# Patient Record
Sex: Male | Born: 1945 | Race: White | Hispanic: No | Marital: Married | Smoking: Former smoker
Health system: Southern US, Community
[De-identification: ages and names within clinical notes are randomized; demographics above are authoritative.]

## PROBLEM LIST (undated history)

## (undated) DIAGNOSIS — F32A Depression, unspecified: Secondary | ICD-10-CM

## (undated) DIAGNOSIS — C61 Malignant neoplasm of prostate: Secondary | ICD-10-CM

## (undated) DIAGNOSIS — I10 Essential (primary) hypertension: Secondary | ICD-10-CM

## (undated) DIAGNOSIS — M109 Gout, unspecified: Secondary | ICD-10-CM

## (undated) DIAGNOSIS — N442 Benign cyst of testis: Secondary | ICD-10-CM

## (undated) DIAGNOSIS — F329 Major depressive disorder, single episode, unspecified: Secondary | ICD-10-CM

## (undated) DIAGNOSIS — M1990 Unspecified osteoarthritis, unspecified site: Secondary | ICD-10-CM

## (undated) DIAGNOSIS — F0390 Unspecified dementia without behavioral disturbance: Secondary | ICD-10-CM

## (undated) DIAGNOSIS — F419 Anxiety disorder, unspecified: Secondary | ICD-10-CM

## (undated) DIAGNOSIS — B019 Varicella without complication: Secondary | ICD-10-CM

## (undated) DIAGNOSIS — Z87891 Personal history of nicotine dependence: Principal | ICD-10-CM

## (undated) HISTORY — DX: Personal history of nicotine dependence: Z87.891

## (undated) HISTORY — DX: Gout, unspecified: M10.9

## (undated) HISTORY — DX: Major depressive disorder, single episode, unspecified: F32.9

## (undated) HISTORY — DX: Unspecified dementia, unspecified severity, without behavioral disturbance, psychotic disturbance, mood disturbance, and anxiety: F03.90

## (undated) HISTORY — DX: Depression, unspecified: F32.A

## (undated) HISTORY — DX: Varicella without complication: B01.9

## (undated) HISTORY — PX: KNEE ARTHROSCOPY: SUR90

## (undated) HISTORY — DX: Unspecified osteoarthritis, unspecified site: M19.90

## (undated) HISTORY — DX: Benign cyst of testis: N44.2

## (undated) HISTORY — PX: NASAL SINUS SURGERY: SHX719

---

## 2004-04-06 ENCOUNTER — Inpatient Hospital Stay: Payer: Self-pay | Admitting: Unknown Physician Specialty

## 2006-07-13 ENCOUNTER — Ambulatory Visit: Payer: Self-pay | Admitting: Internal Medicine

## 2006-08-02 ENCOUNTER — Ambulatory Visit: Payer: Self-pay | Admitting: Internal Medicine

## 2006-09-02 ENCOUNTER — Ambulatory Visit: Payer: Self-pay | Admitting: Internal Medicine

## 2006-10-02 ENCOUNTER — Ambulatory Visit: Payer: Self-pay | Admitting: Internal Medicine

## 2006-10-12 ENCOUNTER — Ambulatory Visit: Payer: Self-pay | Admitting: Internal Medicine

## 2006-11-02 ENCOUNTER — Ambulatory Visit: Payer: Self-pay | Admitting: Internal Medicine

## 2006-12-03 ENCOUNTER — Ambulatory Visit: Payer: Self-pay | Admitting: Internal Medicine

## 2007-01-02 ENCOUNTER — Ambulatory Visit: Payer: Self-pay | Admitting: Internal Medicine

## 2007-01-04 ENCOUNTER — Ambulatory Visit: Payer: Self-pay | Admitting: Internal Medicine

## 2007-02-02 ENCOUNTER — Ambulatory Visit: Payer: Self-pay | Admitting: Internal Medicine

## 2007-03-04 ENCOUNTER — Ambulatory Visit: Payer: Self-pay | Admitting: Oncology

## 2007-03-29 ENCOUNTER — Ambulatory Visit: Payer: Self-pay | Admitting: Oncology

## 2007-04-04 ENCOUNTER — Ambulatory Visit: Payer: Self-pay | Admitting: Oncology

## 2007-05-05 ENCOUNTER — Ambulatory Visit: Payer: Self-pay | Admitting: Oncology

## 2007-05-31 ENCOUNTER — Ambulatory Visit: Payer: Self-pay | Admitting: Gastroenterology

## 2007-06-02 ENCOUNTER — Ambulatory Visit: Payer: Self-pay | Admitting: Oncology

## 2007-06-02 ENCOUNTER — Ambulatory Visit: Payer: Self-pay | Admitting: Internal Medicine

## 2007-07-03 ENCOUNTER — Ambulatory Visit: Payer: Self-pay | Admitting: Oncology

## 2007-07-03 ENCOUNTER — Ambulatory Visit: Payer: Self-pay | Admitting: Internal Medicine

## 2007-08-02 ENCOUNTER — Ambulatory Visit: Payer: Self-pay | Admitting: Oncology

## 2007-08-02 ENCOUNTER — Ambulatory Visit: Payer: Self-pay | Admitting: Internal Medicine

## 2007-09-02 ENCOUNTER — Ambulatory Visit: Payer: Self-pay | Admitting: Internal Medicine

## 2007-09-02 ENCOUNTER — Ambulatory Visit: Payer: Self-pay | Admitting: Oncology

## 2007-10-02 ENCOUNTER — Ambulatory Visit: Payer: Self-pay | Admitting: Oncology

## 2007-10-02 ENCOUNTER — Ambulatory Visit: Payer: Self-pay | Admitting: Internal Medicine

## 2007-11-02 ENCOUNTER — Ambulatory Visit: Payer: Self-pay | Admitting: Internal Medicine

## 2007-11-08 ENCOUNTER — Ambulatory Visit: Payer: Self-pay | Admitting: Internal Medicine

## 2007-12-03 ENCOUNTER — Ambulatory Visit: Payer: Self-pay | Admitting: Internal Medicine

## 2008-01-02 ENCOUNTER — Ambulatory Visit: Payer: Self-pay | Admitting: Internal Medicine

## 2008-02-02 ENCOUNTER — Ambulatory Visit: Payer: Self-pay | Admitting: Internal Medicine

## 2008-02-14 ENCOUNTER — Ambulatory Visit: Payer: Self-pay | Admitting: Internal Medicine

## 2008-03-03 ENCOUNTER — Ambulatory Visit: Payer: Self-pay | Admitting: Internal Medicine

## 2008-04-03 ENCOUNTER — Ambulatory Visit: Payer: Self-pay | Admitting: Internal Medicine

## 2008-05-04 ENCOUNTER — Ambulatory Visit: Payer: Self-pay | Admitting: Internal Medicine

## 2008-05-15 ENCOUNTER — Ambulatory Visit: Payer: Self-pay | Admitting: Internal Medicine

## 2008-06-01 ENCOUNTER — Ambulatory Visit: Payer: Self-pay | Admitting: Internal Medicine

## 2008-07-02 ENCOUNTER — Ambulatory Visit: Payer: Self-pay | Admitting: Internal Medicine

## 2008-08-01 ENCOUNTER — Ambulatory Visit: Payer: Self-pay | Admitting: Internal Medicine

## 2008-08-12 ENCOUNTER — Ambulatory Visit: Payer: Self-pay | Admitting: Internal Medicine

## 2008-09-01 ENCOUNTER — Ambulatory Visit: Payer: Self-pay | Admitting: Internal Medicine

## 2008-10-01 ENCOUNTER — Ambulatory Visit: Payer: Self-pay | Admitting: Internal Medicine

## 2008-10-13 ENCOUNTER — Ambulatory Visit: Payer: Self-pay | Admitting: Internal Medicine

## 2008-11-01 ENCOUNTER — Ambulatory Visit: Payer: Self-pay | Admitting: Internal Medicine

## 2008-12-02 ENCOUNTER — Ambulatory Visit: Payer: Self-pay | Admitting: Internal Medicine

## 2008-12-08 ENCOUNTER — Ambulatory Visit: Payer: Self-pay | Admitting: Internal Medicine

## 2009-01-01 ENCOUNTER — Ambulatory Visit: Payer: Self-pay | Admitting: Internal Medicine

## 2009-02-01 ENCOUNTER — Ambulatory Visit: Payer: Self-pay | Admitting: Internal Medicine

## 2009-02-02 ENCOUNTER — Ambulatory Visit: Payer: Self-pay | Admitting: Internal Medicine

## 2009-03-03 ENCOUNTER — Ambulatory Visit: Payer: Self-pay | Admitting: Internal Medicine

## 2009-04-03 LAB — HM COLONOSCOPY

## 2009-05-04 ENCOUNTER — Ambulatory Visit: Payer: Self-pay | Admitting: Internal Medicine

## 2009-05-05 ENCOUNTER — Ambulatory Visit: Payer: Self-pay | Admitting: Internal Medicine

## 2009-06-01 ENCOUNTER — Ambulatory Visit: Payer: Self-pay | Admitting: Internal Medicine

## 2010-01-13 ENCOUNTER — Ambulatory Visit: Payer: Self-pay | Admitting: Family Medicine

## 2010-05-03 NOTE — Assessment & Plan Note (Signed)
Summary: FLU SHOT/JBB  Nurse Visit   Immunizations Administered:  Influenza Vaccine:    Vaccine Type: FLUZONE    Site: right deltoid    Mfr: Sanofi Pasteur    Dose: 0.5 ml    Route: IM    Given by: Levonne Spiller EMT-P    Exp. Date: 09/13/2010    Lot #: [id number]    VIS given: 10/26/09 version given January 13, 2010.   Immunizations Administered:  Influenza Vaccine:    Vaccine Type: FLUZONE    Site: right deltoid    Mfr: Sanofi Pasteur    Dose: 0.5 ml    Route: IM    Given by: Levonne Spiller EMT-P    Exp. Date: 09/13/2010    Lot #: [id number]    VIS given: 10/26/09 version given January 13, 2010.  Flu Vaccine Consent Questions:    Do you have a history of severe allergic reactions to this vaccine? no    Any prior history of allergic reactions to egg and/or gelatin? no    Do you have a sensitivity to the preservative Thimersol? no    Do you have a past history of Guillan-Barre Syndrome? no    Do you currently have an acute febrile illness? no    Have you ever had a severe reaction to latex? no    Vaccine information given and explained to patient? yes

## 2011-01-14 ENCOUNTER — Ambulatory Visit: Payer: Self-pay | Admitting: Unknown Physician Specialty

## 2011-03-25 ENCOUNTER — Inpatient Hospital Stay: Payer: Self-pay | Admitting: Internal Medicine

## 2011-07-26 ENCOUNTER — Ambulatory Visit: Payer: Self-pay | Admitting: Urology

## 2011-12-06 ENCOUNTER — Ambulatory Visit (INDEPENDENT_AMBULATORY_CARE_PROVIDER_SITE_OTHER): Payer: PRIVATE HEALTH INSURANCE | Admitting: Internal Medicine

## 2011-12-06 ENCOUNTER — Encounter: Payer: Self-pay | Admitting: Internal Medicine

## 2011-12-06 VITALS — BP 120/70 | HR 62 | Temp 98.7°F | Ht 74.0 in | Wt 219.0 lb

## 2011-12-06 DIAGNOSIS — F329 Major depressive disorder, single episode, unspecified: Secondary | ICD-10-CM | POA: Insufficient documentation

## 2011-12-06 DIAGNOSIS — G589 Mononeuropathy, unspecified: Secondary | ICD-10-CM

## 2011-12-06 DIAGNOSIS — G629 Polyneuropathy, unspecified: Secondary | ICD-10-CM | POA: Insufficient documentation

## 2011-12-06 DIAGNOSIS — E785 Hyperlipidemia, unspecified: Secondary | ICD-10-CM

## 2011-12-06 DIAGNOSIS — F3289 Other specified depressive episodes: Secondary | ICD-10-CM

## 2011-12-06 DIAGNOSIS — F0390 Unspecified dementia without behavioral disturbance: Secondary | ICD-10-CM | POA: Insufficient documentation

## 2011-12-06 DIAGNOSIS — F32A Depression, unspecified: Secondary | ICD-10-CM | POA: Insufficient documentation

## 2011-12-06 DIAGNOSIS — Z23 Encounter for immunization: Secondary | ICD-10-CM

## 2011-12-06 LAB — COMPREHENSIVE METABOLIC PANEL
ALT: 34 U/L (ref 0–53)
AST: 32 U/L (ref 0–37)
Alkaline Phosphatase: 73 U/L (ref 39–117)
Chloride: 107 mEq/L (ref 96–112)
Creatinine, Ser: 1 mg/dL (ref 0.4–1.5)
Total Bilirubin: 0.7 mg/dL (ref 0.3–1.2)

## 2011-12-06 LAB — CBC WITH DIFFERENTIAL/PLATELET
Basophils Relative: 0.6 % (ref 0.0–3.0)
Eosinophils Relative: 6.3 % — ABNORMAL HIGH (ref 0.0–5.0)
Lymphocytes Relative: 30.7 % (ref 12.0–46.0)
Neutrophils Relative %: 56 % (ref 43.0–77.0)
Platelets: 169 10*3/uL (ref 150.0–400.0)
RBC: 4.73 Mil/uL (ref 4.22–5.81)
WBC: 6.8 10*3/uL (ref 4.5–10.5)

## 2011-12-06 LAB — LIPID PANEL
HDL: 52.8 mg/dL (ref >39.00)
Total CHOL/HDL Ratio: 3
Triglycerides: 322 mg/dL — ABNORMAL HIGH (ref 0.0–149.0)
VLDL: 64.4 mg/dL — ABNORMAL HIGH (ref 0.0–40.0)

## 2011-12-06 LAB — LDL CHOLESTEROL, DIRECT: Direct LDL: 76.3 mg/dL

## 2011-12-06 LAB — TSH: TSH: 1.3 u[IU]/mL (ref 0.35–5.50)

## 2011-12-06 MED ORDER — SERTRALINE HCL 100 MG PO TABS: 100.0000 mg | ORAL_TABLET | Freq: Every day | ORAL | Status: DC

## 2011-12-06 MED ORDER — DONEPEZIL HCL 5 MG PO TABS: 5.0000 mg | ORAL_TABLET | Freq: Every day | ORAL | Status: DC

## 2011-12-06 MED ORDER — MEMANTINE HCL 5 MG PO TABS: 5.0000 mg | ORAL_TABLET | Freq: Every day | ORAL | Status: DC

## 2011-12-06 NOTE — Assessment & Plan Note (Signed)
Burning pain bilateral feet L>R over last several months. Exam remarkable for diminished sensation to temperature distal foot.  Will check CBC, TSH, CMP, B12. Discussed heavy metal screen if initial labs normal. Also discussed potential referral to vascular for arterial doppler if labs unrevealing. Follow up 1 month.

## 2011-12-06 NOTE — Assessment & Plan Note (Signed)
Doing well on Namenda and Aricept. Will request records on previous evaluation. Follow up 1 month.

## 2011-12-06 NOTE — Progress Notes (Signed)
Subjective:    Patient ID: Ian Ross, male    DOB: 09/18/1945, 66 y.o.   MRN: [id number]  HPI 66 year old male with history of depression, dementia presents to establish care. He reports he is generally doing well. His primary concern today is several month history of burning pain in his distal feet and toes. He reports this is worse in his left leg compared to right. He does not have a history of diabetes. He has never been diagnosed with neuropathy. He does note that he uses a well for his water at home. This well has been evaluated for high iron levels. However, he tries to use bottled water when possible. He denies any discoloration of his legs, cramping in his legs, calf pain. He has not taken any medication for this.  In regards to depression and dementia, he reports symptoms are well controlled with medication. He notes that he was taking Abilify in the past but stopped this medication because of cost. He previously followed with a psychiatrist but has only been seen once a year for medication refills.  Outpatient Encounter Prescriptions as of 12/06/2011  Medication Sig Dispense Refill  . aspirin 81 MG tablet Take 81 mg by mouth daily.      Marland Kitchen donepezil (ARICEPT) 5 MG tablet Take 1 tablet (5 mg total) by mouth daily.  30 tablet  6  . memantine (NAMENDA) 5 MG tablet Take 1 tablet (5 mg total) by mouth daily.  30 tablet  6  . sertraline (ZOLOFT) 100 MG tablet Take 1 tablet (100 mg total) by mouth daily.  30 tablet  6   BP 120/70  Pulse 62  Temp 98.7 F (37.1 C) (Oral)  Ht 6\' 2"  (1.88 m)  Wt 219 lb (99.338 kg)  BMI 28.12 kg/m2  SpO2 98%  Review of Systems  Constitutional: Negative for fever, chills, activity change, appetite change, fatigue and unexpected weight change.  Eyes: Negative for visual disturbance.  Respiratory: Negative for cough and shortness of breath.   Cardiovascular: Negative for chest pain, palpitations and leg swelling.  Gastrointestinal: Negative for  abdominal pain and abdominal distention.  Genitourinary: Negative for dysuria, urgency and difficulty urinating.  Musculoskeletal: Positive for myalgias. Negative for arthralgias and gait problem.  Skin: Negative for color change and rash.  Neurological: Negative for seizures, weakness, numbness and headaches.  Hematological: Negative for adenopathy.  Psychiatric/Behavioral: Negative for disturbed wake/sleep cycle and dysphoric mood. The patient is not nervous/anxious.        Objective:   Physical Exam  Constitutional: He is oriented to person, place, and time. He appears well-developed and well-nourished. No distress.  HENT:  Head: Normocephalic and atraumatic.  Right Ear: External ear normal.  Left Ear: External ear normal.  Nose: Nose normal.  Mouth/Throat: Oropharynx is clear and moist. No oropharyngeal exudate.  Eyes: Conjunctivae and EOM are normal. Pupils are equal, round, and reactive to light. Right eye exhibits no discharge. Left eye exhibits no discharge. No scleral icterus.  Neck: Normal range of motion. Neck supple. No tracheal deviation present. No thyromegaly present.  Cardiovascular: Normal rate, regular rhythm and normal heart sounds.  Exam reveals no gallop and no friction rub.   No murmur heard. Pulmonary/Chest: Effort normal and breath sounds normal. No respiratory distress. He has no wheezes. He has no rales. He exhibits no tenderness.  Abdominal: Soft. Bowel sounds are normal. He exhibits no distension and no mass. There is no tenderness. There is no guarding.  Musculoskeletal: Normal range of  motion. He exhibits no edema.  Lymphadenopathy:    He has no cervical adenopathy.  Neurological: He is alert and oriented to person, place, and time. He has normal strength. A sensory deficit (decreased sensation to cold temp distal left foot) is present. No cranial nerve deficit. Coordination and gait normal.  Skin: Skin is warm and dry. No rash noted. He is not diaphoretic.  No erythema. No pallor.  Psychiatric: He has a normal mood and affect. His behavior is normal. Judgment and thought content normal.          Assessment & Plan:

## 2011-12-06 NOTE — Assessment & Plan Note (Signed)
Symptoms well controlled with Sertraline. Will continue. Follow up 1 month.

## 2011-12-07 ENCOUNTER — Telehealth: Payer: Self-pay | Admitting: Internal Medicine

## 2011-12-07 NOTE — Telephone Encounter (Signed)
Since we don't have vitamin b12 in can patient take oral vitamin b12?  If so how much?  Please advise.

## 2011-12-07 NOTE — Telephone Encounter (Signed)
Left message on machine at home advising patient as instructed. 

## 2011-12-07 NOTE — Telephone Encounter (Signed)
Yes, he can take B12 sublingual dissolvable tablets daily until we have injection available.

## 2011-12-07 NOTE — Telephone Encounter (Signed)
Patient is needing a prescription for his B-12. Wants to know how much to administer. Or if he could take the B-12 in a pill.

## 2011-12-11 LAB — LEAD, BLOOD: Lead-Whole Blood: 6.3 ug/dL (ref <25.0)

## 2012-01-03 ENCOUNTER — Ambulatory Visit (INDEPENDENT_AMBULATORY_CARE_PROVIDER_SITE_OTHER): Payer: Medicare Other | Admitting: Internal Medicine

## 2012-01-03 ENCOUNTER — Encounter: Payer: Self-pay | Admitting: Internal Medicine

## 2012-01-03 VITALS — BP 110/70 | HR 82 | Temp 97.8°F | Ht 74.0 in | Wt 216.2 lb

## 2012-01-03 DIAGNOSIS — E538 Deficiency of other specified B group vitamins: Secondary | ICD-10-CM

## 2012-01-03 DIAGNOSIS — G589 Mononeuropathy, unspecified: Secondary | ICD-10-CM

## 2012-01-03 DIAGNOSIS — G629 Polyneuropathy, unspecified: Secondary | ICD-10-CM

## 2012-01-03 MED ORDER — CYANOCOBALAMIN 1000 MCG/ML IJ SOLN: 1000.0000 ug | INTRAMUSCULAR | Status: DC

## 2012-01-03 NOTE — Assessment & Plan Note (Signed)
B12 deficiency noted on labs. Will start B12 replacement with 1000 mcg monthly IM.

## 2012-01-03 NOTE — Assessment & Plan Note (Signed)
Symptoms most likely secondary to B12 deficiency. Will start B12 supplementation. Patient will call if symptoms are not improving.

## 2012-01-03 NOTE — Progress Notes (Signed)
  Subjective:    Patient ID: STACIE KNUTZEN, male    DOB: 1945/05/21, 66 y.o.   MRN: [id number]  HPI 66 year old male with past of memory loss, depression presents for followup. At his last visit, he was concerned about burning pain in both of his lower extremities. Evaluation including lab work showed a low B12 level. He started oral supplement of 2000 mcg B12 daily. He has not started injectable B12 because there was a shortage of this medication. He reports that symptoms are unchanged.  Outpatient Encounter Prescriptions as of 01/03/2012  Medication Sig Dispense Refill  . aspirin 81 MG tablet Take 81 mg by mouth daily.      Marland Kitchen donepezil (ARICEPT) 5 MG tablet Take 1 tablet (5 mg total) by mouth daily.  30 tablet  6  . memantine (NAMENDA) 5 MG tablet Take 1 tablet (5 mg total) by mouth daily.  30 tablet  6  . sertraline (ZOLOFT) 100 MG tablet Take 1 tablet (100 mg total) by mouth daily.  30 tablet  6  . cyanocobalamin (,VITAMIN B-12,) 1000 MCG/ML injection Inject 1 mL (1,000 mcg total) into the muscle every 30 (thirty) days.  10 mL  1   BP 110/70  Pulse 82  Temp 97.8 F (36.6 C) (Oral)  Ht 6\' 2"  (1.88 m)  Wt 216 lb 4 oz (98.09 kg)  BMI 27.76 kg/m2  SpO2 97%  Review of Systems  Constitutional: Negative for fever, chills, activity change, appetite change, fatigue and unexpected weight change.  Eyes: Negative for visual disturbance.  Respiratory: Negative for cough and shortness of breath.   Cardiovascular: Negative for chest pain, palpitations and leg swelling.  Gastrointestinal: Negative for abdominal pain and abdominal distention.  Genitourinary: Negative for dysuria, urgency and difficulty urinating.  Musculoskeletal: Positive for myalgias. Negative for arthralgias and gait problem.  Skin: Negative for color change and rash.  Hematological: Negative for adenopathy.  Psychiatric/Behavioral: Negative for disturbed wake/sleep cycle and dysphoric mood. The patient is not  nervous/anxious.        Objective:   Physical Exam  Constitutional: He is oriented to person, place, and time. He appears well-developed and well-nourished. No distress.  HENT:  Head: Normocephalic and atraumatic.  Right Ear: External ear normal.  Left Ear: External ear normal.  Nose: Nose normal.  Mouth/Throat: Oropharynx is clear and moist. No oropharyngeal exudate.  Eyes: Conjunctivae normal and EOM are normal. Pupils are equal, round, and reactive to light. Right eye exhibits no discharge. Left eye exhibits no discharge. No scleral icterus.  Neck: Normal range of motion. Neck supple. No tracheal deviation present. No thyromegaly present.  Cardiovascular: Normal rate, regular rhythm and normal heart sounds.  Exam reveals no gallop and no friction rub.   No murmur heard. Pulmonary/Chest: Effort normal and breath sounds normal. No respiratory distress. He has no wheezes. He has no rales. He exhibits no tenderness.  Musculoskeletal: Normal range of motion. He exhibits no edema.  Lymphadenopathy:    He has no cervical adenopathy.  Neurological: He is alert and oriented to person, place, and time. No cranial nerve deficit. Coordination normal.  Skin: Skin is warm and dry. No rash noted. He is not diaphoretic. No erythema. No pallor.  Psychiatric: He has a normal mood and affect. His behavior is normal. Judgment and thought content normal.          Assessment & Plan:

## 2012-01-11 ENCOUNTER — Other Ambulatory Visit: Payer: Self-pay | Admitting: Internal Medicine

## 2012-01-16 ENCOUNTER — Ambulatory Visit (INDEPENDENT_AMBULATORY_CARE_PROVIDER_SITE_OTHER): Payer: Medicare Other | Admitting: Internal Medicine

## 2012-01-16 ENCOUNTER — Encounter: Payer: Self-pay | Admitting: Internal Medicine

## 2012-01-16 VITALS — BP 128/84 | HR 62 | Temp 97.9°F | Ht 74.0 in | Wt 216.8 lb

## 2012-01-16 DIAGNOSIS — H60399 Other infective otitis externa, unspecified ear: Secondary | ICD-10-CM

## 2012-01-16 DIAGNOSIS — H6691 Otitis media, unspecified, right ear: Secondary | ICD-10-CM | POA: Insufficient documentation

## 2012-01-16 DIAGNOSIS — H6690 Otitis media, unspecified, unspecified ear: Secondary | ICD-10-CM

## 2012-01-16 DIAGNOSIS — H6093 Unspecified otitis externa, bilateral: Secondary | ICD-10-CM | POA: Insufficient documentation

## 2012-01-16 DIAGNOSIS — Z23 Encounter for immunization: Secondary | ICD-10-CM

## 2012-01-16 MED ORDER — AMOXICILLIN-POT CLAVULANATE 875-125 MG PO TABS: 1.0000 | ORAL_TABLET | Freq: Two times a day (BID) | ORAL | Status: DC

## 2012-01-16 MED ORDER — CIPROFLOXACIN-DEXAMETHASONE 0.3-0.1 % OT SUSP: 4.0000 [drp] | Freq: Two times a day (BID) | OTIC | Status: DC | PRN

## 2012-01-16 NOTE — Progress Notes (Signed)
Subjective:    Patient ID: Ian Ross, male    DOB: 1945/08/05, 66 y.o.   MRN: [id number]  HPI 66 year old male presents for acute visit complaining of right ear pain and pressure. He denies any fever, chills. He reports some nasal congestion which is chronic for him. He denies any cough, sore throat, change in hearing. He has not taken any medication for this.  Outpatient Encounter Prescriptions as of 01/16/2012  Medication Sig Dispense Refill  . aspirin 81 MG tablet Take 81 mg by mouth daily.      . cyanocobalamin (,VITAMIN B-12,) 1000 MCG/ML injection Inject 1 mL (1,000 mcg total) into the muscle every 30 (thirty) days.  10 mL  1  . donepezil (ARICEPT) 5 MG tablet Take 1 tablet (5 mg total) by mouth daily.  30 tablet  6  . memantine (NAMENDA) 5 MG tablet Take 1 tablet (5 mg total) by mouth daily.  30 tablet  6  . sertraline (ZOLOFT) 100 MG tablet TAKE 1 TABLET BY MOUTH ONCE A DAY  90 tablet  2  . amoxicillin-clavulanate (AUGMENTIN) 875-125 MG per tablet Take 1 tablet by mouth 2 (two) times daily.  20 tablet  0  . ciprofloxacin-dexamethasone (CIPRODEX) otic suspension Place 4 drops into both ears 2 (two) times daily as needed.  7.5 mL  0  BP 128/84  Pulse 62  Temp 97.9 F (36.6 C)  Ht 6\' 2"  (1.88 m)  Wt 216 lb 12 oz (98.317 kg)  BMI 27.83 kg/m2  SpO2 96%   Review of Systems  Constitutional: Negative for fever, chills, activity change and fatigue.  HENT: Positive for ear pain and congestion. Negative for hearing loss, nosebleeds, sore throat, rhinorrhea, sneezing, trouble swallowing, neck pain, neck stiffness, voice change, postnasal drip, sinus pressure, tinnitus and ear discharge.   Eyes: Negative for discharge, redness, itching and visual disturbance.  Respiratory: Negative for cough, chest tightness, shortness of breath, wheezing and stridor.   Cardiovascular: Negative for chest pain and leg swelling.  Musculoskeletal: Negative for myalgias and arthralgias.  Skin: Negative  for color change and rash.  Neurological: Negative for dizziness, facial asymmetry and headaches.  Psychiatric/Behavioral: Negative for disturbed wake/sleep cycle.       Objective:   Physical Exam  Constitutional: He is oriented to person, place, and time. He appears well-developed and well-nourished. No distress.  HENT:  Head: Normocephalic and atraumatic.  Right Ear: External ear normal. Tympanic membrane is erythematous and bulging. A middle ear effusion is present.  Left Ear: External ear normal. Tympanic membrane is not erythematous and not bulging.  No middle ear effusion.  Nose: Nose normal.  Mouth/Throat: Oropharynx is clear and moist. No oropharyngeal exudate.       Bilateral ear canals with erythema and purulent exudate  Eyes: Conjunctivae normal and EOM are normal. Pupils are equal, round, and reactive to light. Right eye exhibits no discharge. Left eye exhibits no discharge. No scleral icterus.  Neck: Normal range of motion. Neck supple. No tracheal deviation present. No thyromegaly present.  Pulmonary/Chest: Effort normal.  Musculoskeletal: Normal range of motion. He exhibits no edema.  Lymphadenopathy:    He has no cervical adenopathy.  Neurological: He is alert and oriented to person, place, and time. No cranial nerve deficit. Coordination normal.  Skin: Skin is warm and dry. No rash noted. He is not diaphoretic. No erythema. No pallor.  Psychiatric: He has a normal mood and affect. His behavior is normal. Judgment and thought content normal.  Assessment & Plan:

## 2012-01-16 NOTE — Assessment & Plan Note (Signed)
Symptoms and exam most consistent with right OM. Will treat with augmentin. Pt will call if symptoms not improving over next 72hr.

## 2012-01-16 NOTE — Assessment & Plan Note (Signed)
Exam consistent with bilateral otitis externa. Will treat with topical ciprodex. Follow up prn.

## 2012-01-24 ENCOUNTER — Telehealth: Payer: Self-pay | Admitting: Internal Medicine

## 2012-01-24 DIAGNOSIS — H6690 Otitis media, unspecified, unspecified ear: Secondary | ICD-10-CM

## 2012-01-24 MED ORDER — PREDNISONE (PAK) 10 MG PO TABS: ORAL_TABLET | ORAL | Status: DC

## 2012-01-24 NOTE — Telephone Encounter (Signed)
She treated him with an oral antibiotic for the inner ear problem at topical antibiotic for the outer ear problem. The antibiotics that Dr. walker prescribed were very strong there is no stronger antibiotic for the ears. If he is not taking Sudafed PE or Sudafed along with the antibiotic to decongest his ears,  may still be having ear pain because of that. The only thing I can call him at this point would be prednisone six-day taper but I still want him to do a Sudafed or Sudafed PE as directed on the box

## 2012-01-24 NOTE — Telephone Encounter (Signed)
Pt was seen last week for ear ache and was given some antibiotics but pt say it hasn't worked he is still having ear pain. He was wondering if something else could be called into [facility] in [place].

## 2012-01-30 NOTE — Telephone Encounter (Signed)
Left message on machine for patient to return call if symptoms haven't improved or worsened.

## 2012-04-18 ENCOUNTER — Encounter: Payer: Self-pay | Admitting: Internal Medicine

## 2012-04-18 ENCOUNTER — Ambulatory Visit (INDEPENDENT_AMBULATORY_CARE_PROVIDER_SITE_OTHER): Payer: Medicare Other | Admitting: Internal Medicine

## 2012-04-18 VITALS — BP 140/80 | HR 71 | Temp 97.5°F | Ht 74.0 in | Wt 223.8 lb

## 2012-04-18 DIAGNOSIS — Z125 Encounter for screening for malignant neoplasm of prostate: Secondary | ICD-10-CM

## 2012-04-18 DIAGNOSIS — Z0000 Encounter for general adult medical examination without abnormal findings: Secondary | ICD-10-CM | POA: Insufficient documentation

## 2012-04-18 DIAGNOSIS — R238 Other skin changes: Secondary | ICD-10-CM

## 2012-04-18 DIAGNOSIS — F0390 Unspecified dementia without behavioral disturbance: Secondary | ICD-10-CM

## 2012-04-18 DIAGNOSIS — E785 Hyperlipidemia, unspecified: Secondary | ICD-10-CM

## 2012-04-18 DIAGNOSIS — Z1331 Encounter for screening for depression: Secondary | ICD-10-CM

## 2012-04-18 DIAGNOSIS — R209 Unspecified disturbances of skin sensation: Secondary | ICD-10-CM | POA: Insufficient documentation

## 2012-04-18 NOTE — Assessment & Plan Note (Signed)
General medical exam normal today except as noted. Discussed potential risk and benefits of screening for prostate cancer. Patient would like to screening with PSA testing. Will check lipids, CMP, and PSA today. Health maintenance is up to date. Appropriate screening performed. Recommended that patient set up health care power of attorney with his wife. Followup 3 months and prn.

## 2012-04-18 NOTE — Assessment & Plan Note (Signed)
Patient with exertional calf cramping and cold feet with faint DP pulses on exam. Question PVD. Will set up vascular evaluation with ABIs and arterial doppler.

## 2012-04-18 NOTE — Assessment & Plan Note (Signed)
Symptoms stable on current medications. Discussed potentially changing to long acting Namenda. Gave samples of starter pack for Namenda. Pt will think about this and decide if he would like to change.

## 2012-04-18 NOTE — Assessment & Plan Note (Signed)
Discussed benefits and risk of PSA screening. Pt would like to proceed with PSA testing. PSA sent today.

## 2012-04-18 NOTE — Progress Notes (Signed)
Subjective:    Patient ID: Ian Ross, male    DOB: 1945-08-23, 67 y.o.   MRN: [id number]  HPI The patient is here for annual Medicare wellness examination and management of other chronic and acute problems.   The risk factors are reflected in the social history.  The roster of all physicians providing medical care to patient - is listed in the Snapshot section of the chart.  Activities of daily living:  The patient is 100% independent in all ADLs: dressing, toileting, feeding as well as independent mobility  Home safety : The patient has smoke detectors in the home. They wear seatbelts.  There are no firearms at home. There is no violence in the home.   There is no risks for hepatitis, STDs or HIV. There is no history of blood transfusion. They have no travel history to infectious disease endemic areas of the world.  The patient has seen their dentist in the last six month. (Dr. Gerilyn Pilgrim) They have seen their eye doctor in the last year. [facility])  They have deferred audiologic testing in the last year. No issues with hearing.  They do not  have excessive sun exposure. Discussed the need for sun protection: hats, long sleeves and use of sunscreen if there is significant sun exposure. (Derm - Dr. Orson Aloe)  Diet: the importance of a healthy diet is discussed. They do have a healthy diet.  The benefits of regular aerobic exercise were discussed. He does not exercise.  Depression screen: there are no signs or vegative symptoms of depression- irritability, change in appetite, anhedonia, sadness/tearfullness.  Cognitive assessment: the patient manages all their financial and personal affairs and is actively engaged. They could relate day,date,year and events. Mild dementia noted on previous exam, and symptoms stable with Aricept and Namenda.  The following portions of the patient's history were reviewed and updated as appropriate: allergies, current medications, past family  history, past medical history,  past surgical history, past social history  and problem list.  No HCPOA in place.  Visual acuity was not assessed per patient preference since he has regular follow up with her ophthalmologist. Hearing and body mass index were assessed and reviewed.   During the course of the visit the patient was educated and counseled about appropriate screening and preventive services including : fall prevention , diabetes screening, nutrition counseling, colorectal cancer screening, and recommended immunizations.    Pt is concerned today about several month h/o bilateral cold feet and cramping in his calves on exertion.  He reports he has been wrapping his feet in several pairs of socks to help with symptoms. Denies wounds on his feet. Denies color change.   Outpatient Encounter Prescriptions as of 04/18/2012  Medication Sig Dispense Refill  . aspirin 81 MG tablet Take 81 mg by mouth daily.      . cyanocobalamin (,VITAMIN B-12,) 1000 MCG/ML injection Inject 1 mL (1,000 mcg total) into the muscle every 30 (thirty) days.  10 mL  1  . donepezil (ARICEPT) 5 MG tablet Take 1 tablet (5 mg total) by mouth daily.  30 tablet  6  . memantine (NAMENDA) 5 MG tablet Take 1 tablet (5 mg total) by mouth daily.  30 tablet  6  . sertraline (ZOLOFT) 100 MG tablet TAKE 1 TABLET BY MOUTH ONCE A DAY  90 tablet  2   BP 140/80  Pulse 71  Temp 97.5 F (36.4 C) (Oral)  Ht 6\' 2"  (1.88 m)  Wt 223 lb 12 oz (  101.492 kg)  BMI 28.73 kg/m2  SpO2 97%  Review of Systems  Constitutional: Negative for fever, chills, activity change, appetite change, fatigue and unexpected weight change.  Eyes: Negative for visual disturbance.  Respiratory: Negative for cough and shortness of breath.   Cardiovascular: Negative for chest pain, palpitations and leg swelling.  Gastrointestinal: Negative for abdominal pain and abdominal distention.  Genitourinary: Negative for dysuria, urgency and difficulty urinating.    Musculoskeletal: Positive for myalgias. Negative for arthralgias and gait problem.  Skin: Negative for color change and rash.  Hematological: Negative for adenopathy.  Psychiatric/Behavioral: Negative for sleep disturbance and dysphoric mood. The patient is not nervous/anxious.        Objective:   Physical Exam  Constitutional: He is oriented to person, place, and time. He appears well-developed and well-nourished. No distress.  HENT:  Head: Normocephalic and atraumatic.  Right Ear: External ear normal.  Left Ear: External ear normal.  Nose: Nose normal.  Mouth/Throat: Oropharynx is clear and moist. No oropharyngeal exudate.  Eyes: Conjunctivae normal and EOM are normal. Pupils are equal, round, and reactive to light. Right eye exhibits no discharge. Left eye exhibits no discharge. No scleral icterus.  Neck: Normal range of motion. Neck supple. No tracheal deviation present. No thyromegaly present.  Cardiovascular: Normal rate, regular rhythm and normal heart sounds.  Exam reveals no gallop and no friction rub.   No murmur heard. Pulses:      Dorsalis pedis pulses are 1+ on the right side, and 1+ on the left side.  Pulmonary/Chest: Effort normal and breath sounds normal. No respiratory distress. He has no wheezes. He has no rales. He exhibits no tenderness.  Abdominal: Soft. Bowel sounds are normal. He exhibits no distension. There is no tenderness. There is no rebound and no guarding.  Musculoskeletal: Normal range of motion. He exhibits no edema.  Lymphadenopathy:    He has no cervical adenopathy.  Neurological: He is alert and oriented to person, place, and time. No cranial nerve deficit. Coordination normal.  Skin: Skin is warm and dry. No rash noted. He is not diaphoretic. There is erythema (bilateral feet with delayed cap refill). No pallor.  Psychiatric: He has a normal mood and affect. His behavior is normal. Judgment and thought content normal.          Assessment &  Plan:

## 2012-04-19 ENCOUNTER — Encounter: Payer: Self-pay | Admitting: *Deleted

## 2012-04-19 LAB — PSA, MEDICARE: PSA: 3.89 ng/ml (ref 0.10–4.00)

## 2012-04-19 LAB — COMPREHENSIVE METABOLIC PANEL
Albumin: 4 g/dL (ref 3.5–5.2)
BUN: 10 mg/dL (ref 6–23)
CO2: 26 mEq/L (ref 19–32)
Calcium: 9.6 mg/dL (ref 8.4–10.5)
GFR: 74.44 mL/min (ref >60.00)
Glucose, Bld: 101 mg/dL — ABNORMAL HIGH (ref 70–99)
Potassium: 4.2 mEq/L (ref 3.5–5.1)
Sodium: 139 mEq/L (ref 135–145)
Total Protein: 7.4 g/dL (ref 6.0–8.3)

## 2012-07-22 ENCOUNTER — Ambulatory Visit: Payer: Medicare Other | Admitting: Internal Medicine

## 2012-09-28 ENCOUNTER — Other Ambulatory Visit: Payer: Self-pay | Admitting: Internal Medicine

## 2012-10-01 ENCOUNTER — Other Ambulatory Visit: Payer: Self-pay | Admitting: Internal Medicine

## 2012-10-01 ENCOUNTER — Telehealth: Payer: Self-pay | Admitting: Internal Medicine

## 2012-10-01 NOTE — Telephone Encounter (Signed)
donepezil (ARICEPT) 5 MG tablet  #90

## 2012-10-02 ENCOUNTER — Other Ambulatory Visit: Payer: Self-pay | Admitting: Internal Medicine

## 2012-10-02 NOTE — Telephone Encounter (Signed)
Already done

## 2012-11-04 ENCOUNTER — Other Ambulatory Visit: Payer: Self-pay | Admitting: Internal Medicine

## 2012-11-11 ENCOUNTER — Encounter: Payer: Self-pay | Admitting: Internal Medicine

## 2012-11-11 ENCOUNTER — Ambulatory Visit (INDEPENDENT_AMBULATORY_CARE_PROVIDER_SITE_OTHER): Payer: Medicare Other | Admitting: Internal Medicine

## 2012-11-11 VITALS — BP 130/70 | HR 78 | Temp 98.3°F | Wt 215.0 lb

## 2012-11-11 DIAGNOSIS — F329 Major depressive disorder, single episode, unspecified: Secondary | ICD-10-CM

## 2012-11-11 DIAGNOSIS — F32A Depression, unspecified: Secondary | ICD-10-CM

## 2012-11-11 DIAGNOSIS — F0390 Unspecified dementia without behavioral disturbance: Secondary | ICD-10-CM

## 2012-11-11 DIAGNOSIS — F3289 Other specified depressive episodes: Secondary | ICD-10-CM

## 2012-11-11 MED ORDER — DONEPEZIL HCL 5 MG PO TABS: ORAL_TABLET | ORAL | Status: DC

## 2012-11-11 MED ORDER — SERTRALINE HCL 100 MG PO TABS: ORAL_TABLET | ORAL | Status: DC

## 2012-11-11 NOTE — Assessment & Plan Note (Signed)
Symptoms well controlled on Sertraline alone. Pt was previously on Abilify but stopped because of cost of the medication. Will continue to monitor.

## 2012-11-11 NOTE — Assessment & Plan Note (Signed)
Symptoms stable with use of Aricept. Has not been taking Namenda because of cost, so will stop this medication. Will continue to monitor.

## 2012-11-11 NOTE — Addendum Note (Signed)
Addended by: Ronna Polio A on: 11/11/2012 07:07 PM   Modules accepted: Level of Service

## 2012-11-11 NOTE — Progress Notes (Signed)
  Subjective:    Patient ID: Ian Ross, male    DOB: 1945-04-09, 67 y.o.   MRN: [id number]  HPI  67 year old male with history of depression, memory loss presents for followup. He reports that symptoms have been well-controlled with current medications including sertraline and Aricept. He stopped taking Abilify and Namenda because of cost. He denies any side effects with this change. He has no new concerns today.  Outpatient Encounter Prescriptions as of 11/11/2012  Medication Sig Dispense Refill  . aspirin 81 MG tablet Take 81 mg by mouth daily.      . cyanocobalamin (,VITAMIN B-12,) 1000 MCG/ML injection Inject 1 mL (1,000 mcg total) into the muscle every 30 (thirty) days.  10 mL  1  . donepezil (ARICEPT) 5 MG tablet TAKE 1 TABLET BY MOUTH ONCE A DAY  90 tablet  4  . sertraline (ZOLOFT) 100 MG tablet TAKE 1 TABLET BY MOUTH EVERY DAY  90 tablet  4   No facility-administered encounter medications on file as of 11/11/2012.   BP 130/70  Pulse 78  Temp(Src) 98.3 F (36.8 C) (Oral)  Wt 215 lb (97.523 kg)  BMI 27.59 kg/m2  SpO2 97%  Review of Systems  Constitutional: Negative for fever, chills, activity change, appetite change, fatigue and unexpected weight change.  Eyes: Negative for visual disturbance.  Respiratory: Negative for cough and shortness of breath.   Cardiovascular: Negative for chest pain, palpitations and leg swelling.  Gastrointestinal: Negative for abdominal pain and abdominal distention.  Genitourinary: Negative for dysuria, urgency and difficulty urinating.  Musculoskeletal: Negative for arthralgias and gait problem.  Skin: Negative for color change and rash.  Hematological: Negative for adenopathy.  Psychiatric/Behavioral: Negative for sleep disturbance and dysphoric mood. The patient is not nervous/anxious.        Objective:   Physical Exam  Constitutional: He is oriented to person, place, and time. He appears well-developed and well-nourished. No distress.   HENT:  Head: Normocephalic and atraumatic.  Right Ear: External ear normal.  Left Ear: External ear normal.  Nose: Nose normal.  Mouth/Throat: Oropharynx is clear and moist. No oropharyngeal exudate.  Eyes: Conjunctivae and EOM are normal. Pupils are equal, round, and reactive to light. Right eye exhibits no discharge. Left eye exhibits no discharge. No scleral icterus.  Neck: Normal range of motion. Neck supple. No tracheal deviation present. No thyromegaly present.  Cardiovascular: Normal rate, regular rhythm and normal heart sounds.  Exam reveals no gallop and no friction rub.   No murmur heard. Pulmonary/Chest: Effort normal and breath sounds normal. No respiratory distress. He has no wheezes. He has no rales. He exhibits no tenderness.  Musculoskeletal: Normal range of motion. He exhibits no edema.  Lymphadenopathy:    He has no cervical adenopathy.  Neurological: He is alert and oriented to person, place, and time. No cranial nerve deficit. Coordination normal.  Skin: Skin is warm and dry. No rash noted. He is not diaphoretic. No erythema. No pallor.  Psychiatric: He has a normal mood and affect. His behavior is normal. Judgment and thought content normal.          Assessment & Plan:

## 2012-11-12 LAB — COMPREHENSIVE METABOLIC PANEL
BUN: 9 mg/dL (ref 6–23)
CO2: 26 mEq/L (ref 19–32)
Calcium: 9.5 mg/dL (ref 8.4–10.5)
Chloride: 106 mEq/L (ref 96–112)
Creatinine, Ser: 1 mg/dL (ref 0.4–1.5)
GFR: 80.48 mL/min (ref >60.00)
Glucose, Bld: 93 mg/dL (ref 70–99)
Total Bilirubin: 0.7 mg/dL (ref 0.3–1.2)

## 2012-11-14 ENCOUNTER — Other Ambulatory Visit: Payer: Self-pay | Admitting: Internal Medicine

## 2012-11-14 DIAGNOSIS — R7989 Other specified abnormal findings of blood chemistry: Secondary | ICD-10-CM

## 2012-12-12 ENCOUNTER — Other Ambulatory Visit (INDEPENDENT_AMBULATORY_CARE_PROVIDER_SITE_OTHER): Payer: Medicare Other

## 2012-12-12 ENCOUNTER — Other Ambulatory Visit: Payer: Self-pay | Admitting: *Deleted

## 2012-12-12 DIAGNOSIS — E538 Deficiency of other specified B group vitamins: Secondary | ICD-10-CM

## 2012-12-12 DIAGNOSIS — R7989 Other specified abnormal findings of blood chemistry: Secondary | ICD-10-CM

## 2012-12-12 LAB — HEPATIC FUNCTION PANEL
AST: 42 U/L — ABNORMAL HIGH (ref 0–37)
Alkaline Phosphatase: 63 U/L (ref 39–117)
Total Bilirubin: 1 mg/dL (ref 0.3–1.2)

## 2012-12-12 MED ORDER — CYANOCOBALAMIN 1000 MCG/ML IJ SOLN: 1000.0000 ug | INTRAMUSCULAR | Status: DC

## 2012-12-12 NOTE — Telephone Encounter (Signed)
Eprescribed.

## 2012-12-16 ENCOUNTER — Other Ambulatory Visit: Payer: Self-pay | Admitting: Internal Medicine

## 2012-12-16 DIAGNOSIS — R7989 Other specified abnormal findings of blood chemistry: Secondary | ICD-10-CM

## 2012-12-17 ENCOUNTER — Ambulatory Visit: Payer: Self-pay | Admitting: Internal Medicine

## 2012-12-20 ENCOUNTER — Telehealth: Payer: Self-pay | Admitting: Internal Medicine

## 2012-12-20 NOTE — Telephone Encounter (Signed)
The patient wanting his Ultra sound results .

## 2012-12-23 NOTE — Telephone Encounter (Signed)
I have not seen any results. We may have to request from the hospital.

## 2012-12-23 NOTE — Telephone Encounter (Signed)
Have you received his results?

## 2012-12-25 ENCOUNTER — Telehealth: Payer: Self-pay | Admitting: Internal Medicine

## 2012-12-25 NOTE — Telephone Encounter (Signed)
Pt calling again regarding [place] results.  States [facility] told him they were sent the next day.  Please contact pt

## 2012-12-25 NOTE — Telephone Encounter (Signed)
Pt calling back about hearing about his results ?? I let him know that Dr. Dan Humphreys still has not received the results on the 22nd but he insists that someone calls him.

## 2012-12-26 ENCOUNTER — Telehealth: Payer: Self-pay | Admitting: Internal Medicine

## 2012-12-26 NOTE — Telephone Encounter (Signed)
Received results today, gave to Dr. Dan Humphreys

## 2012-12-26 NOTE — Telephone Encounter (Signed)
Spoke with patient wife, she would like to know if this is something we are going to set up for him or does he need to back in to see you? Recommend follow up for liver in 3 months.  

## 2012-12-26 NOTE — Telephone Encounter (Addendum)
Recent right upper quadrant [place] was normal. We should just make sure he has follow up in 3 months or so.

## 2012-12-26 NOTE — Telephone Encounter (Signed)
I sent a message regarding this earlier today. [place] was normal. I would recommend that we repeat liver function tests in 3 months.

## 2012-12-26 NOTE — Telephone Encounter (Signed)
Informed patient wife of results and she agreed to give him information to her husband.

## 2012-12-26 NOTE — Telephone Encounter (Signed)
This encounter closed, please refer to next encounter for further information. Multiple encounters created for same issue.

## 2012-12-26 NOTE — Telephone Encounter (Signed)
Spoke with patient wife, she would like to know if this is something we are going to set up for him or does he need to back in to see you? Recommend follow up for liver in 3 months.

## 2012-12-26 NOTE — Telephone Encounter (Signed)
We just need to repeat CMP with labs in 3 months and set up a follow up visit after labs.

## 2012-12-27 ENCOUNTER — Encounter: Payer: Self-pay | Admitting: Internal Medicine

## 2012-12-27 NOTE — Telephone Encounter (Signed)
Informed patient and she verbally agreed understanding. Appt for labs scheduled.

## 2013-03-21 ENCOUNTER — Telehealth: Payer: Self-pay | Admitting: *Deleted

## 2013-03-21 NOTE — Telephone Encounter (Signed)
Pt is coming in for labs 12.22.2014, what labs and dx?

## 2013-03-21 NOTE — Telephone Encounter (Signed)
cmp 790.4

## 2013-03-24 ENCOUNTER — Other Ambulatory Visit (INDEPENDENT_AMBULATORY_CARE_PROVIDER_SITE_OTHER): Payer: Medicare Other

## 2013-03-24 ENCOUNTER — Telehealth: Payer: Self-pay | Admitting: *Deleted

## 2013-03-24 ENCOUNTER — Encounter: Payer: Self-pay | Admitting: *Deleted

## 2013-03-24 DIAGNOSIS — R7401 Elevation of levels of liver transaminase levels: Secondary | ICD-10-CM

## 2013-03-24 LAB — COMPREHENSIVE METABOLIC PANEL
ALT: 37 U/L (ref 0–53)
CO2: 24 mEq/L (ref 19–32)
Calcium: 9.4 mg/dL (ref 8.4–10.5)
Chloride: 104 mEq/L (ref 96–112)
Creatinine, Ser: 1 mg/dL (ref 0.4–1.5)
GFR: 81.35 mL/min (ref >60.00)
Glucose, Bld: 120 mg/dL — ABNORMAL HIGH (ref 70–99)
Total Bilirubin: 0.9 mg/dL (ref 0.3–1.2)
Total Protein: 7.3 g/dL (ref 6.0–8.3)

## 2013-03-24 NOTE — Telephone Encounter (Signed)
What labs and dx?  

## 2013-03-24 NOTE — Telephone Encounter (Signed)
Repeat LFTS 790.4

## 2013-05-15 ENCOUNTER — Encounter: Payer: Self-pay | Admitting: Internal Medicine

## 2013-05-15 ENCOUNTER — Encounter (INDEPENDENT_AMBULATORY_CARE_PROVIDER_SITE_OTHER): Payer: Self-pay

## 2013-05-15 ENCOUNTER — Ambulatory Visit (INDEPENDENT_AMBULATORY_CARE_PROVIDER_SITE_OTHER): Payer: Medicare HMO | Admitting: Internal Medicine

## 2013-05-15 VITALS — BP 132/70 | HR 86 | Temp 97.9°F | Ht 73.0 in | Wt 221.0 lb

## 2013-05-15 DIAGNOSIS — Z125 Encounter for screening for malignant neoplasm of prostate: Secondary | ICD-10-CM

## 2013-05-15 DIAGNOSIS — Z23 Encounter for immunization: Secondary | ICD-10-CM

## 2013-05-15 DIAGNOSIS — F0390 Unspecified dementia without behavioral disturbance: Secondary | ICD-10-CM

## 2013-05-15 DIAGNOSIS — Z0000 Encounter for general adult medical examination without abnormal findings: Secondary | ICD-10-CM

## 2013-05-15 LAB — COMPREHENSIVE METABOLIC PANEL
ALK PHOS: 83 U/L (ref 39–117)
ALT: 32 U/L (ref 0–53)
AST: 30 U/L (ref 0–37)
Albumin: 3.7 g/dL (ref 3.5–5.2)
BUN: 12 mg/dL (ref 6–23)
CO2: 22 mEq/L (ref 19–32)
Calcium: 9.3 mg/dL (ref 8.4–10.5)
Chloride: 109 mEq/L (ref 96–112)
Creatinine, Ser: 1 mg/dL (ref 0.4–1.5)
GFR: 77.61 mL/min (ref >60.00)
Glucose, Bld: 78 mg/dL (ref 70–99)
POTASSIUM: 4.3 meq/L (ref 3.5–5.1)
SODIUM: 140 meq/L (ref 135–145)
TOTAL PROTEIN: 6.9 g/dL (ref 6.0–8.3)
Total Bilirubin: 0.7 mg/dL (ref 0.3–1.2)

## 2013-05-15 LAB — CBC WITH DIFFERENTIAL/PLATELET
BASOS PCT: 0.9 % (ref 0.0–3.0)
Basophils Absolute: 0.1 10*3/uL (ref 0.0–0.1)
Eosinophils Absolute: 0.3 10*3/uL (ref 0.0–0.7)
Eosinophils Relative: 3.2 % (ref 0.0–5.0)
HCT: 46.3 % (ref 39.0–52.0)
Hemoglobin: 15.4 g/dL (ref 13.0–17.0)
Lymphocytes Relative: 22.4 % (ref 12.0–46.0)
Lymphs Abs: 1.9 10*3/uL (ref 0.7–4.0)
MCHC: 33.3 g/dL (ref 30.0–36.0)
MCV: 99.9 fl (ref 78.0–100.0)
Monocytes Absolute: 0.4 10*3/uL (ref 0.1–1.0)
Monocytes Relative: 4.9 % (ref 3.0–12.0)
Neutro Abs: 5.9 10*3/uL (ref 1.4–7.7)
Neutrophils Relative %: 68.6 % (ref 43.0–77.0)
Platelets: 185 10*3/uL (ref 150.0–400.0)
RBC: 4.64 Mil/uL (ref 4.22–5.81)
RDW: 13.4 % (ref 11.5–14.6)
WBC: 8.6 10*3/uL (ref 4.5–10.5)

## 2013-05-15 LAB — LIPID PANEL
CHOL/HDL RATIO: 3
Cholesterol: 165 mg/dL (ref 0–200)
HDL: 54.4 mg/dL (ref >39.00)
LDL CALC: 78 mg/dL (ref 0–99)
Triglycerides: 164 mg/dL — ABNORMAL HIGH (ref 0.0–149.0)
VLDL: 32.8 mg/dL (ref 0.0–40.0)

## 2013-05-15 LAB — PSA, MEDICARE: PSA: 4.11 ng/ml — ABNORMAL HIGH (ref 0.10–4.00)

## 2013-05-15 NOTE — Progress Notes (Signed)
Pre-visit discussion using our clinic review tool. No additional management support is needed unless otherwise documented below in the visit note.  

## 2013-05-15 NOTE — Assessment & Plan Note (Signed)
General medical exam normal today except as noted. Discussed potential risk and benefits of screening for prostate cancer. Patient would like to screening with PSA testing. Will check lipids, CMP, and PSA today. Health maintenance is up to date except for Prevnar which was given today. Appropriate screening performed. Recommended that patient set up health care power of attorney with his wife. Followup 6 months and prn.

## 2013-05-15 NOTE — Progress Notes (Signed)
Subjective:    Patient ID: Ian Ross, male    DOB: [birth year], 68 y.o.   MRN: [id number]  HPI The patient is here for annual Medicare wellness examination and management of other chronic and acute problems.   The risk factors are reflected in the social history.  The roster of all physicians providing medical care to patient - is listed in the Snapshot section of the chart.  Activities of daily living:  The patient is 100% independent in all ADLs: dressing, toileting, feeding as well as independent mobility. Lives with wife and dog and two cats.  Home safety : The patient has smoke detectors in the home. They wear seatbelts.  There are no firearms at home. There is no violence in the home.   There is no risks for hepatitis, STDs or HIV. There is no history of blood transfusion. They have no travel history to infectious disease endemic areas of the world.  The patient has seen their dentist in the last six month. [facility] They have seen their eye doctor in the last year. [facility])  They have deferred audiologic testing in the last year. No issues with hearing.  They do not  have excessive sun exposure. Discussed the need for sun protection: hats, long sleeves and use of sunscreen if there is significant sun exposure. (Derm - Dr. Koleen Nimrod)  Diet: the importance of a healthy diet is discussed. They do have a healthy diet.  The benefits of regular aerobic exercise were discussed. He does not exercise, but very active walking and using heavy equipment outside.  Depression screen: there are no signs or vegative symptoms of depression- irritability, change in appetite, anhedonia, sadness/tearfullness.  Cognitive assessment: the patient manages all their financial and personal affairs and is actively engaged. They could relate day,date,year and events. Mild dementia noted on previous exam, and symptoms stable with Aricept and Namenda.  The following portions of the  patient's history were reviewed and updated as appropriate: allergies, current medications, past family history, past medical history,  past surgical history, past social history  and problem list.  No HCPOA in place.  Visual acuity was not assessed per patient preference since he has regular follow up with her ophthalmologist. Hearing and body mass index were assessed and reviewed.   During the course of the visit the patient was educated and counseled about appropriate screening and preventive services including : fall prevention , diabetes screening, nutrition counseling, colorectal cancer screening, and recommended immunizations.     Outpatient Encounter Prescriptions as of 05/15/2013  Medication Sig  . aspirin 81 MG tablet Take 81 mg by mouth daily.  . cyanocobalamin (,VITAMIN B-12,) 1000 MCG/ML injection Inject 1 mL (1,000 mcg total) into the muscle every 30 (thirty) days.  Marland Kitchen donepezil (ARICEPT) 5 MG tablet TAKE 1 TABLET BY MOUTH ONCE A DAY  . sertraline (ZOLOFT) 100 MG tablet TAKE 1 TABLET BY MOUTH EVERY DAY   BP 132/70  Pulse 86  Temp(Src) 97.9 F (36.6 C) (Oral)  Ht 6\' 1"  (1.854 m)  Wt 221 lb (100.245 kg)  BMI 29.16 kg/m2  SpO2 95%   Review of Systems  Constitutional: Negative for fever, chills, activity change, appetite change, fatigue and unexpected weight change.  HENT: Negative for congestion, postnasal drip, rhinorrhea, trouble swallowing and voice change.   Eyes: Negative for visual disturbance.  Respiratory: Negative for cough and shortness of breath.   Cardiovascular: Negative for chest pain, palpitations and leg swelling.  Gastrointestinal:  Negative for nausea, vomiting, abdominal pain, diarrhea, constipation, abdominal distention and rectal pain.  Genitourinary: Negative for dysuria, urgency, decreased urine volume and difficulty urinating.  Musculoskeletal: Negative for arthralgias and gait problem.  Skin: Negative for color change and rash.  Neurological:  Negative for weakness, light-headedness and headaches.  Hematological: Negative for adenopathy.  Psychiatric/Behavioral: Negative for sleep disturbance and dysphoric mood. The patient is not nervous/anxious.        Objective:   Physical Exam  Constitutional: He is oriented to person, place, and time. He appears well-developed and well-nourished. No distress.  HENT:  Head: Normocephalic and atraumatic.  Right Ear: External ear normal.  Left Ear: External ear normal.  Nose: Nose normal.  Mouth/Throat: Oropharynx is clear and moist. No oropharyngeal exudate.  Eyes: Conjunctivae and EOM are normal. Pupils are equal, round, and reactive to light. Right eye exhibits no discharge. Left eye exhibits no discharge. No scleral icterus.  Neck: Normal range of motion. Neck supple. No tracheal deviation present. No thyromegaly present.  Cardiovascular: Normal rate, regular rhythm and normal heart sounds.  Exam reveals no gallop and no friction rub.   No murmur heard. Pulmonary/Chest: Effort normal and breath sounds normal. No accessory muscle usage. Not tachypneic. No respiratory distress. He has no decreased breath sounds. He has no wheezes. He has no rhonchi. He has no rales. He exhibits no tenderness.  Abdominal: Soft. Bowel sounds are normal. He exhibits no distension and no mass. There is no tenderness. There is no rebound and no guarding. A hernia is present. Hernia confirmed positive in the ventral area.  Musculoskeletal: Normal range of motion. He exhibits no edema.  Lymphadenopathy:    He has no cervical adenopathy.  Neurological: He is alert and oriented to person, place, and time. No cranial nerve deficit. Coordination normal.  Skin: Skin is warm and dry. No rash noted. He is not diaphoretic. No erythema. No pallor.  Psychiatric: He has a normal mood and affect. His behavior is normal. Judgment and thought content normal.          Assessment & Plan:

## 2013-05-15 NOTE — Assessment & Plan Note (Signed)
Symptoms stable on Aricept. Will continue.

## 2013-05-15 NOTE — Patient Instructions (Signed)
You are doing well.  Continue your efforts at healthy diet and regular exercise.  We will check labs today.  Follow up in 6 months or sooner as needed.

## 2013-05-16 ENCOUNTER — Telehealth: Payer: Self-pay | Admitting: Internal Medicine

## 2013-05-16 NOTE — Telephone Encounter (Signed)
Relevant patient education mailed to patient.  

## 2013-05-26 ENCOUNTER — Encounter: Payer: Self-pay | Admitting: *Deleted

## 2013-08-25 ENCOUNTER — Emergency Department: Payer: Self-pay | Admitting: Emergency Medicine

## 2013-11-14 ENCOUNTER — Encounter: Payer: Self-pay | Admitting: Internal Medicine

## 2013-11-14 ENCOUNTER — Ambulatory Visit (INDEPENDENT_AMBULATORY_CARE_PROVIDER_SITE_OTHER): Payer: Medicare HMO | Admitting: Internal Medicine

## 2013-11-14 VITALS — BP 120/68 | HR 70 | Temp 98.1°F | Ht 73.0 in | Wt 217.0 lb

## 2013-11-14 DIAGNOSIS — F0390 Unspecified dementia without behavioral disturbance: Secondary | ICD-10-CM

## 2013-11-14 DIAGNOSIS — F32A Depression, unspecified: Secondary | ICD-10-CM

## 2013-11-14 DIAGNOSIS — M25519 Pain in unspecified shoulder: Secondary | ICD-10-CM

## 2013-11-14 DIAGNOSIS — F3289 Other specified depressive episodes: Secondary | ICD-10-CM

## 2013-11-14 DIAGNOSIS — M25511 Pain in right shoulder: Secondary | ICD-10-CM

## 2013-11-14 DIAGNOSIS — R972 Elevated prostate specific antigen [PSA]: Secondary | ICD-10-CM | POA: Insufficient documentation

## 2013-11-14 DIAGNOSIS — F329 Major depressive disorder, single episode, unspecified: Secondary | ICD-10-CM

## 2013-11-14 MED ORDER — SERTRALINE HCL 100 MG PO TABS: ORAL_TABLET | ORAL | Status: DC

## 2013-11-14 MED ORDER — DONEPEZIL HCL 5 MG PO TABS: ORAL_TABLET | ORAL | Status: DC

## 2013-11-14 NOTE — Patient Instructions (Signed)
Labs today.  Wellness visit in 6 months.

## 2013-11-14 NOTE — Assessment & Plan Note (Signed)
No recent symptoms of depression. Will continue Sertraline.

## 2013-11-14 NOTE — Assessment & Plan Note (Signed)
Symptoms stable on Aricept. Will continue.

## 2013-11-14 NOTE — Progress Notes (Signed)
Subjective:    Patient ID: Ian Ross, male    DOB: 1945-11-26, 68 y.o.   MRN: [id number]  HPI 68YO male presents for follow up.  Right should pain - Recent increased stiffness in right shoulder with decreased ROM. Not taking anything for pain.  No recent symptoms of depression.  Review of Systems  Constitutional: Negative for fever, chills, activity change, appetite change, fatigue and unexpected weight change.  Eyes: Negative for visual disturbance.  Respiratory: Negative for cough and shortness of breath.   Cardiovascular: Negative for chest pain, palpitations and leg swelling.  Gastrointestinal: Negative for abdominal pain and abdominal distention.  Genitourinary: Negative for dysuria, urgency and difficulty urinating.  Musculoskeletal: Positive for arthralgias. Negative for gait problem.  Skin: Negative for color change and rash.  Hematological: Negative for adenopathy.  Psychiatric/Behavioral: Negative for sleep disturbance and dysphoric mood. The patient is not nervous/anxious.        Objective:    BP 120/68  Pulse 70  Temp(Src) 98.1 F (36.7 C) (Oral)  Ht 6\' 1"  (1.854 m)  Wt 217 lb (98.431 kg)  BMI 28.64 kg/m2  SpO2 97% Physical Exam  Constitutional: He is oriented to person, place, and time. He appears well-developed and well-nourished. No distress.  HENT:  Head: Normocephalic and atraumatic.  Right Ear: External ear normal.  Left Ear: External ear normal.  Nose: Nose normal.  Mouth/Throat: Oropharynx is clear and moist. No oropharyngeal exudate.  Eyes: Conjunctivae and EOM are normal. Pupils are equal, round, and reactive to light. Right eye exhibits no discharge. Left eye exhibits no discharge. No scleral icterus.  Neck: Normal range of motion. Neck supple. No tracheal deviation present. No thyromegaly present.  Cardiovascular: Normal rate, regular rhythm and normal heart sounds.  Exam reveals no gallop and no friction rub.   No murmur  heard. Pulmonary/Chest: Effort normal and breath sounds normal. No accessory muscle usage. Not tachypneic. No respiratory distress. He has no decreased breath sounds. He has no wheezes. He has no rhonchi. He has no rales. He exhibits no tenderness.  Musculoskeletal: He exhibits no edema.       Right shoulder: He exhibits decreased range of motion, pain and decreased strength (abduction).  Lymphadenopathy:    He has no cervical adenopathy.  Neurological: He is alert and oriented to person, place, and time. No cranial nerve deficit. Coordination normal.  Skin: Skin is warm and dry. No rash noted. He is not diaphoretic. No erythema. No pallor.  Psychiatric: He has a normal mood and affect. His behavior is normal. Judgment and thought content normal.          Assessment & Plan:   Problem List Items Addressed This Visit     Unprioritized   Dementia     Symptoms stable on Aricept. Will continue.    Relevant Medications      sertraline (ZOLOFT) tablet      donepezil (ARICEPT) tablet   Depression - Primary     No recent symptoms of depression. Will continue Sertraline.    Relevant Medications      sertraline (ZOLOFT) tablet   Elevated PSA      Lab Results  Component Value Date   PSA 4.11* 05/15/2013   PSA 3.89 04/18/2012   Mild elevation of PSA noted last check. No symptoms of change in urinary stream, urinary frequency or hesitancy. Will recheck PSA today.    Relevant Orders      PSA      Comprehensive metabolic panel  Right shoulder pain     Right shoulder decreased range of motion after injury many years ago. We discussed possible referral to sports medicine, but pt he declines.        Return in about 6 months (around 05/17/2014) for Wellness Visit.

## 2013-11-14 NOTE — Assessment & Plan Note (Signed)
Lab Results  Component Value Date   PSA 4.11* 05/15/2013   PSA 3.89 04/18/2012   Mild elevation of PSA noted last check. No symptoms of change in urinary stream, urinary frequency or hesitancy. Will recheck PSA today.

## 2013-11-14 NOTE — Assessment & Plan Note (Signed)
Right shoulder decreased range of motion after injury many years ago. We discussed possible referral to sports medicine, but pt he declines.

## 2013-11-14 NOTE — Progress Notes (Signed)
Pre visit review using our clinic review tool, if applicable. No additional management support is needed unless otherwise documented below in the visit note. 

## 2014-03-31 ENCOUNTER — Emergency Department: Payer: Self-pay | Admitting: Internal Medicine

## 2014-04-09 ENCOUNTER — Ambulatory Visit: Payer: Medicare HMO

## 2014-04-09 ENCOUNTER — Ambulatory Visit (INDEPENDENT_AMBULATORY_CARE_PROVIDER_SITE_OTHER): Payer: Medicare HMO

## 2014-04-09 ENCOUNTER — Telehealth: Payer: Self-pay | Admitting: Internal Medicine

## 2014-04-09 DIAGNOSIS — Z23 Encounter for immunization: Secondary | ICD-10-CM

## 2014-04-09 NOTE — Telephone Encounter (Signed)
Pt needs referral to be put in for Dr. Sharlet Salina. Pt has an appt on Monday 11th at 11am. Please advise pt/msn

## 2014-04-09 NOTE — Telephone Encounter (Signed)
I will ck his insurance  /no new referral needed

## 2014-04-09 NOTE — Telephone Encounter (Signed)
Do I need to place a new referral?

## 2014-04-20 ENCOUNTER — Ambulatory Visit: Payer: Self-pay | Admitting: Physical Medicine and Rehabilitation

## 2014-05-18 ENCOUNTER — Ambulatory Visit: Payer: Medicare HMO | Admitting: Internal Medicine

## 2014-05-20 ENCOUNTER — Encounter: Payer: Self-pay | Admitting: Internal Medicine

## 2014-05-20 ENCOUNTER — Ambulatory Visit (INDEPENDENT_AMBULATORY_CARE_PROVIDER_SITE_OTHER): Payer: Commercial Managed Care - HMO | Admitting: Internal Medicine

## 2014-05-20 VITALS — BP 143/76 | HR 67 | Temp 97.5°F | Ht 74.0 in | Wt 221.4 lb

## 2014-05-20 DIAGNOSIS — Z720 Tobacco use: Secondary | ICD-10-CM

## 2014-05-20 DIAGNOSIS — E538 Deficiency of other specified B group vitamins: Secondary | ICD-10-CM | POA: Diagnosis not present

## 2014-05-20 DIAGNOSIS — Z0000 Encounter for general adult medical examination without abnormal findings: Secondary | ICD-10-CM | POA: Diagnosis not present

## 2014-05-20 LAB — MICROALBUMIN / CREATININE URINE RATIO
CREATININE, U: 185.7 mg/dL
Microalb Creat Ratio: 0.6 mg/g (ref 0.0–30.0)
Microalb, Ur: 1.2 mg/dL (ref 0.0–1.9)

## 2014-05-20 LAB — CBC WITH DIFFERENTIAL/PLATELET
Basophils Absolute: 0.1 10*3/uL (ref 0.0–0.1)
Basophils Relative: 0.6 % (ref 0.0–3.0)
EOS PCT: 4.9 % (ref 0.0–5.0)
Eosinophils Absolute: 0.4 10*3/uL (ref 0.0–0.7)
HCT: 47.3 % (ref 39.0–52.0)
HEMOGLOBIN: 16.3 g/dL (ref 13.0–17.0)
Lymphocytes Relative: 28 % (ref 12.0–46.0)
Lymphs Abs: 2.5 10*3/uL (ref 0.7–4.0)
MCHC: 34.5 g/dL (ref 30.0–36.0)
MCV: 95.6 fl (ref 78.0–100.0)
MONOS PCT: 5.6 % (ref 3.0–12.0)
Monocytes Absolute: 0.5 10*3/uL (ref 0.1–1.0)
Neutro Abs: 5.4 10*3/uL (ref 1.4–7.7)
Neutrophils Relative %: 60.9 % (ref 43.0–77.0)
Platelets: 204 10*3/uL (ref 150.0–400.0)
RBC: 4.95 Mil/uL (ref 4.22–5.81)
RDW: 13 % (ref 11.5–15.5)
WBC: 8.9 10*3/uL (ref 4.0–10.5)

## 2014-05-20 LAB — COMPREHENSIVE METABOLIC PANEL
ALT: 49 U/L (ref 0–53)
AST: 29 U/L (ref 0–37)
Albumin: 4 g/dL (ref 3.5–5.2)
Alkaline Phosphatase: 85 U/L (ref 39–117)
BILIRUBIN TOTAL: 0.5 mg/dL (ref 0.2–1.2)
BUN: 16 mg/dL (ref 6–23)
CO2: 26 mEq/L (ref 19–32)
CREATININE: 1.19 mg/dL (ref 0.40–1.50)
Calcium: 9.5 mg/dL (ref 8.4–10.5)
Chloride: 105 mEq/L (ref 96–112)
GFR: 64.04 mL/min (ref >60.00)
GLUCOSE: 78 mg/dL (ref 70–99)
POTASSIUM: 4.4 meq/L (ref 3.5–5.1)
SODIUM: 140 meq/L (ref 135–145)
TOTAL PROTEIN: 7.1 g/dL (ref 6.0–8.3)

## 2014-05-20 LAB — VITAMIN D 25 HYDROXY (VIT D DEFICIENCY, FRACTURES): VITD: 27.25 ng/mL — ABNORMAL LOW (ref 30.00–100.00)

## 2014-05-20 LAB — LIPID PANEL
CHOL/HDL RATIO: 4
Cholesterol: 161 mg/dL (ref 0–200)
HDL: 44.5 mg/dL (ref >39.00)
NONHDL: 116.5
Triglycerides: 202 mg/dL — ABNORMAL HIGH (ref 0.0–149.0)
VLDL: 40.4 mg/dL — AB (ref 0.0–40.0)

## 2014-05-20 LAB — PSA, MEDICARE: PSA: 5.17 ng/ml — ABNORMAL HIGH (ref 0.10–4.00)

## 2014-05-20 LAB — LDL CHOLESTEROL, DIRECT: Direct LDL: 93 mg/dL

## 2014-05-20 MED ORDER — CYANOCOBALAMIN 1000 MCG/ML IJ SOLN: 1000.0000 ug | INTRAMUSCULAR | Status: DC

## 2014-05-20 NOTE — Assessment & Plan Note (Signed)
General medical exam normal today except as noted. Discussed potential risk and benefits of screening for prostate cancer. Patient would like to screening with PSA testing. Last PSA mildly elevated at 4.11, however pt did not return to scheduled follow up testing. Will check lipids, CMP, and PSA today. Health maintenance is up to date except CT chest screening for lung cancer. Will schedule. Appropriate screening performed. Recommended that patient set up health care power of attorney with his wife. Followup 6 months and prn.

## 2014-05-20 NOTE — Progress Notes (Signed)
Pre visit review using our clinic review tool, if applicable. No additional management support is needed unless otherwise documented below in the visit note. 

## 2014-05-20 NOTE — Patient Instructions (Signed)

## 2014-05-20 NOTE — Assessment & Plan Note (Signed)
Encouraged smoking cessation. Not ready to quit. Will set up screening CT chest for lung cancer.

## 2014-05-20 NOTE — Progress Notes (Signed)
Subjective:    Patient ID: Ian Ross, male    DOB: 1945/10/10, 69 y.o.   MRN: [id number]  HPI  The patient is here for annual Medicare wellness examination and management of other chronic and acute problems.   The risk factors are reflected in the social history.  The roster of all physicians providing medical care to patient - is listed in the Snapshot section of the chart.  Activities of daily living:  The patient is 100% independent in all ADLs: dressing, toileting, feeding as well as independent mobility. Lives with wife and dog and two cats.  Home safety : The patient has smoke detectors in the home. They wear seatbelts.  There are no firearms at home. There is no violence in the home.   There is no risks for hepatitis, STDs or HIV. There is no history of blood transfusion. They have no travel history to infectious disease endemic areas of the world.  The patient has seen their dentist in the last six month. [facility] They have seen their eye doctor in the last year. [facility])  They have deferred audiologic testing in the last year. No issues with hearing.  They do not  have excessive sun exposure. Discussed the need for sun protection: hats, long sleeves and use of sunscreen if there is significant sun exposure. (Derm - Dr. Koleen Nimrod, who is retiring)  Diet: the importance of a healthy diet is discussed. They do have a healthy diet.  The benefits of regular aerobic exercise were discussed. He does not exercise, but very active walking and using heavy equipment outside.  Depression screen: there are no signs or vegative symptoms of depression- irritability, change in appetite, anhedonia, sadness/tearfullness.  Cognitive assessment: the patient manages all their financial and personal affairs and is actively engaged. They could relate day,date,year and events. Mild dementia noted on previous exam, and symptoms stable with Aricept and Namenda.  The  following portions of the patient's history were reviewed and updated as appropriate: allergies, current medications, past family history, past medical history,  past surgical history, past social history  and problem list.  No HCPOA in place.  Visual acuity was not assessed per patient preference since he has regular follow up with her ophthalmologist. Hearing and body mass index were assessed and reviewed.   During the course of the visit the patient was educated and counseled about appropriate screening and preventive services including : fall prevention , diabetes screening, nutrition counseling, colorectal cancer screening, and recommended immunizations.      Past medical, surgical, family and social history per today's encounter.  Review of Systems  Constitutional: Negative for fever, chills, activity change, appetite change, fatigue and unexpected weight change.  HENT: Positive for congestion, postnasal drip and rhinorrhea. Negative for sore throat and trouble swallowing.   Eyes: Negative for visual disturbance.  Respiratory: Positive for cough. Negative for shortness of breath.   Cardiovascular: Negative for chest pain, palpitations and leg swelling.  Gastrointestinal: Negative for nausea, vomiting, abdominal pain, diarrhea, constipation and abdominal distention.  Genitourinary: Negative for dysuria, urgency and difficulty urinating.  Musculoskeletal: Negative for myalgias, arthralgias and gait problem.  Skin: Negative for color change and rash.  Hematological: Negative for adenopathy.  Psychiatric/Behavioral: Negative for sleep disturbance and dysphoric mood. The patient is not nervous/anxious.        Objective:    BP 143/76 mmHg  Pulse 67  Temp(Src) 97.5 F (36.4 C) (Oral)  Ht 6\' 2"  (1.88 m)  Wt 221 lb 6 oz (100.415 kg)  BMI 28.41 kg/m2  SpO2 97% Physical Exam  Constitutional: He is oriented to person, place, and time. He appears well-developed and well-nourished. No  distress.  HENT:  Head: Normocephalic and atraumatic.  Right Ear: External ear normal.  Left Ear: External ear normal.  Nose: Nose normal.  Mouth/Throat: Oropharynx is clear and moist. No oropharyngeal exudate.  Eyes: Conjunctivae and EOM are normal. Pupils are equal, round, and reactive to light. Right eye exhibits no discharge. Left eye exhibits no discharge. No scleral icterus.  Neck: Normal range of motion. Neck supple. No tracheal deviation present. No thyromegaly present.  Cardiovascular: Normal rate, regular rhythm and normal heart sounds.  Exam reveals no gallop and no friction rub.   No murmur heard. Pulmonary/Chest: Effort normal and breath sounds normal. No respiratory distress. He has no wheezes. He has no rales. He exhibits no tenderness.  Abdominal: Soft. Bowel sounds are normal. He exhibits no distension and no mass. There is no tenderness. There is no rebound and no guarding.  Musculoskeletal: Normal range of motion. He exhibits no edema.  Lymphadenopathy:    He has no cervical adenopathy.  Neurological: He is alert and oriented to person, place, and time. No cranial nerve deficit. Coordination normal.  Skin: Skin is warm and dry. No rash noted. He is not diaphoretic. No erythema. No pallor.  Psychiatric: He has a normal mood and affect. His behavior is normal. Judgment and thought content normal.          Assessment & Plan:   Problem List Items Addressed This Visit      Unprioritized   B12 deficiency   Relevant Medications   cyanocobalamin (,VITAMIN B-12,) 1000 MCG/ML injection   Medicare annual wellness visit, subsequent - Primary    General medical exam normal today except as noted. Discussed potential risk and benefits of screening for prostate cancer. Patient would like to screening with PSA testing. Last PSA mildly elevated at 4.11, however pt did not return to scheduled follow up testing. Will check lipids, CMP, and PSA today. Health maintenance is up to date  except CT chest screening for lung cancer. Will schedule. Appropriate screening performed. Recommended that patient set up health care power of attorney with his wife. Followup 6 months and prn.          Relevant Orders   CBC with Differential/Platelet   Comprehensive metabolic panel   Lipid panel   Microalbumin / creatinine urine ratio   Vit D  25 hydroxy (rtn osteoporosis monitoring)   PSA, Medicare   Tobacco abuse    Encouraged smoking cessation. Not ready to quit. Will set up screening CT chest for lung cancer.      Relevant Orders   CT CHEST LUNG CA SCREEN LOW DOSE W/O CM       Return in about 6 months (around 11/18/2014) for Recheck.

## 2014-05-21 ENCOUNTER — Telehealth: Payer: Self-pay | Admitting: Internal Medicine

## 2014-05-21 DIAGNOSIS — R972 Elevated prostate specific antigen [PSA]: Secondary | ICD-10-CM

## 2014-05-21 NOTE — Telephone Encounter (Signed)
-----   Message from Cecelia Byars, RN sent at 05/21/2014  9:50 AM EST ----- Called and advised patient of results,  verbalized understanding. Agreeable to referral, no preference. Advised [facility] would call once an appointment has been scheduled.

## 2014-05-27 ENCOUNTER — Telehealth: Payer: Self-pay | Admitting: *Deleted

## 2014-05-27 NOTE — Telephone Encounter (Signed)
Wife wanted to check on the status of Urology referral. Please contact patient once appt has been scheduled. I informed her it could take a few more days.

## 2014-06-05 ENCOUNTER — Ambulatory Visit
Admit: 2014-06-05 | Disposition: A | Discharge status: Home / Self Care | Payer: Self-pay | Attending: Family Medicine | Admitting: Family Medicine

## 2014-06-05 ENCOUNTER — Ambulatory Visit: Payer: Self-pay | Admitting: Family Medicine

## 2014-07-03 ENCOUNTER — Ambulatory Visit
Admit: 2014-07-03 | Disposition: A | Discharge status: Home / Self Care | Payer: Self-pay | Attending: Family Medicine | Admitting: Family Medicine

## 2014-07-26 NOTE — Consult Note (Signed)
PATIENT NAME:  Ian Ross, Ian Ross MR#:  [id number] DATE OF BIRTH:  04-21-1945  DATE OF CONSULTATION:  03/26/2011  REFERRING PHYSICIAN:   CONSULTING PHYSICIAN:  Lupita Dawn. Jaceyon Strole, MD  REASON FOR REFERRAL: Abdominal pain and diarrhea.   HISTORY OF PRESENT ILLNESS: The patient is a 69 year old white male who came to the emergency room with a few days of nausea and low abdominal and right-sided abdominal pain with some urgency and some diarrhea, which was nonbloody. He denied having fevers or chills. He tired taking some pain medication at home with some relief, but not complete. There is no vomiting. There are no fevers or chills. There is no recent travel or eating anything unusual.   In the emergency room, the patient had labs drawn which were negative and CT was negative, but because of the persistence of symptoms despite some of the medication the patient was admitted basically for observation.   Today the pain is gone and the diarrhea has resolved. He is tolerating a liquid diet okay without any problems.   PAST MEDICAL HISTORY:  1. Secondary polycythemia.  2. Mild dementia. 3. Chronic obstructive pulmonary disease. 4. Hyperlipidemia. 5. Depression.  6. Colonoscopy in 2009 showed either a leiomyoma or fibroma in the distal colon.  PAST SURGICAL HISTORY:  1. Knee surgery. 2. Sinus surgery.   ALLERGIES: Codeine.   HOME MEDICATIONS: Advair inhaler, Zoloft, NSAIDs to include diclofenac and etodolac, Abilify, memantine, and Aricept.   FAMILY HISTORY: There is no diabetes, stroke or heart attack in the family.   SOCIAL HISTORY: He drinks alcohol rarely. He quit smoking about three years ago.   REVIEW OF SYSTEMS: No fevers or chills. There was some nausea but no vomiting. There is no visual or hearing change. There is no chest pain or palpitations. There is no coughing or shortness of breath. GI symptoms have been mentioned already. He denies any gross hematochezia or melena. There is no  hematemesis. He has chronic back pain.   PHYSICAL EXAMINATION:   GENERAL: The patient is in no acute distress right now.   VITALS: Temperature 97.4, pulse 57, respirations 18, blood pressure 132/77, and pulse oximetry 98% on room air.   HEENT: Normocephalic, atraumatic head. Pupils are equally reactive. Throat was clear.   NECK: Supple.   HEART: Regular rhythm and rate without any murmurs.   LUNGS: Clear bilaterally.   ABDOMEN: Soft with normoactive bowel sounds. He points to the right side where he had some tenderness. He does not feel tender right now. There is no hepatomegaly.   EXTREMITIES: No clubbing, cyanosis, or edema.   LABS/STUDIES: Today his sodium is 144, potassium 3.6, chloride 109, BUN 5, creatinine 1.09, and glucose is a little elevated at 101. Liver enzymes were normal yesterday, except for AST of 42. White count again is normal at 5.7, hemoglobin 14.6, and platelet count is 138.   Stool tests were all negative.   Urinalysis is negative.   CT of the abdomen and pelvis was negative.  ASSESSMENT AND PLAN: This is a patient who came in with few day history of nausea, abdominal pain, and diarrhea with an essentially negative work-up. He had a colonoscopy a few years ago so he does not need to have one again unless symptoms worsen. He is clinically better. He is okay. His diet will be advanced. If he tolerates it he will be discharged to home. Perhaps he has some sort of gastroenteritis that is resolving. Thank you for the referral.  ____________________________ Eddie Dibbles  Johnell Comings, MD pyo:slb D: 03/26/2011 11:46:03 ET T: 03/26/2011 13:43:50 ET JOB#: [id number]  cc: Lupita Dawn. Candace Cruise, MD, <Dictator> Lupita Dawn Larwence Tu MD ELECTRONICALLY SIGNED 04/05/2011 8:54

## 2014-07-26 NOTE — Discharge Summary (Signed)
PATIENT NAME:  Ian Ross, Ian Ross MR#:  [id number] DATE OF BIRTH:  Nov 21, 1945  DATE OF ADMISSION:  03/25/2011 DATE OF DISCHARGE:  03/26/2011  DIAGNOSES AT TIME OF DISCHARGE:  1. Nausea, diarrhea and abdominal pain, possible colitis.  2. Chronic obstructive pulmonary disease.  3. History of hypertension.  4. History of depression, anxiety and mild dementia.   CHIEF COMPLAINT: Abdominal pain, nausea.   HISTORY OF PRESENT ILLNESS: Ian Ross is a 69 year old with history of chronic back pain, chronic obstructive pulmonary disease, hyperlipidemia, depression, mild dementia who presented to the ER with nausea and some abdominal pain and also said that he had been having some diarrhea associated with this.   PAST MEDICAL HISTORY: 1. 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 2. Back pain.  3. Chronic obstructive pulmonary disease. 4. Hyperlipidemia. 5. Urinary outflow obstruction. 6. Depression, anxiety.  7. Previous colonoscopy in 2009, which showed fibroma versus leiomyoma in the distal transverse colon. 8. Previous sinus surgery.  9. Left knee surgery.   ALLERGIES: Codeine.    PHYSICAL EXAMINATION: VITAL SIGNS: Patient denies any fever or chills. Afebrile, pulse 72, respirations 18, blood pressure 180/90, oxygen saturation 100% on room air. Repeat blood pressure was 136/72. GENERAL: He was obese in mild distress. HEENT: Normocephalic, atraumatic. HEART: S1, S2. LUNGS: Clear to auscultation. ABDOMEN: Soft. Tenderness noted mainly in the lower part of the abdomen in right and left lower quadrants. EXTREMITIES: No edema. NEUROLOGIC: Nonfocal.   LABORATORY, DIAGNOSTIC AND RADIOLOGICAL DATA: Abdominal x-ray three way did not show any evidence of obstruction. CT scan of the abdomen and pelvis showed no acute findings. There was calcification of the arteries compared with change of atherosclerotic vascular disease. WBC count 6.1, hemoglobin 16.4, hematocrit 47.9, platelets 153,000, lipase 126, glucose  114, BUN 8, creatinine 1.16, sodium 142, potassium 3.6, ALT 61, ALT 42, total protein 7.7, albumin 3.8. EKG showed sinus rhythm and no acute ST-T changes.   HOSPITAL COURSE: Patient was admitted to [facility], was started on IV Cipro and Flagyl. He was also seen in consultation by Dr. Candace Cruise, gastroenterologist, by which time his symptoms had significantly abated and his diet was advanced and patient felt well and was keen to go home. He was discharged home on the following medications.   DISCHARGE MEDICATIONS:  1. Cipro 500 mg p.o. b.i.d. for one week.  2. Flagyl 500 mg p.o. t.i.d. for one week.   HOME MEDICATIONS: Advised to continue his home medications including:  1. Abilify 5 mg a day.  2. Aspirin 81 mg a day.  3. Aricept 5 mg a day. 4. Memantine 5 mg a day.   FOLLOW UP: Patient has been advised to keep his follow-up appointment with Dr. Sherald Barge.   ____________________________ Tracie Harrier, MD vh:cms D: 03/27/2011 11:43:54 ET T: 03/29/2011 10:54:28 ET JOB#: [id number]  cc: Tracie Harrier, MD, <Dictator>  Tracie Harrier MD ELECTRONICALLY SIGNED 04/11/2011 17:37

## 2014-07-28 ENCOUNTER — Other Ambulatory Visit: Payer: Self-pay | Admitting: Urology

## 2014-07-28 DIAGNOSIS — C61 Malignant neoplasm of prostate: Secondary | ICD-10-CM

## 2014-08-01 LAB — PSA: PSA: 5.17

## 2014-08-13 ENCOUNTER — Encounter: Payer: Self-pay | Admitting: Radiation Oncology

## 2014-08-13 ENCOUNTER — Ambulatory Visit
Admission: RE | Admit: 2014-08-13 | Discharge: 2014-08-13 | Disposition: A | Discharge status: Home / Self Care | Payer: Commercial Managed Care - HMO | Source: Ambulatory Visit | Attending: Radiation Oncology | Admitting: Radiation Oncology

## 2014-08-13 VITALS — BP 121/62 | HR 69 | Temp 96.6°F | Resp 20 | Ht 74.0 in | Wt 211.6 lb

## 2014-08-13 DIAGNOSIS — C61 Malignant neoplasm of prostate: Secondary | ICD-10-CM | POA: Insufficient documentation

## 2014-08-13 DIAGNOSIS — Z510 Encounter for antineoplastic radiation therapy: Secondary | ICD-10-CM | POA: Insufficient documentation

## 2014-08-13 HISTORY — DX: Malignant neoplasm of prostate: C61

## 2014-08-13 NOTE — Progress Notes (Signed)
Met with Ian Ross and his wife along with Dr Baruch Gouty in exam room. Introduced Art therapist as a resource to make his care less complicated. Provided him with my contact information and encouraged him to call with any questions or concerns.

## 2014-08-13 NOTE — Consult Note (Signed)
Radiation Oncology NEW PATIENT EVALUATION  Name: Ian Ross  MRN: [id number]  Date:   08/13/2014     DOB: 06-19-1945   This 69 y.o. 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  REFERRING PHYSICIAN: Jackolyn Confer, MD  CHIEF COMPLAINT:  Chief Complaint  Patient presents with  . Prostate Cancer    Initial consultation of prostate cancer    DIAGNOSIS: The encounter diagnosis was Prostate cancer.   PREVIOUS INVESTIGATIONS:  No bone scan ordered as appropriate for PSA level Pathology report reviewed Clinical notes reviewed  HPI: Patient is a 69 year old male with slight dementia presented to his PMD with a PSA of 5 prompting referral to urology. Patient underwent a 12 core biopsy of his prostate showing mostly left-sided confined disease with 7 of 12 cores positive biopsy-positive for adenocarcinoma highest score being Gleason 7 (4+3). Patient is very little lower urinary tract symptoms. Specifically denies any diarrhea dysuria or prior abdominal surgery. Treatment options have been discussed with the patient by urology. According to the [facility] nomogram he has 70% chance of extracapsular extension 12% chance of lymph node involvement and a 13% chance of seminal vesicle invasion. Patient has reviewed his options is leaning towards external beam treatment.  PLANNED TREATMENT REGIMEN: Image guided I MRT radiation therapy  PAST MEDICAL HISTORY:  has a past medical history of Chicken pox; Depression; Dementia; Testicular cyst; Arthritis; Gout; and Prostate cancer.    PAST SURGICAL HISTORY:  Past Surgical History  Procedure Laterality Date  . Knee arthroscopy      Dr. Tamala Julian    FAMILY HISTORY: family history includes Cancer in his father and mother; Heart disease in his father and mother.  SOCIAL HISTORY:  reports that he has been smoking.  He uses  smokeless tobacco. He reports that he does not drink alcohol or use illicit drugs.  ALLERGIES: Codeine  MEDICATIONS:  Current Outpatient Prescriptions  Medication Sig Dispense Refill  . aspirin 81 MG tablet Take 81 mg by mouth daily.    . colchicine 0.6 MG tablet Take 1 tablet by mouth daily as needed.     . cyanocobalamin (,VITAMIN B-12,) 1000 MCG/ML injection Inject 1 mL (1,000 mcg total) into the muscle every 30 (thirty) days. 10 mL 1  . donepezil (ARICEPT) 5 MG tablet TAKE 1 TABLET BY MOUTH ONCE A DAY 90 tablet 4  . sertraline (ZOLOFT) 100 MG tablet TAKE 1 TABLET BY MOUTH EVERY DAY 90 tablet 4  . oxyCODONE-acetaminophen (PERCOCET) 10-325 MG per tablet   0  . predniSONE (DELTASONE) 20 MG tablet See admin instructions.  0   No current facility-administered medications for this encounter.    ECOG PERFORMANCE STATUS:  0 - Asymptomatic  REVIEW OF SYSTEMS:  Patient denies any weight loss, fatigue, weakness, fever, chills or night sweats. Patient denies any loss of vision, blurred vision. Patient denies any ringing  of the ears or hearing loss. No irregular heartbeat. Patient denies heart murmur or history of fainting. Patient denies any chest pain or pain radiating to her upper extremities. Patient denies any shortness of breath, difficulty breathing at night, cough or hemoptysis. Patient denies any swelling in the lower legs. Patient denies any nausea vomiting, vomiting of blood, or coffee ground material in the vomitus. Patient denies any stomach pain. Patient states has had normal bowel movements no significant constipation or diarrhea. Patient denies any dysuria,  hematuria or significant nocturia. Patient denies any problems walking, swelling in the joints or loss of balance. Patient denies any skin changes, loss of hair or loss of weight. Patient denies any excessive worrying or anxiety or significant depression. Patient denies any problems with insomnia. Patient denies excessive thirst,  polyuria, polydipsia. Patient denies any swollen glands, patient denies easy bruising or easy bleeding. Patient denies any recent infections, allergies or URI. Patient "s visual fields have not changed significantly in recent time.    PHYSICAL EXAM: BP 121/62 mmHg  Pulse 69  Temp(Src) 96.6 F (35.9 C)  Resp 20  Ht '6\' 2"'$  (1.88 m)  Wt 211 lb 10.3 oz (96 kg)  BMI 27.16 kg/m2 Well-developed male in NAD. Lungs are clear to A&P cardiac examination shows regular rate and rhythm. On rectal exam rectal sphincter tone is good. Prostate is smooth without evidence of nodularity or mass sulcus is preserved bilaterally. No other rectal abnormalities identified. Well-developed well-nourished patient in NAD. HEENT reveals PERLA, EOMI, discs not visualized.  Oral cavity is clear. No oral mucosal lesions are identified. Neck is clear without evidence of cervical or supraclavicular adenopathy. Lungs are clear to A&P. Cardiac examination is essentially unremarkable with regular rate and rhythm without murmur rub or thrill. Abdomen is benign with no organomegaly or masses noted. Motor sensory and DTR levels are equal and symmetric in the upper and lower extremities. Cranial nerves II through XII are grossly intact. Proprioception is intact. No peripheral adenopathy or edema is identified. No motor or sensory levels are noted. Crude visual fields are within normal range.   LABORATORY DATA:  No results found for this or any previous visit (from the past 72 hour(s)).   RADIOLOGY RESULTS: No results found.  IMPRESSION: Stage IIa adenocarcinoma the prostate Gleason score of 7 (71+38) in 69 year old male for image guided IM RT radiation therapy  PLAN: At this time I once again treatment recommendations with the patient including surgery and radiation therapy. I agree is a high chance of extracapsular extension close to 70% and in the risks and benefits of surgical resection were made click quite clear to the patient. He  has requested radiation therapy external beam treatment. I would plan on delivering 8000 cGy to his prostate volume using image guided technique. I've asked Dr. Erlene Quan to place gold fiduciary markers in the prostate for daily image guided treatment. Risks and benefits of treatment including increased lower urinary tract symptoms including urgency frequency and nocturia, possible diarrhea, possible alteration of blood counts, fatigue, and skin changes all were explained in detail to the patient and his wife. Both seem to comprehend my treatment plan well. I have set up and ordered CT simulation shortly after gold markers are placed.  I would like to take this opportunity for allowing me to participate in the care of your patient.Armstead Peaks., MD

## 2014-08-18 ENCOUNTER — Other Ambulatory Visit: Payer: Self-pay | Admitting: Urology

## 2014-08-18 DIAGNOSIS — C61 Malignant neoplasm of prostate: Secondary | ICD-10-CM

## 2014-08-19 ENCOUNTER — Ambulatory Visit
Admission: RE | Admit: 2014-08-19 | Discharge: 2014-08-19 | Disposition: A | Discharge status: Home / Self Care | Payer: Commercial Managed Care - HMO | Source: Ambulatory Visit | Attending: Urology | Admitting: Urology

## 2014-08-19 DIAGNOSIS — C61 Malignant neoplasm of prostate: Secondary | ICD-10-CM

## 2014-08-19 MED ORDER — IOHEXOL 350 MG/ML SOLN
100.0000 mL | Freq: Once | INTRAVENOUS | Status: AC | PRN
  Administered 2014-08-19: 100 mL via INTRAVENOUS

## 2014-09-07 ENCOUNTER — Ambulatory Visit
Admission: RE | Admit: 2014-09-07 | Discharge: 2014-09-07 | Disposition: A | Discharge status: Home / Self Care | Payer: Commercial Managed Care - HMO | Source: Ambulatory Visit | Attending: Radiation Oncology | Admitting: Radiation Oncology

## 2014-09-07 DIAGNOSIS — Z510 Encounter for antineoplastic radiation therapy: Secondary | ICD-10-CM | POA: Diagnosis not present

## 2014-09-07 DIAGNOSIS — C61 Malignant neoplasm of prostate: Secondary | ICD-10-CM | POA: Diagnosis not present

## 2014-09-08 DIAGNOSIS — Z510 Encounter for antineoplastic radiation therapy: Secondary | ICD-10-CM | POA: Diagnosis not present

## 2014-09-10 DIAGNOSIS — Z510 Encounter for antineoplastic radiation therapy: Secondary | ICD-10-CM | POA: Diagnosis not present

## 2014-09-11 ENCOUNTER — Other Ambulatory Visit: Payer: Self-pay | Admitting: *Deleted

## 2014-09-11 DIAGNOSIS — C61 Malignant neoplasm of prostate: Secondary | ICD-10-CM

## 2014-09-16 ENCOUNTER — Ambulatory Visit
Admission: RE | Admit: 2014-09-16 | Discharge: 2014-09-16 | Disposition: A | Discharge status: Home / Self Care | Payer: Commercial Managed Care - HMO | Source: Ambulatory Visit | Attending: Radiation Oncology | Admitting: Radiation Oncology

## 2014-09-16 DIAGNOSIS — Z510 Encounter for antineoplastic radiation therapy: Secondary | ICD-10-CM | POA: Diagnosis not present

## 2014-09-17 ENCOUNTER — Ambulatory Visit
Admission: RE | Admit: 2014-09-17 | Discharge: 2014-09-17 | Disposition: A | Discharge status: Home / Self Care | Payer: Commercial Managed Care - HMO | Source: Ambulatory Visit | Attending: Radiation Oncology | Admitting: Radiation Oncology

## 2014-09-17 DIAGNOSIS — Z510 Encounter for antineoplastic radiation therapy: Secondary | ICD-10-CM | POA: Diagnosis not present

## 2014-09-18 ENCOUNTER — Ambulatory Visit
Admission: RE | Admit: 2014-09-18 | Discharge: 2014-09-18 | Disposition: A | Discharge status: Home / Self Care | Payer: Commercial Managed Care - HMO | Source: Ambulatory Visit | Attending: Radiation Oncology | Admitting: Radiation Oncology

## 2014-09-18 DIAGNOSIS — Z510 Encounter for antineoplastic radiation therapy: Secondary | ICD-10-CM | POA: Diagnosis not present

## 2014-09-21 ENCOUNTER — Ambulatory Visit: Admission: RE | Admit: 2014-09-21 | Payer: Commercial Managed Care - HMO | Source: Ambulatory Visit

## 2014-09-22 ENCOUNTER — Ambulatory Visit
Admission: RE | Admit: 2014-09-22 | Discharge: 2014-09-22 | Disposition: A | Discharge status: Home / Self Care | Payer: Commercial Managed Care - HMO | Source: Ambulatory Visit | Attending: Radiation Oncology | Admitting: Radiation Oncology

## 2014-09-22 DIAGNOSIS — Z510 Encounter for antineoplastic radiation therapy: Secondary | ICD-10-CM | POA: Diagnosis not present

## 2014-09-23 ENCOUNTER — Ambulatory Visit
Admission: RE | Admit: 2014-09-23 | Discharge: 2014-09-23 | Disposition: A | Discharge status: Home / Self Care | Payer: Commercial Managed Care - HMO | Source: Ambulatory Visit | Attending: Radiation Oncology | Admitting: Radiation Oncology

## 2014-09-23 DIAGNOSIS — Z510 Encounter for antineoplastic radiation therapy: Secondary | ICD-10-CM | POA: Diagnosis not present

## 2014-09-24 ENCOUNTER — Ambulatory Visit
Admission: RE | Admit: 2014-09-24 | Discharge: 2014-09-24 | Disposition: A | Discharge status: Home / Self Care | Payer: Commercial Managed Care - HMO | Source: Ambulatory Visit | Attending: Radiation Oncology | Admitting: Radiation Oncology

## 2014-09-24 DIAGNOSIS — Z510 Encounter for antineoplastic radiation therapy: Secondary | ICD-10-CM | POA: Diagnosis not present

## 2014-09-25 ENCOUNTER — Ambulatory Visit
Admission: RE | Admit: 2014-09-25 | Discharge: 2014-09-25 | Disposition: A | Discharge status: Home / Self Care | Payer: Commercial Managed Care - HMO | Source: Ambulatory Visit | Attending: Radiation Oncology | Admitting: Radiation Oncology

## 2014-09-25 DIAGNOSIS — Z510 Encounter for antineoplastic radiation therapy: Secondary | ICD-10-CM | POA: Diagnosis not present

## 2014-09-28 ENCOUNTER — Ambulatory Visit
Admission: RE | Admit: 2014-09-28 | Discharge: 2014-09-28 | Disposition: A | Discharge status: Home / Self Care | Payer: Commercial Managed Care - HMO | Source: Ambulatory Visit | Attending: Radiation Oncology | Admitting: Radiation Oncology

## 2014-09-28 DIAGNOSIS — Z510 Encounter for antineoplastic radiation therapy: Secondary | ICD-10-CM | POA: Diagnosis not present

## 2014-09-29 ENCOUNTER — Ambulatory Visit
Admission: RE | Admit: 2014-09-29 | Discharge: 2014-09-29 | Disposition: A | Discharge status: Home / Self Care | Payer: Commercial Managed Care - HMO | Source: Ambulatory Visit | Attending: Radiation Oncology | Admitting: Radiation Oncology

## 2014-09-29 DIAGNOSIS — Z510 Encounter for antineoplastic radiation therapy: Secondary | ICD-10-CM | POA: Diagnosis not present

## 2014-09-30 ENCOUNTER — Inpatient Hospital Stay: Payer: Commercial Managed Care - HMO | Attending: Radiation Oncology

## 2014-09-30 ENCOUNTER — Ambulatory Visit
Admission: RE | Admit: 2014-09-30 | Discharge: 2014-09-30 | Disposition: A | Discharge status: Home / Self Care | Payer: Commercial Managed Care - HMO | Source: Ambulatory Visit | Attending: Radiation Oncology | Admitting: Radiation Oncology

## 2014-09-30 DIAGNOSIS — C61 Malignant neoplasm of prostate: Secondary | ICD-10-CM

## 2014-09-30 DIAGNOSIS — Z510 Encounter for antineoplastic radiation therapy: Secondary | ICD-10-CM | POA: Diagnosis not present

## 2014-09-30 LAB — CBC
HCT: 47.1 % (ref 40.0–52.0)
HEMOGLOBIN: 15.7 g/dL (ref 13.0–18.0)
MCH: 33 pg (ref 26.0–34.0)
MCHC: 33.3 g/dL (ref 32.0–36.0)
MCV: 99.1 fL (ref 80.0–100.0)
Platelets: 144 10*3/uL — ABNORMAL LOW (ref 150–440)
RBC: 4.75 MIL/uL (ref 4.40–5.90)
RDW: 13.2 % (ref 11.5–14.5)
WBC: 7.3 10*3/uL (ref 3.8–10.6)

## 2014-10-01 ENCOUNTER — Ambulatory Visit
Admission: RE | Admit: 2014-10-01 | Discharge: 2014-10-01 | Disposition: A | Discharge status: Home / Self Care | Payer: Commercial Managed Care - HMO | Source: Ambulatory Visit | Attending: Radiation Oncology | Admitting: Radiation Oncology

## 2014-10-01 DIAGNOSIS — Z510 Encounter for antineoplastic radiation therapy: Secondary | ICD-10-CM | POA: Diagnosis not present

## 2014-10-02 ENCOUNTER — Ambulatory Visit
Admission: RE | Admit: 2014-10-02 | Discharge: 2014-10-02 | Disposition: A | Discharge status: Home / Self Care | Payer: Commercial Managed Care - HMO | Source: Ambulatory Visit | Attending: Radiation Oncology | Admitting: Radiation Oncology

## 2014-10-02 DIAGNOSIS — Z510 Encounter for antineoplastic radiation therapy: Secondary | ICD-10-CM | POA: Diagnosis not present

## 2014-10-06 ENCOUNTER — Ambulatory Visit
Admission: RE | Admit: 2014-10-06 | Discharge: 2014-10-06 | Disposition: A | Discharge status: Home / Self Care | Payer: Commercial Managed Care - HMO | Source: Ambulatory Visit | Attending: Radiation Oncology | Admitting: Radiation Oncology

## 2014-10-06 DIAGNOSIS — Z510 Encounter for antineoplastic radiation therapy: Secondary | ICD-10-CM | POA: Diagnosis not present

## 2014-10-07 ENCOUNTER — Ambulatory Visit
Admission: RE | Admit: 2014-10-07 | Discharge: 2014-10-07 | Disposition: A | Discharge status: Home / Self Care | Payer: Commercial Managed Care - HMO | Source: Ambulatory Visit | Attending: Radiation Oncology | Admitting: Radiation Oncology

## 2014-10-07 DIAGNOSIS — Z510 Encounter for antineoplastic radiation therapy: Secondary | ICD-10-CM | POA: Diagnosis not present

## 2014-10-08 ENCOUNTER — Ambulatory Visit
Admission: RE | Admit: 2014-10-08 | Discharge: 2014-10-08 | Disposition: A | Discharge status: Home / Self Care | Payer: Commercial Managed Care - HMO | Source: Ambulatory Visit | Attending: Radiation Oncology | Admitting: Radiation Oncology

## 2014-10-08 DIAGNOSIS — Z510 Encounter for antineoplastic radiation therapy: Secondary | ICD-10-CM | POA: Diagnosis not present

## 2014-10-09 ENCOUNTER — Ambulatory Visit
Admission: RE | Admit: 2014-10-09 | Discharge: 2014-10-09 | Disposition: A | Discharge status: Home / Self Care | Payer: Commercial Managed Care - HMO | Source: Ambulatory Visit | Attending: Radiation Oncology | Admitting: Radiation Oncology

## 2014-10-09 DIAGNOSIS — Z510 Encounter for antineoplastic radiation therapy: Secondary | ICD-10-CM | POA: Diagnosis not present

## 2014-10-12 ENCOUNTER — Ambulatory Visit
Admission: RE | Admit: 2014-10-12 | Discharge: 2014-10-12 | Disposition: A | Discharge status: Home / Self Care | Payer: Commercial Managed Care - HMO | Source: Ambulatory Visit | Attending: Radiation Oncology | Admitting: Radiation Oncology

## 2014-10-12 DIAGNOSIS — Z510 Encounter for antineoplastic radiation therapy: Secondary | ICD-10-CM | POA: Diagnosis not present

## 2014-10-13 ENCOUNTER — Ambulatory Visit
Admission: RE | Admit: 2014-10-13 | Discharge: 2014-10-13 | Disposition: A | Discharge status: Home / Self Care | Payer: Commercial Managed Care - HMO | Source: Ambulatory Visit | Attending: Radiation Oncology | Admitting: Radiation Oncology

## 2014-10-13 DIAGNOSIS — Z510 Encounter for antineoplastic radiation therapy: Secondary | ICD-10-CM | POA: Diagnosis not present

## 2014-10-14 ENCOUNTER — Inpatient Hospital Stay: Payer: Commercial Managed Care - HMO | Attending: Radiation Oncology

## 2014-10-14 ENCOUNTER — Ambulatory Visit
Admission: RE | Admit: 2014-10-14 | Discharge: 2014-10-14 | Disposition: A | Discharge status: Home / Self Care | Payer: Commercial Managed Care - HMO | Source: Ambulatory Visit | Attending: Radiation Oncology | Admitting: Radiation Oncology

## 2014-10-14 DIAGNOSIS — Z510 Encounter for antineoplastic radiation therapy: Secondary | ICD-10-CM | POA: Diagnosis not present

## 2014-10-15 ENCOUNTER — Ambulatory Visit
Admission: RE | Admit: 2014-10-15 | Discharge: 2014-10-15 | Disposition: A | Discharge status: Home / Self Care | Payer: Commercial Managed Care - HMO | Source: Ambulatory Visit | Attending: Radiation Oncology | Admitting: Radiation Oncology

## 2014-10-15 DIAGNOSIS — Z510 Encounter for antineoplastic radiation therapy: Secondary | ICD-10-CM | POA: Diagnosis not present

## 2014-10-16 ENCOUNTER — Ambulatory Visit
Admission: RE | Admit: 2014-10-16 | Discharge: 2014-10-16 | Disposition: A | Discharge status: Home / Self Care | Payer: Commercial Managed Care - HMO | Source: Ambulatory Visit | Attending: Radiation Oncology | Admitting: Radiation Oncology

## 2014-10-16 DIAGNOSIS — Z510 Encounter for antineoplastic radiation therapy: Secondary | ICD-10-CM | POA: Diagnosis not present

## 2014-10-19 ENCOUNTER — Ambulatory Visit
Admission: RE | Admit: 2014-10-19 | Discharge: 2014-10-19 | Disposition: A | Discharge status: Home / Self Care | Payer: Commercial Managed Care - HMO | Source: Ambulatory Visit | Attending: Radiation Oncology | Admitting: Radiation Oncology

## 2014-10-19 DIAGNOSIS — Z510 Encounter for antineoplastic radiation therapy: Secondary | ICD-10-CM | POA: Diagnosis not present

## 2014-10-20 ENCOUNTER — Ambulatory Visit
Admission: RE | Admit: 2014-10-20 | Discharge: 2014-10-20 | Disposition: A | Discharge status: Home / Self Care | Payer: Commercial Managed Care - HMO | Source: Ambulatory Visit | Attending: Radiation Oncology | Admitting: Radiation Oncology

## 2014-10-20 DIAGNOSIS — Z510 Encounter for antineoplastic radiation therapy: Secondary | ICD-10-CM | POA: Diagnosis not present

## 2014-10-21 ENCOUNTER — Ambulatory Visit: Admission: RE | Admit: 2014-10-21 | Payer: Commercial Managed Care - HMO | Source: Ambulatory Visit

## 2014-10-22 ENCOUNTER — Ambulatory Visit
Admission: RE | Admit: 2014-10-22 | Discharge: 2014-10-22 | Disposition: A | Discharge status: Home / Self Care | Payer: Commercial Managed Care - HMO | Source: Ambulatory Visit | Attending: Radiation Oncology | Admitting: Radiation Oncology

## 2014-10-22 DIAGNOSIS — Z510 Encounter for antineoplastic radiation therapy: Secondary | ICD-10-CM | POA: Diagnosis not present

## 2014-10-23 ENCOUNTER — Ambulatory Visit
Admission: RE | Admit: 2014-10-23 | Discharge: 2014-10-23 | Disposition: A | Discharge status: Home / Self Care | Payer: Commercial Managed Care - HMO | Source: Ambulatory Visit | Attending: Radiation Oncology | Admitting: Radiation Oncology

## 2014-10-23 DIAGNOSIS — Z510 Encounter for antineoplastic radiation therapy: Secondary | ICD-10-CM | POA: Diagnosis not present

## 2014-10-26 ENCOUNTER — Ambulatory Visit
Admission: RE | Admit: 2014-10-26 | Discharge: 2014-10-26 | Disposition: A | Discharge status: Home / Self Care | Payer: Commercial Managed Care - HMO | Source: Ambulatory Visit | Attending: Radiation Oncology | Admitting: Radiation Oncology

## 2014-10-26 DIAGNOSIS — Z510 Encounter for antineoplastic radiation therapy: Secondary | ICD-10-CM | POA: Diagnosis not present

## 2014-10-27 ENCOUNTER — Ambulatory Visit
Admission: RE | Admit: 2014-10-27 | Discharge: 2014-10-27 | Disposition: A | Discharge status: Home / Self Care | Payer: Commercial Managed Care - HMO | Source: Ambulatory Visit | Attending: Radiation Oncology | Admitting: Radiation Oncology

## 2014-10-27 DIAGNOSIS — Z510 Encounter for antineoplastic radiation therapy: Secondary | ICD-10-CM | POA: Diagnosis not present

## 2014-10-28 ENCOUNTER — Inpatient Hospital Stay: Payer: Commercial Managed Care - HMO

## 2014-10-28 ENCOUNTER — Ambulatory Visit
Admission: RE | Admit: 2014-10-28 | Discharge: 2014-10-28 | Disposition: A | Discharge status: Home / Self Care | Payer: Commercial Managed Care - HMO | Source: Ambulatory Visit | Attending: Radiation Oncology | Admitting: Radiation Oncology

## 2014-10-28 DIAGNOSIS — Z510 Encounter for antineoplastic radiation therapy: Secondary | ICD-10-CM | POA: Diagnosis not present

## 2014-10-29 ENCOUNTER — Ambulatory Visit
Admission: RE | Admit: 2014-10-29 | Discharge: 2014-10-29 | Disposition: A | Discharge status: Home / Self Care | Payer: Commercial Managed Care - HMO | Source: Ambulatory Visit | Attending: Radiation Oncology | Admitting: Radiation Oncology

## 2014-10-29 DIAGNOSIS — Z510 Encounter for antineoplastic radiation therapy: Secondary | ICD-10-CM | POA: Diagnosis not present

## 2014-10-30 ENCOUNTER — Ambulatory Visit
Admission: RE | Admit: 2014-10-30 | Discharge: 2014-10-30 | Disposition: A | Discharge status: Home / Self Care | Payer: Commercial Managed Care - HMO | Source: Ambulatory Visit | Attending: Radiation Oncology | Admitting: Radiation Oncology

## 2014-10-30 DIAGNOSIS — Z510 Encounter for antineoplastic radiation therapy: Secondary | ICD-10-CM | POA: Diagnosis not present

## 2014-11-02 ENCOUNTER — Ambulatory Visit
Admission: RE | Admit: 2014-11-02 | Discharge: 2014-11-02 | Disposition: A | Discharge status: Home / Self Care | Payer: Commercial Managed Care - HMO | Source: Ambulatory Visit | Attending: Radiation Oncology | Admitting: Radiation Oncology

## 2014-11-02 DIAGNOSIS — Z510 Encounter for antineoplastic radiation therapy: Secondary | ICD-10-CM | POA: Diagnosis not present

## 2014-11-03 ENCOUNTER — Ambulatory Visit
Admission: RE | Admit: 2014-11-03 | Discharge: 2014-11-03 | Disposition: A | Discharge status: Home / Self Care | Payer: Commercial Managed Care - HMO | Source: Ambulatory Visit | Attending: Radiation Oncology | Admitting: Radiation Oncology

## 2014-11-03 DIAGNOSIS — Z510 Encounter for antineoplastic radiation therapy: Secondary | ICD-10-CM | POA: Diagnosis not present

## 2014-11-04 ENCOUNTER — Ambulatory Visit
Admission: RE | Admit: 2014-11-04 | Discharge: 2014-11-04 | Disposition: A | Discharge status: Home / Self Care | Payer: Commercial Managed Care - HMO | Source: Ambulatory Visit | Attending: Radiation Oncology | Admitting: Radiation Oncology

## 2014-11-04 DIAGNOSIS — Z510 Encounter for antineoplastic radiation therapy: Secondary | ICD-10-CM | POA: Diagnosis not present

## 2014-11-05 ENCOUNTER — Ambulatory Visit
Admission: RE | Admit: 2014-11-05 | Discharge: 2014-11-05 | Disposition: A | Discharge status: Home / Self Care | Payer: Commercial Managed Care - HMO | Source: Ambulatory Visit | Attending: Radiation Oncology | Admitting: Radiation Oncology

## 2014-11-05 DIAGNOSIS — Z510 Encounter for antineoplastic radiation therapy: Secondary | ICD-10-CM | POA: Diagnosis not present

## 2014-11-06 ENCOUNTER — Ambulatory Visit
Admission: RE | Admit: 2014-11-06 | Discharge: 2014-11-06 | Disposition: A | Discharge status: Home / Self Care | Payer: Commercial Managed Care - HMO | Source: Ambulatory Visit | Attending: Radiation Oncology | Admitting: Radiation Oncology

## 2014-11-06 DIAGNOSIS — Z510 Encounter for antineoplastic radiation therapy: Secondary | ICD-10-CM | POA: Diagnosis not present

## 2014-11-09 ENCOUNTER — Ambulatory Visit
Admission: RE | Admit: 2014-11-09 | Discharge: 2014-11-09 | Disposition: A | Discharge status: Home / Self Care | Payer: Commercial Managed Care - HMO | Source: Ambulatory Visit | Attending: Radiation Oncology | Admitting: Radiation Oncology

## 2014-11-09 DIAGNOSIS — Z510 Encounter for antineoplastic radiation therapy: Secondary | ICD-10-CM | POA: Diagnosis not present

## 2014-11-10 ENCOUNTER — Ambulatory Visit
Admission: RE | Admit: 2014-11-10 | Discharge: 2014-11-10 | Disposition: A | Discharge status: Home / Self Care | Payer: Commercial Managed Care - HMO | Source: Ambulatory Visit | Attending: Radiation Oncology | Admitting: Radiation Oncology

## 2014-11-10 DIAGNOSIS — Z510 Encounter for antineoplastic radiation therapy: Secondary | ICD-10-CM | POA: Diagnosis not present

## 2014-11-11 ENCOUNTER — Ambulatory Visit
Admission: RE | Admit: 2014-11-11 | Discharge: 2014-11-11 | Disposition: A | Discharge status: Home / Self Care | Payer: Commercial Managed Care - HMO | Source: Ambulatory Visit | Attending: Radiation Oncology | Admitting: Radiation Oncology

## 2014-11-11 DIAGNOSIS — Z510 Encounter for antineoplastic radiation therapy: Secondary | ICD-10-CM | POA: Diagnosis not present

## 2014-11-12 ENCOUNTER — Ambulatory Visit: Payer: Commercial Managed Care - HMO

## 2014-11-12 ENCOUNTER — Ambulatory Visit
Admission: RE | Admit: 2014-11-12 | Discharge: 2014-11-12 | Disposition: A | Discharge status: Home / Self Care | Payer: Commercial Managed Care - HMO | Source: Ambulatory Visit | Attending: Radiation Oncology | Admitting: Radiation Oncology

## 2014-11-12 DIAGNOSIS — Z510 Encounter for antineoplastic radiation therapy: Secondary | ICD-10-CM | POA: Diagnosis not present

## 2014-11-13 ENCOUNTER — Ambulatory Visit
Admission: RE | Admit: 2014-11-13 | Discharge: 2014-11-13 | Disposition: A | Discharge status: Home / Self Care | Payer: Commercial Managed Care - HMO | Source: Ambulatory Visit | Attending: Radiation Oncology | Admitting: Radiation Oncology

## 2014-11-13 ENCOUNTER — Ambulatory Visit: Payer: Commercial Managed Care - HMO

## 2014-11-13 DIAGNOSIS — Z510 Encounter for antineoplastic radiation therapy: Secondary | ICD-10-CM | POA: Diagnosis not present

## 2014-11-16 ENCOUNTER — Ambulatory Visit
Admission: RE | Admit: 2014-11-16 | Discharge: 2014-11-16 | Disposition: A | Discharge status: Home / Self Care | Payer: Commercial Managed Care - HMO | Source: Ambulatory Visit | Attending: Radiation Oncology | Admitting: Radiation Oncology

## 2014-11-16 DIAGNOSIS — Z510 Encounter for antineoplastic radiation therapy: Secondary | ICD-10-CM | POA: Diagnosis not present

## 2014-12-13 ENCOUNTER — Other Ambulatory Visit: Payer: Self-pay | Admitting: Internal Medicine

## 2014-12-15 ENCOUNTER — Other Ambulatory Visit: Payer: Self-pay | Admitting: *Deleted

## 2014-12-15 ENCOUNTER — Ambulatory Visit: Payer: Commercial Managed Care - HMO | Admitting: Internal Medicine

## 2014-12-21 ENCOUNTER — Ambulatory Visit
Admission: RE | Admit: 2014-12-21 | Discharge: 2014-12-21 | Disposition: A | Discharge status: Home / Self Care | Payer: Commercial Managed Care - HMO | Source: Ambulatory Visit | Attending: Radiation Oncology | Admitting: Radiation Oncology

## 2014-12-21 ENCOUNTER — Encounter: Payer: Self-pay | Admitting: Radiation Oncology

## 2014-12-21 VITALS — BP 139/72 | HR 76 | Temp 97.7°F | Resp 18 | Wt 214.1 lb

## 2014-12-21 DIAGNOSIS — C61 Malignant neoplasm of prostate: Secondary | ICD-10-CM

## 2014-12-21 NOTE — Progress Notes (Signed)
Radiation Oncology Follow up Note  Name: Ian Ross   Date:   12/21/2014 MRN:  [id number] DOB: 1945-09-10    This 69 y.o. male presents to the clinic today for follow-up for prostate cancer.  REFERRING PROVIDER: Jackolyn Confer, MD  HPI: Patient is a 69 year old male now seen out 1 month having completed IM RT radiation therapy for clinical stage IIa (T1 CN 0 M0) adenocarcinoma the prostate Gleason score of 7 (4+3) presenting the PSA of 5.. He underwent I MRT radiation therapy to 8000 cGy now seen out 1 month and is doing well. He specifically denies diarrhea dysuria or any other GI/GU complaints.  COMPLICATIONS OF TREATMENT: none  FOLLOW UP COMPLIANCE: keeps appointments   PHYSICAL EXAM:  BP 139/72 mmHg  Pulse 76  Temp(Src) 97.7 F (36.5 C)  Resp 18  Wt 214 lb 1.1 oz (97.1 kg) On rectal exam rectal sphincter tone is good. Prostate is smooth contracted without evidence of nodularity or mass. Sulcus is preserved bilaterally. No discrete nodularity is identified. No other rectal abnormalities are noted. Well-developed well-nourished patient in NAD. HEENT reveals PERLA, EOMI, discs not visualized.  Oral cavity is clear. No oral mucosal lesions are identified. Neck is clear without evidence of cervical or supraclavicular adenopathy. Lungs are clear to A&P. Cardiac examination is essentially unremarkable with regular rate and rhythm without murmur rub or thrill. Abdomen is benign with no organomegaly or masses noted. Motor sensory and DTR levels are equal and symmetric in the upper and lower extremities. Cranial nerves II through XII are grossly intact. Proprioception is intact. No peripheral adenopathy or edema is identified. No motor or sensory levels are noted. Crude visual fields are within normal range.   RADIOLOGY RESULTS: No current films for review  PLAN: At the present time he is doing well 1 month out with no smoking side effects. I am please was overall progress. I've  asked to see him back in 3-4 months for follow-up and will obtain a PSA at that time it has not already been performed. Patient is to call sooner with any concerns.  I would like to take this opportunity for allowing me to participate in the care of your patient.Armstead Peaks., MD

## 2014-12-24 ENCOUNTER — Ambulatory Visit (INDEPENDENT_AMBULATORY_CARE_PROVIDER_SITE_OTHER): Payer: Commercial Managed Care - HMO | Admitting: Internal Medicine

## 2014-12-24 ENCOUNTER — Encounter: Payer: Self-pay | Admitting: Internal Medicine

## 2014-12-24 VITALS — BP 147/69 | HR 58 | Temp 97.6°F | Ht 74.0 in | Wt 217.0 lb

## 2014-12-24 DIAGNOSIS — H66003 Acute suppurative otitis media without spontaneous rupture of ear drum, bilateral: Secondary | ICD-10-CM

## 2014-12-24 DIAGNOSIS — G4700 Insomnia, unspecified: Secondary | ICD-10-CM

## 2014-12-24 DIAGNOSIS — H66009 Acute suppurative otitis media without spontaneous rupture of ear drum, unspecified ear: Secondary | ICD-10-CM | POA: Insufficient documentation

## 2014-12-24 DIAGNOSIS — C61 Malignant neoplasm of prostate: Secondary | ICD-10-CM

## 2014-12-24 MED ORDER — ZALEPLON 10 MG PO CAPS: 10.0000 mg | ORAL_CAPSULE | Freq: Every evening | ORAL | Status: DC | PRN

## 2014-12-24 MED ORDER — AMOXICILLIN-POT CLAVULANATE 875-125 MG PO TABS: 1.0000 | ORAL_TABLET | Freq: Two times a day (BID) | ORAL | Status: DC

## 2014-12-24 MED ORDER — ZALEPLON 5 MG PO CAPS: 5.0000 mg | ORAL_CAPSULE | Freq: Every evening | ORAL | Status: DC | PRN

## 2014-12-24 NOTE — Assessment & Plan Note (Signed)
Reviewed notes from [place]. Will follow.

## 2014-12-24 NOTE — Assessment & Plan Note (Signed)
Recent insomnia. Will add Sonata. Discussed potential risks of this medication. Follow up in 3 weeks and prn.

## 2014-12-24 NOTE — Patient Instructions (Signed)
Start Augmentin '875mg'$  twice daily. Take a probiotic or eat yogurt while on this medication.  Start Sonata '5mg'$  at bedtime to help with sleep.  Follow up in 3 weeks or sooner if symptoms are not improving.

## 2014-12-24 NOTE — Progress Notes (Signed)
Subjective:    Patient ID: Ian Ross, male    DOB: November 21, 1945, 69 y.o.   MRN: [id number]  HPI  69YO male presents for acute visit.  Cough, nasal congestion, ear pain, eye drainage since last Sunday. No fever. Cough is non-productive.  Mild dyspnea. Taking AlkaSeltzer with no improvement.  Also concerned about difficulty falling asleep last few weeks. Unable to fall asleep. Not taking anything for this. Pracitces good sleep hygiene.  Completed radiation for prostate cancer. Tolerated well. Had follow up with rad onc this week.  Wt Readings from Last 3 Encounters:  12/24/14 217 lb (98.431 kg)  12/21/14 214 lb 1.1 oz (97.1 kg)  08/19/14 213 lb (96.616 kg)   BP Readings from Last 3 Encounters:  12/24/14 147/69  12/21/14 139/72  08/13/14 121/62      Past Medical History  Diagnosis Date  . Chicken pox   . Depression   . Dementia   . Testicular cyst     Dr. Jacqlyn Larsen  . Arthritis   . Gout   . Prostate cancer    Family History  Problem Relation Age of Onset  . Cancer Mother     breast  . Heart disease Mother   . Cancer Father     prostate  . Heart disease Father    Past Surgical History  Procedure Laterality Date  . Knee arthroscopy      Dr. Tamala Julian   Social History   Social History  . Marital Status: Married    Spouse Name: N/A  . Number of Children: N/A  . Years of Education: N/A   Social History Main Topics  . Smoking status: Current Some Day Smoker  . Smokeless tobacco: Current User     Comment: only occasionally, chews tobacco  . Alcohol Use: No  . Drug Use: No  . Sexual Activity: Not Asked   Other Topics Concern  . None   Social History Narrative   Lives in [place], married, has 2 children (boys), 4 grandchildren.      Work - retired Architect    Review of Systems  Constitutional: Negative for fever, chills, activity change and fatigue.  HENT: Positive for congestion, ear pain and rhinorrhea. Negative for ear discharge, hearing  loss, nosebleeds, postnasal drip, sinus pressure, sneezing, sore throat, tinnitus, trouble swallowing and voice change.   Eyes: Negative for discharge, redness, itching and visual disturbance.  Respiratory: Positive for cough. Negative for chest tightness, shortness of breath, wheezing and stridor.   Cardiovascular: Negative for chest pain and leg swelling.  Musculoskeletal: Negative for myalgias, arthralgias, neck pain and neck stiffness.  Skin: Negative for color change and rash.  Neurological: Negative for dizziness, facial asymmetry and headaches.  Psychiatric/Behavioral: Negative for sleep disturbance.       Objective:    BP 147/69 mmHg  Pulse 58  Temp(Src) 97.6 F (36.4 C) (Oral)  Ht '6\' 2"'$  (1.88 m)  Wt 217 lb (98.431 kg)  BMI 27.85 kg/m2  SpO2 98% Physical Exam  Constitutional: He is oriented to person, place, and time. He appears well-developed and well-nourished. No distress.  HENT:  Head: Normocephalic and atraumatic.  Right Ear: External ear normal. Tympanic membrane is erythematous and bulging. A middle ear effusion is present.  Left Ear: External ear normal. Tympanic membrane is bulging. A middle ear effusion is present.  Nose: Nose normal.  Mouth/Throat: Oropharynx is clear and moist. No oropharyngeal exudate.  Eyes: Conjunctivae and EOM are normal. Pupils are equal, round, and reactive  to light. Right eye exhibits no discharge. Left eye exhibits no discharge. No scleral icterus.  Neck: Normal range of motion. Neck supple. No tracheal deviation present. No thyromegaly present.  Cardiovascular: Normal rate, regular rhythm and normal heart sounds.  Exam reveals no gallop and no friction rub.   No murmur heard. Pulmonary/Chest: Effort normal and breath sounds normal. No accessory muscle usage. No tachypnea. No respiratory distress. He has no decreased breath sounds. He has no wheezes. He has no rhonchi. He has no rales. He exhibits no tenderness.  Musculoskeletal: Normal  range of motion. He exhibits no edema.  Lymphadenopathy:    He has no cervical adenopathy.  Neurological: He is alert and oriented to person, place, and time. No cranial nerve deficit. Coordination normal.  Skin: Skin is warm and dry. No rash noted. He is not diaphoretic. No erythema. No pallor.  Psychiatric: He has a normal mood and affect. His behavior is normal. Judgment and thought content normal.          Assessment & Plan:   Problem List Items Addressed This Visit      Unprioritized   Acute purulent otitis media - Primary    Bilateral OM on exam. Will start Augmentin bid. Recheck ears in 3 weeks or sooner if symptoms not improving.      Relevant Medications   amoxicillin-clavulanate (AUGMENTIN) 875-125 MG per tablet   Insomnia    Recent insomnia. Will add Sonata. Discussed potential risks of this medication. Follow up in 3 weeks and prn.      Malignant neoplasm of prostate    Reviewed notes from [place]. Will follow.          Return in about 3 weeks (around 01/14/2015) for Recheck.

## 2014-12-24 NOTE — Assessment & Plan Note (Signed)
Bilateral OM on exam. Will start Augmentin bid. Recheck ears in 3 weeks or sooner if symptoms not improving.

## 2014-12-24 NOTE — Progress Notes (Signed)
Pre visit review using our clinic review tool, if applicable. No additional management support is needed unless otherwise documented below in the visit note. 

## 2014-12-25 ENCOUNTER — Ambulatory Visit: Payer: Commercial Managed Care - HMO | Admitting: Internal Medicine

## 2015-01-01 ENCOUNTER — Other Ambulatory Visit: Payer: Self-pay | Admitting: Urology

## 2015-01-14 ENCOUNTER — Encounter: Payer: Self-pay | Admitting: Internal Medicine

## 2015-01-14 ENCOUNTER — Ambulatory Visit (INDEPENDENT_AMBULATORY_CARE_PROVIDER_SITE_OTHER): Payer: Commercial Managed Care - HMO | Admitting: Internal Medicine

## 2015-01-14 VITALS — BP 145/69 | HR 77 | Temp 97.5°F | Ht 74.0 in | Wt 216.0 lb

## 2015-01-14 DIAGNOSIS — C61 Malignant neoplasm of prostate: Secondary | ICD-10-CM | POA: Diagnosis not present

## 2015-01-14 DIAGNOSIS — Z23 Encounter for immunization: Secondary | ICD-10-CM | POA: Diagnosis not present

## 2015-01-14 DIAGNOSIS — G629 Polyneuropathy, unspecified: Secondary | ICD-10-CM | POA: Diagnosis not present

## 2015-01-14 DIAGNOSIS — F0390 Unspecified dementia without behavioral disturbance: Secondary | ICD-10-CM

## 2015-01-14 DIAGNOSIS — H66004 Acute suppurative otitis media without spontaneous rupture of ear drum, recurrent, right ear: Secondary | ICD-10-CM | POA: Diagnosis not present

## 2015-01-14 MED ORDER — LEVOFLOXACIN 500 MG PO TABS: 500.0000 mg | ORAL_TABLET | Freq: Every day | ORAL | Status: DC

## 2015-01-14 MED ORDER — GABAPENTIN 100 MG PO CAPS: 100.0000 mg | ORAL_CAPSULE | Freq: Every day | ORAL | Status: DC

## 2015-01-14 NOTE — Progress Notes (Signed)
Subjective:    Patient ID: Ian Ross, male    DOB: 24-Jun-1945, 69 y.o.   MRN: [id number]  HPI  69YO male presents for follow up.  Recently seen in 12/2014 and diagnosed and treated for bilateral OM. Continues to have ear pain, congestion. No fever, chills. Pain is worse over right ear versus left ear. Minimal improvement with taking Augmentin. No drainage from ear or hearing loss.  Prostate cancer - Completed radiation therapy with Dr. Baruch Gouty. Tolerated well. Has follow up scheduled next year.  Neuropathy - Some burning pain noted over feet bilaterally during the night. Improved with rubbing legs together, but is disruptive to sleep.    Wt Readings from Last 3 Encounters:  01/14/15 216 lb (97.977 kg)  12/24/14 217 lb (98.431 kg)  12/21/14 214 lb 1.1 oz (97.1 kg)   BP Readings from Last 3 Encounters:  01/14/15 145/69  12/24/14 147/69  12/21/14 139/72    Past Medical History  Diagnosis Date  . Chicken pox   . Depression   . Dementia   . Testicular cyst     Dr. Jacqlyn Larsen  . Arthritis   . Gout   . Prostate cancer [facility])    Family History  Problem Relation Age of Onset  . Cancer Mother     breast  . Heart disease Mother   . Cancer Father     prostate  . Heart disease Father    Past Surgical History  Procedure Laterality Date  . Knee arthroscopy      Dr. Tamala Julian   Social History   Social History  . Marital Status: Married    Spouse Name: N/A  . Number of Children: N/A  . Years of Education: N/A   Social History Main Topics  . Smoking status: Current Some Day Smoker  . Smokeless tobacco: Current User     Comment: only occasionally, chews tobacco  . Alcohol Use: No  . Drug Use: No  . Sexual Activity: Not Asked   Other Topics Concern  . None   Social History Narrative   Lives in [place], married, has 2 children (boys), 4 grandchildren.      Work - retired Architect    Review of Systems  Constitutional: Negative for fever, chills, activity  change, appetite change, fatigue and unexpected weight change.  Eyes: Negative for visual disturbance.  Respiratory: Negative for cough and shortness of breath.   Cardiovascular: Negative for chest pain, palpitations and leg swelling.  Gastrointestinal: Negative for nausea, vomiting, abdominal pain, diarrhea, constipation and abdominal distention.  Genitourinary: Negative for dysuria, urgency and difficulty urinating.  Musculoskeletal: Negative for arthralgias and gait problem.  Skin: Negative for color change and rash.  Hematological: Negative for adenopathy.  Psychiatric/Behavioral: Negative for sleep disturbance and dysphoric mood. The patient is not nervous/anxious.        Objective:    BP 145/69 mmHg  Pulse 77  Temp(Src) 97.5 F (36.4 C) (Oral)  Ht '6\' 2"'$  (1.88 m)  Wt 216 lb (97.977 kg)  BMI 27.72 kg/m2  SpO2 97% Physical Exam  Constitutional: He is oriented to person, place, and time. He appears well-developed and well-nourished. No distress.  HENT:  Head: Normocephalic and atraumatic.  Right Ear: External ear normal.  Left Ear: External ear normal.  Nose: Nose normal.  Mouth/Throat: Oropharynx is clear and moist. No oropharyngeal exudate.  Eyes: Conjunctivae and EOM are normal. Pupils are equal, round, and reactive to light. Right eye exhibits no discharge. Left eye exhibits no  discharge. No scleral icterus.  Neck: Normal range of motion. Neck supple. No tracheal deviation present. No thyromegaly present.  Cardiovascular: Normal rate, regular rhythm and normal heart sounds.  Exam reveals no gallop and no friction rub.   No murmur heard. Pulmonary/Chest: Effort normal and breath sounds normal. No accessory muscle usage. No tachypnea. No respiratory distress. He has no decreased breath sounds. He has no wheezes. He has no rhonchi. He has no rales. He exhibits no tenderness.  Musculoskeletal: Normal range of motion. He exhibits no edema.  Lymphadenopathy:    He has no  cervical adenopathy.  Neurological: He is alert and oriented to person, place, and time. No cranial nerve deficit. Coordination normal.  Skin: Skin is warm and dry. No rash noted. He is not diaphoretic. No erythema. No pallor.  Psychiatric: He has a normal mood and affect. His behavior is normal. Judgment and thought content normal.          Assessment & Plan:   Problem List Items Addressed This Visit      Unprioritized   Dementia (Chronic)    Symptoms stable on Aricept. Will continue       Relevant Medications   gabapentin (NEURONTIN) 100 MG capsule   Malignant neoplasm of prostate (HCC) (Chronic)    Reviewed notes from Dr. Baruch Gouty. Follow up as schedule.d      Relevant Medications   gabapentin (NEURONTIN) 100 MG capsule   Neuropathy (HCC) - Primary (Chronic)    Neuropathy most c/w B12 deficiency, now supplemented. Will add Neurontin at bedtime to help with burning pain. Discussed potential risks of this medication. Follow up 4 weeks and prn.      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 Will start Levaquin. Discussed referral to ENT, but he prefers to hold off for now. Follow up 4 weeks.      Relevant Medications   levofloxacin (LEVAQUIN) 500 MG tablet       Return in about 4 weeks (around 02/11/2015) for Wellness Visit.

## 2015-01-14 NOTE — Assessment & Plan Note (Signed)
Neuropathy most c/w B12 deficiency, now supplemented. Will add Neurontin at bedtime to help with burning pain. Discussed potential risks of this medication. Follow up 4 weeks and prn.

## 2015-01-14 NOTE — Patient Instructions (Signed)
Start Neurontin '100mg'$  at bedtime to help with leg pain.  Start Levaquin '500mg'$  daily for 7 days for ear infection. Please call if symptoms are not improving.

## 2015-01-14 NOTE — Assessment & Plan Note (Signed)
Recurrent right OM. Will start Levaquin. Discussed referral to ENT, but he prefers to hold off for now. Follow up 4 weeks.

## 2015-01-14 NOTE — Progress Notes (Signed)
Pre visit review using our clinic review tool, if applicable. No additional management support is needed unless otherwise documented below in the visit note. 

## 2015-01-14 NOTE — Assessment & Plan Note (Signed)
Symptoms stable on Aricept. Will continue. 

## 2015-01-14 NOTE — Assessment & Plan Note (Signed)
Reviewed notes from Dr. Baruch Gouty. Follow up as schedule.d

## 2015-01-15 ENCOUNTER — Other Ambulatory Visit: Payer: Self-pay | Admitting: Internal Medicine

## 2015-02-15 ENCOUNTER — Ambulatory Visit (INDEPENDENT_AMBULATORY_CARE_PROVIDER_SITE_OTHER): Payer: Commercial Managed Care - HMO

## 2015-02-15 ENCOUNTER — Other Ambulatory Visit: Payer: Self-pay | Admitting: Internal Medicine

## 2015-02-15 VITALS — BP 140/72 | HR 68 | Temp 98.0°F | Resp 12 | Ht 74.0 in | Wt 215.2 lb

## 2015-02-15 DIAGNOSIS — Z0000 Encounter for general adult medical examination without abnormal findings: Secondary | ICD-10-CM | POA: Diagnosis not present

## 2015-02-15 DIAGNOSIS — Z1159 Encounter for screening for other viral diseases: Secondary | ICD-10-CM

## 2015-02-15 DIAGNOSIS — H9203 Otalgia, bilateral: Secondary | ICD-10-CM

## 2015-02-15 NOTE — Progress Notes (Signed)
Annual Wellness Visit as completed by Health Coach was reviewed in full.  

## 2015-02-15 NOTE — Progress Notes (Signed)
Subjective:   Ian Ross is a 69 y.o. male who presents for Medicare Annual/Subsequent preventive examination.  Review of Systems:  No ROS.  Medicare Wellness Visit.  Cardiac Risk Factors include: advanced age (>53mn, >>44women);male gender     Objective:    Vitals: BP 140/72 mmHg  Pulse 68  Temp(Src) 98 F (36.7 C) (Oral)  Resp 12  Ht '6\' 2"'$  (1.88 m)  Wt 215 lb 3.2 oz (97.614 kg)  BMI 27.62 kg/m2  SpO2 98%  Tobacco History  Smoking status  . Current Some Day Smoker  Smokeless tobacco  . Current User    Comment: only occasionally, chews tobacco     Ready to quit: No Counseling given: Yes Smoking cessation education provided  Past Medical History  Diagnosis Date  . Chicken pox   . Depression   . Dementia   . Testicular cyst     Dr. CJacqlyn Larsen . Arthritis   . Gout   . Prostate cancer ([facility]    Past Surgical History  Procedure Laterality Date  . Knee arthroscopy      Dr. STamala Julian  Family History  Problem Relation Age of Onset  . Cancer Mother     breast  . Heart disease Mother   . Cancer Father     prostate  . Heart disease Father    History  Sexual Activity  . Sexual Activity: No    Outpatient Encounter Prescriptions as of 02/15/2015  Medication Sig  . aspirin 81 MG tablet Take 81 mg by mouth daily.  . colchicine 0.6 MG tablet Take 1 tablet by mouth daily as needed.   . cyanocobalamin (,VITAMIN B-12,) 1000 MCG/ML injection Inject 1 mL (1,000 mcg total) into the muscle every 30 (thirty) days.  .Marland Kitchendonepezil (ARICEPT) 5 MG tablet TAKE 1 TABLET BY MOUTH ONCE A DAY  . gabapentin (NEURONTIN) 100 MG capsule Take 1 capsule (100 mg total) by mouth at bedtime.  . sertraline (ZOLOFT) 100 MG tablet TAKE 1 TABLET BY MOUTH EVERY DAY  . zaleplon (SONATA) 5 MG capsule Take 1 capsule (5 mg total) by mouth at bedtime as needed for sleep.  . [DISCONTINUED] levofloxacin (LEVAQUIN) 500 MG tablet Take 1 tablet (500 mg total) by mouth daily.   No  facility-administered encounter medications on file as of 02/15/2015.    Activities of Daily Living In your present state of health, do you have any difficulty performing the following activities: 02/15/2015  Hearing? N  Vision? N  Difficulty concentrating or making decisions? N  Walking or climbing stairs? N  Dressing or bathing? N  Doing errands, shopping? N  Preparing Food and eating ? N  Using the Toilet? N  In the past six months, have you accidently leaked urine? N  Do you have problems with loss of bowel control? N  Managing your Medications? N  Managing your Finances? N  Housekeeping or managing your Housekeeping? N    Patient Care Team: JJackolyn Confer MD as PCP - General (Internal Medicine)   Assessment:    This is a routine wellness examination for Ian Ross The goal of the wellness visit is to assist the patient how to close the gaps in care and create a preventative care plan for the patient.   Osteoporosis risk discussed.  Taking meds without issues; no barriers identified.   Safety issues reviewed; smoke detectors in the home. Firearms locked in a secure area. Wears seatbelts when driving or riding with others. No violence  in the home.  No identified risk were noted; The patient was oriented x 3; appropriate in dress and manner and no objective failures at ADL's or IADL's.   Hep C screening completed today.  ZOSTAVAX postponed, per patient request.   Patient concerns: C/O new dose of SONATA medication is not working as well for sleeping.  Deferred to PCP for follow up.    Last OV 01/14/15 PCP addressed recurrent acute suppurative otitis media of the R ear without spontaneous rupture of tympanic membrane.  Patient reported completion of LEVAQUIN with some symptoms remaining.  Addressed by PCP during the visit.      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 Increase physical activity     Walk 1 block daily, as tolerated.      Fall Risk Fall Risk  02/15/2015 12/21/2014 08/13/2014 05/20/2014 05/15/2013  Falls in the past year? No No No No No   Depression Screen PHQ 2/9 Scores 02/15/2015 12/21/2014 08/13/2014 05/20/2014  PHQ - 2 Score 0 0 0 0    Cognitive Testing MMSE - Mini Mental State Exam 02/15/2015  Orientation to time 5  Orientation to Place 5  Registration 3  Attention/ Calculation 5  Recall 3  Language- name 2 objects 2  Language- repeat 1  Language- follow 3 step command 3  Language- read & follow direction 1  Write a sentence 1  Copy design 1  Total score 30  Delayed responses at times, but was able to answer correctly.  Immunization History  Administered Date(s) Administered  . Influenza Split 01/16/2012  . Influenza Whole 01/13/2010  . Influenza,inj,Quad PF,36+ Mos 04/09/2014, 01/14/2015  . Influenza-Unspecified 02/12/2013  . Pneumococcal Conjugate-13 05/15/2013  . Pneumococcal Polysaccharide-23 12/06/2010  . Tdap 12/06/2011   Screening Tests Health Maintenance  Topic Date Due  . ZOSTAVAX  02/15/2016 (Originally 05/04/2003)  . INFLUENZA VACCINE  11/02/2015  . COLONOSCOPY  04/04/2019  . TETANUS/TDAP  12/05/2021  . Hepatitis C Screening  Completed  . PNA vac Low Risk Adult  Completed      Plan:    End of life planning was discussed; aging in home or other; Advanced aging;  Unsure if forms were completed by wife.  Refused new forms.  Once located, follow up with a copy of completed HCPOA/Living Will forms or follow up with PCP for new educational material.   Return in 1 year for annual wellness visit with the health coach.  Follow up with PCP as needed.  During the course of the visit the patient was educated and counseled about the following appropriate screening and preventive services:   Vaccines to include  Pneumoccal, Influenza, Hepatitis B, Td, Zostavax, HCV  Electrocardiogram  Cardiovascular Disease  Colorectal cancer screening  Diabetes screening  Prostate Cancer Screening  Glaucoma screening  Nutrition counseling   Smoking cessation counseling  Patient Instructions (the written plan) was given to the patient.    Varney Biles, LPN  25/[id number]

## 2015-02-15 NOTE — Patient Instructions (Addendum)
[name],  Thank you for taking time to come for your Medicare Wellness Visit.  I appreciate your ongoing commitment to your health goals. Please review the following plan we discussed and let me know if I can assist you in the future.

## 2015-02-16 LAB — HEPATITIS C ANTIBODY: HCV Ab: NEGATIVE

## 2015-04-09 ENCOUNTER — Telehealth: Payer: Self-pay | Admitting: Internal Medicine

## 2015-04-09 NOTE — Telephone Encounter (Signed)
Spoke with the patient he has two knots/bumps on his right foot.  One on the top and one of the side that he would like looked at.

## 2015-04-09 NOTE — Telephone Encounter (Signed)
Pt's wife called and lvm asking for a referral to Dr. Cleda Mccreedy at [facility]. Please advise

## 2015-04-09 NOTE — Telephone Encounter (Signed)
Please advise referral?  

## 2015-04-09 NOTE — Telephone Encounter (Signed)
OK. Will need to evaluate here first. We can work him in next week.

## 2015-04-09 NOTE — Telephone Encounter (Signed)
What is this referral for?

## 2015-04-12 NOTE — Telephone Encounter (Signed)
We can work him in any open slot including any 1pm

## 2015-04-12 NOTE — Telephone Encounter (Signed)
Ok. Pt is scheduled for 04/19/2015 '@1pm'$ .

## 2015-04-12 NOTE — Telephone Encounter (Signed)
Can you assist in scheduling, per request, thanks.

## 2015-04-12 NOTE — Telephone Encounter (Signed)
When would you like to see him?

## 2015-04-14 ENCOUNTER — Encounter: Payer: Self-pay | Admitting: Family Medicine

## 2015-04-14 ENCOUNTER — Ambulatory Visit (INDEPENDENT_AMBULATORY_CARE_PROVIDER_SITE_OTHER): Payer: Commercial Managed Care - HMO | Admitting: Family Medicine

## 2015-04-14 VITALS — BP 140/84 | HR 63 | Temp 98.1°F | Ht 74.0 in | Wt 216.1 lb

## 2015-04-14 DIAGNOSIS — M799 Soft tissue disorder, unspecified: Secondary | ICD-10-CM | POA: Insufficient documentation

## 2015-04-14 DIAGNOSIS — M7989 Other specified soft tissue disorders: Secondary | ICD-10-CM

## 2015-04-14 NOTE — Progress Notes (Signed)
   Subjective:  Patient ID: Ian Ross, male    DOB: 18-May-1945  Age: 70 y.o. MRN: [id number]  CC: Knots on R foot  HPI:  70 year old male presents with the above complaint.  Patient states for the past 2 weeks she's had "knots" on the top of his right foot. He states that the arose quickly and spontaneously (no reported trauma, injury).  He reports associated mild pain. He also states that one of them has been slowly enlarging. It is difficult to put on his shoes. No exacerbating or relieving factors.   Social Hx   Social History   Social History  . Marital Status: Married    Spouse Name: N/A  . Number of Children: N/A  . Years of Education: N/A   Social History Main Topics  . Smoking status: Current Some Day Smoker  . Smokeless tobacco: Current User     Comment: only occasionally, chews tobacco  . Alcohol Use: No  . Drug Use: No  . Sexual Activity: No   Other Topics Concern  . None   Social History Narrative   Lives in [place], married, has 2 children (boys), 4 grandchildren.      Work - retired Architect   Review of Systems  Constitutional: Negative.   Skin:       "Knots" on right foot.   Objective:  BP 140/84 mmHg  Pulse 63  Temp(Src) 98.1 F (36.7 C) (Oral)  Ht '6\' 2"'$  (1.88 m)  Wt 216 lb 2 oz (98.034 kg)  BMI 27.74 kg/m2  SpO2 98%  BP/Weight 04/14/2015 02/15/2015 67/67/2094  Systolic BP 709 628 366  Diastolic BP 84 72 69  Wt. (Lbs) 216.13 215.2 216  BMI 27.74 27.62 27.72   Physical Exam  Constitutional: He appears well-developed. No distress.  Pulmonary/Chest: Effort normal.  Musculoskeletal:  Right foot - Dorsum with a single nodule ~ 2.5 cm circumferentially. Lateral surface with a 1.5 cm circumferential nodule. Non fluctuant. Firm.   Neurological: He is alert.  Psychiatric: He has a normal mood and affect.  Vitals reviewed.   Lab Results  Component Value Date   WBC 7.3 09/30/2014   HGB 15.7 09/30/2014   HCT 47.1 09/30/2014   PLT  144* 09/30/2014   GLUCOSE 78 05/20/2014   CHOL 161 05/20/2014   TRIG 202.0* 05/20/2014   HDL 44.50 05/20/2014   LDLDIRECT 93.0 05/20/2014   LDLCALC 78 05/15/2013   ALT 49 05/20/2014   AST 29 05/20/2014   NA 140 05/20/2014   K 4.4 05/20/2014   CL 105 05/20/2014   CREATININE 1.19 05/20/2014   BUN 16 05/20/2014   CO2 26 05/20/2014   TSH 1.30 12/06/2011   PSA 5.17* 05/20/2014   MICROALBUR 1.2 05/20/2014    Assessment & Plan:   Problem List Items Addressed This Visit    Soft tissue lesion of foot - Primary    New problem. Appear to be ganglion cysts. Sending to podiatry for eval and possible aspiration vs surgical removal.       Relevant Orders   Ambulatory referral to Podiatry     Follow-up: PRN  [place]

## 2015-04-14 NOTE — Progress Notes (Signed)
Pre visit review using our clinic review tool, if applicable. No additional management support is needed unless otherwise documented below in the visit note. 

## 2015-04-14 NOTE — Patient Instructions (Signed)
These appear like ganglion cysts.  I am sending you to podiatry.  We will be in touch regarding your appt.  Take care  Dr. Lacinda Axon

## 2015-04-14 NOTE — Assessment & Plan Note (Signed)
New problem. Appear to be ganglion cysts. Sending to podiatry for eval and possible aspiration vs surgical removal.

## 2015-04-19 ENCOUNTER — Ambulatory Visit: Payer: Commercial Managed Care - HMO | Admitting: Internal Medicine

## 2015-04-22 ENCOUNTER — Telehealth: Payer: Self-pay | Admitting: Internal Medicine

## 2015-04-22 NOTE — Telephone Encounter (Signed)
Spoke with pt and he needs a new referral for leg and foot pain. Pt states that Dr. Cleda Mccreedy can not do anything else for him.

## 2015-04-22 NOTE — Telephone Encounter (Signed)
Pt called needing another referral to get shots in the back. He states Dr Humphrey Rolls did all he can do. Pt states his legs still hurt and has not slept in days. Call pt @ [phone]. Thank You!

## 2015-04-22 NOTE — Telephone Encounter (Signed)
He needs to be seen in a visit for this.

## 2015-04-23 NOTE — Telephone Encounter (Signed)
Pt's wife stated that he will be seen on Monday 23 '@4'$ :30 for appt with Dr. Gilford Rile.

## 2015-04-23 NOTE — Telephone Encounter (Signed)
We have to evaluate him prior to the referral.

## 2015-04-23 NOTE — Telephone Encounter (Signed)
Ian Ross was scheduled to come in for an appt for pain in his foot today at 4:30,wife states that the pt did not sleep well b/c of foot pain

## 2015-04-23 NOTE — Telephone Encounter (Signed)
Wife states that pt wants a referral to doctor Dr. Sharlet Salina at [facility] the foot pain is causing his to walk wrong which is now causing back problems, pt not interested in coming in for office visit. Wants to speak to doctor as soon possible.

## 2015-04-26 ENCOUNTER — Ambulatory Visit
Admission: RE | Admit: 2015-04-26 | Discharge: 2015-04-26 | Disposition: A | Discharge status: Home / Self Care | Payer: Commercial Managed Care - HMO | Source: Ambulatory Visit | Attending: Radiation Oncology | Admitting: Radiation Oncology

## 2015-04-26 ENCOUNTER — Encounter: Payer: Self-pay | Admitting: Radiation Oncology

## 2015-04-26 ENCOUNTER — Other Ambulatory Visit: Payer: Self-pay | Admitting: *Deleted

## 2015-04-26 ENCOUNTER — Ambulatory Visit (INDEPENDENT_AMBULATORY_CARE_PROVIDER_SITE_OTHER): Payer: Commercial Managed Care - HMO | Admitting: Internal Medicine

## 2015-04-26 ENCOUNTER — Inpatient Hospital Stay: Payer: Commercial Managed Care - HMO | Attending: Radiation Oncology

## 2015-04-26 ENCOUNTER — Encounter: Payer: Self-pay | Admitting: Internal Medicine

## 2015-04-26 VITALS — BP 143/78 | HR 70 | Temp 95.0°F | Resp 20 | Wt 217.0 lb

## 2015-04-26 VITALS — BP 118/64 | HR 68 | Temp 97.7°F | Ht 74.0 in | Wt 219.8 lb

## 2015-04-26 DIAGNOSIS — C61 Malignant neoplasm of prostate: Secondary | ICD-10-CM

## 2015-04-26 DIAGNOSIS — M79604 Pain in right leg: Secondary | ICD-10-CM | POA: Diagnosis not present

## 2015-04-26 DIAGNOSIS — M6740 Ganglion, unspecified site: Secondary | ICD-10-CM | POA: Diagnosis not present

## 2015-04-26 LAB — CBC
HCT: 47.3 % (ref 40.0–52.0)
Hemoglobin: 16 g/dL (ref 13.0–18.0)
MCH: 33.4 pg (ref 26.0–34.0)
MCHC: 33.9 g/dL (ref 32.0–36.0)
MCV: 98.6 fL (ref 80.0–100.0)
PLATELETS: 133 10*3/uL — AB (ref 150–440)
RBC: 4.8 MIL/uL (ref 4.40–5.90)
RDW: 13.4 % (ref 11.5–14.5)
WBC: 5.1 10*3/uL (ref 3.8–10.6)

## 2015-04-26 LAB — PSA
PSA: 0.48
PSA: 0.48 ng/mL (ref 0.00–4.00)

## 2015-04-26 MED ORDER — HYDROCODONE-ACETAMINOPHEN 5-325 MG PO TABS: 1.0000 | ORAL_TABLET | Freq: Every evening | ORAL | Status: DC | PRN

## 2015-04-26 MED ORDER — MELOXICAM 15 MG PO TABS: 15.0000 mg | ORAL_TABLET | Freq: Every day | ORAL | Status: DC

## 2015-04-26 NOTE — Patient Instructions (Addendum)
Start Meloxicam '15mg'$  daily.  Do not take ibuprofen or aleve with meloxicam.  Start Hydrocodone at bedtime as needed for severe pain.  We will set up evaluation with Dr. Sharlet Salina.

## 2015-04-26 NOTE — Progress Notes (Signed)
Radiation Oncology Follow up Note  Name: Ian Ross   Date:   04/26/2015 MRN:  [id number] DOB: [birth year]    This 70 y.o. male presents to the clinic today for follow-up for prostate cancer stage IIa now 5 months out.  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 He is seen today in routine follow-up and is doing well. He specifically denies diarrhea dysuria or any other GI/GU complaints..  COMPLICATIONS OF TREATMENT: none  FOLLOW UP COMPLIANCE: keeps appointments   PHYSICAL EXAM:  BP 143/78 mmHg  Pulse 70  Temp(Src) 95 F (35 C)  Resp 20  Wt 217 lb 0.7 oz (98.45 kg) On rectal exam rectal sphincter tone is good. Prostate is smooth contracted without evidence of nodularity or mass. Sulcus is preserved bilaterally. No discrete nodularity is identified. No other rectal abnormalities are noted. Well-developed well-nourished patient in NAD. HEENT reveals PERLA, EOMI, discs not visualized.  Oral cavity is clear. No oral mucosal lesions are identified. Neck is clear without evidence of cervical or supraclavicular adenopathy. Lungs are clear to A&P. Cardiac examination is essentially unremarkable with regular rate and rhythm without murmur rub or thrill. Abdomen is benign with no organomegaly or masses noted. Motor sensory and DTR levels are equal and symmetric in the upper and lower extremities. Cranial nerves II through XII are grossly intact. Proprioception is intact. No peripheral adenopathy or edema is identified. No motor or sensory levels are noted. Crude visual fields are within normal range.  RADIOLOGY RESULTS: No films for review  PLAN: At the present time clinically he is doing well have run a PSA level on him today and will report that separately. Otherwise I'm please was overall  progress. I have asked to see him back in 6 months for follow-up. Patient knows to call sooner with any concerns. He will check on his PSAs early next week.  I would like to take this opportunity for allowing me to participate in the care of your patient.Armstead Peaks., MD

## 2015-04-26 NOTE — Progress Notes (Signed)
Subjective:    Patient ID: Ian Ross, male    DOB: 1945/10/08, 70 y.o.   MRN: [id number]  HPI 70YO male presents for acute visit. He is very angry at the "shit" he has experienced with potential delay in referral.  Developed a ganglion cyst on right foot. Seen by podiatry, Dr. Jens Som. Discussed removal of the cysts, however felt that pain might be related to back issues. Recommended referral to Dr. Sharlet Salina.  Pain - described as sharp pain, radiates down from back and goes down to foot. Other times, seems to be coming from foot. Started 3 weeks ago, however in past about 3 years ago, had similar symptoms. Had steroid with Dr. Sharlet Salina with some improvement. Had an MRI of lumbar spine in past. Taking neurontin '300mg'$  at bedtime with minimal improvement. Also taking Advil.   Wt Readings from Last 3 Encounters:  04/26/15 219 lb 12 oz (99.678 kg)  04/26/15 217 lb 0.7 oz (98.45 kg)  04/14/15 216 lb 2 oz (98.034 kg)   BP Readings from Last 3 Encounters:  04/26/15 118/64  04/26/15 143/78  04/14/15 140/84    Past Medical History  Diagnosis Date  . Chicken pox   . Depression   . Dementia   . Testicular cyst     Dr. Jacqlyn Larsen  . Arthritis   . Gout   . Prostate cancer [facility])    Family History  Problem Relation Age of Onset  . Cancer Mother     breast  . Heart disease Mother   . Cancer Father     prostate  . Heart disease Father    Past Surgical History  Procedure Laterality Date  . Knee arthroscopy      Dr. Tamala Julian   Social History   Social History  . Marital Status: Married    Spouse Name: N/A  . Number of Children: N/A  . Years of Education: N/A   Social History Main Topics  . Smoking status: Current Every Day Smoker -- 0.25 packs/day    Types: Cigarettes  . Smokeless tobacco: Current User    Types: Chew     Comment: chews tobacco  . Alcohol Use: 0.0 oz/week    0 Standard drinks or equivalent per week     Comment: occassionally  . Drug Use: No  . Sexual  Activity: No   Other Topics Concern  . None   Social History Narrative   Lives in [place], married, has 2 children (boys), 4 grandchildren.      Work - retired Architect    Review of Systems  Constitutional: Negative for fever, chills, activity change, appetite change, fatigue and unexpected weight change.  Eyes: Negative for visual disturbance.  Respiratory: Negative for cough and shortness of breath.   Cardiovascular: Negative for chest pain, palpitations and leg swelling.  Gastrointestinal: Negative for abdominal pain and abdominal distention.  Genitourinary: Negative for dysuria, urgency and difficulty urinating.  Musculoskeletal: Positive for myalgias, back pain and arthralgias. Negative for gait problem.  Skin: Negative for color change and rash.  Neurological: Positive for weakness. Negative for numbness and headaches.  Hematological: Negative for adenopathy.  Psychiatric/Behavioral: Negative for sleep disturbance and dysphoric mood. The patient is not nervous/anxious.        Objective:    BP 118/64 mmHg  Pulse 68  Temp(Src) 97.7 F (36.5 C) (Oral)  Ht '6\' 2"'$  (1.88 m)  Wt 219 lb 12 oz (99.678 kg)  BMI 28.20 kg/m2  SpO2 96% Physical Exam  Constitutional:  He is oriented to person, place, and time. He appears well-developed and well-nourished. No distress.  HENT:  Head: Normocephalic and atraumatic.  Right Ear: External ear normal.  Left Ear: External ear normal.  Nose: Nose normal.  Mouth/Throat: Oropharynx is clear and moist. No oropharyngeal exudate.  Eyes: Conjunctivae and EOM are normal. Pupils are equal, round, and reactive to light. Right eye exhibits no discharge. Left eye exhibits no discharge. No scleral icterus.  Neck: Normal range of motion. Neck supple. No tracheal deviation present. No thyromegaly present.  Cardiovascular: Normal rate, regular rhythm and normal heart sounds.  Exam reveals no gallop and no friction rub.   No murmur  heard. Pulmonary/Chest: Effort normal and breath sounds normal. No accessory muscle usage. No tachypnea. No respiratory distress. He has no decreased breath sounds. He has no wheezes. He has no rhonchi. He has no rales. He exhibits no tenderness.  Musculoskeletal: Normal range of motion. He exhibits no edema.       Right ankle: He exhibits normal range of motion and no swelling. No tenderness. Achilles tendon exhibits no pain.       Lumbar back: He exhibits tenderness and pain. He exhibits normal range of motion and no bony tenderness.       Back:       Feet:  Lymphadenopathy:    He has no cervical adenopathy.  Neurological: He is alert and oriented to person, place, and time. No cranial nerve deficit. Coordination normal.  Skin: Skin is warm and dry. No rash noted. He is not diaphoretic. No erythema. No pallor.  Psychiatric: He has a normal mood and affect. His behavior is normal. Judgment and thought content normal.          Assessment & Plan:   Problem List Items Addressed This Visit      Unprioritized   Ganglion cyst    Right foot ganglion cysts. Reviewed notes from podiatry. He is considering surgical excision.      Right leg pain - Primary    Right leg pain most consistent with lumbar radiculopathy. Will request previous MRI report. Referral back to Dr. Sharlet Salina, who has evaluated him for this in the past. Add Meloxicam. Add Hydrocodone for severe pain at night. Stop Gabapentin, as no improvement with this even at higher doses. Follow up 4 weeks.      Relevant Medications   HYDROcodone-acetaminophen (NORCO/VICODIN) 5-325 MG tablet   meloxicam (MOBIC) 15 MG tablet   Other Relevant Orders   Ambulatory referral to Pain Clinic       Return in about 4 weeks (around 05/24/2015) for Recheck.

## 2015-04-26 NOTE — Assessment & Plan Note (Signed)
Right foot ganglion cysts. Reviewed notes from podiatry. He is considering surgical excision.

## 2015-04-26 NOTE — Assessment & Plan Note (Signed)
Right leg pain most consistent with lumbar radiculopathy. Will request previous MRI report. Referral back to Dr. Sharlet Salina, who has evaluated him for this in the past. Add Meloxicam. Add Hydrocodone for severe pain at night. Stop Gabapentin, as no improvement with this even at higher doses. Follow up 4 weeks.

## 2015-04-26 NOTE — Progress Notes (Signed)
Pre visit review using our clinic review tool, if applicable. No additional management support is needed unless otherwise documented below in the visit note. 

## 2015-04-28 ENCOUNTER — Encounter: Payer: Commercial Managed Care - HMO | Admitting: Internal Medicine

## 2015-05-05 ENCOUNTER — Encounter: Payer: Self-pay | Admitting: Internal Medicine

## 2015-05-05 ENCOUNTER — Ambulatory Visit (INDEPENDENT_AMBULATORY_CARE_PROVIDER_SITE_OTHER): Payer: Commercial Managed Care - HMO | Admitting: Internal Medicine

## 2015-05-05 VITALS — BP 128/72 | HR 90 | Temp 98.0°F | Ht 73.0 in | Wt 221.1 lb

## 2015-05-05 DIAGNOSIS — Z0000 Encounter for general adult medical examination without abnormal findings: Secondary | ICD-10-CM | POA: Insufficient documentation

## 2015-05-05 DIAGNOSIS — R7989 Other specified abnormal findings of blood chemistry: Secondary | ICD-10-CM | POA: Diagnosis not present

## 2015-05-05 LAB — COMPREHENSIVE METABOLIC PANEL
ALT: 33 U/L (ref 0–53)
AST: 31 U/L (ref 0–37)
Albumin: 4.3 g/dL (ref 3.5–5.2)
Alkaline Phosphatase: 72 U/L (ref 39–117)
BUN: 13 mg/dL (ref 6–23)
CHLORIDE: 105 meq/L (ref 96–112)
CO2: 21 meq/L (ref 19–32)
Calcium: 9.6 mg/dL (ref 8.4–10.5)
Creatinine, Ser: 1.05 mg/dL (ref 0.40–1.50)
GFR: 73.79 mL/min (ref >60.00)
Glucose, Bld: 135 mg/dL — ABNORMAL HIGH (ref 70–99)
Potassium: 3.8 mEq/L (ref 3.5–5.1)
Sodium: 139 mEq/L (ref 135–145)
Total Bilirubin: 0.6 mg/dL (ref 0.2–1.2)
Total Protein: 7.5 g/dL (ref 6.0–8.3)

## 2015-05-05 LAB — LIPID PANEL
CHOL/HDL RATIO: 3
Cholesterol: 182 mg/dL (ref 0–200)
HDL: 58.1 mg/dL (ref >39.00)
NonHDL: 123.83
Triglycerides: 224 mg/dL — ABNORMAL HIGH (ref 0.0–149.0)
VLDL: 44.8 mg/dL — AB (ref 0.0–40.0)

## 2015-05-05 LAB — LDL CHOLESTEROL, DIRECT: Direct LDL: 91 mg/dL

## 2015-05-05 MED ORDER — SERTRALINE HCL 100 MG PO TABS: 100.0000 mg | ORAL_TABLET | Freq: Every day | ORAL | Status: DC

## 2015-05-05 MED ORDER — DONEPEZIL HCL 5 MG PO TABS: 5.0000 mg | ORAL_TABLET | Freq: Every day | ORAL | Status: DC

## 2015-05-05 NOTE — Assessment & Plan Note (Signed)
General medical exam normal today. Prostate exam recently completed by oncology. PSA was 0.48. Encouraged healthy diet and smoking cessation. Labs today. Immunizations UTD. Follow up in 4 weeks.

## 2015-05-05 NOTE — Patient Instructions (Signed)

## 2015-05-05 NOTE — Progress Notes (Signed)
Subjective:    Patient ID: Ian Ross, male    DOB: 11/03/1945, 70 y.o.   MRN: [id number]  HPI  70YO male presents for physical exam.  Recently seen for leg pain. Symptoms improved with use of prn Hydrocodone. Planning to see Dr. Sharlet Salina tomorrow.  Also recently had PSA checked, 0.48. Follow up pending with oncology in 6 months.  Appetite good.  Energy level good. Working in [employer].  Wt Readings from Last 3 Encounters:  05/05/15 221 lb 2 oz (100.302 kg)  04/26/15 219 lb 12 oz (99.678 kg)  04/26/15 217 lb 0.7 oz (98.45 kg)   BP Readings from Last 3 Encounters:  05/05/15 128/72  04/26/15 118/64  04/26/15 143/78    Past Medical History  Diagnosis Date  . Chicken pox   . Depression   . Dementia   . Testicular cyst     Dr. Jacqlyn Larsen  . Arthritis   . Gout   . Prostate cancer [facility])    Family History  Problem Relation Age of Onset  . Cancer Mother     breast  . Heart disease Mother   . Cancer Father     prostate  . Heart disease Father    Past Surgical History  Procedure Laterality Date  . Knee arthroscopy      Dr. Tamala Julian   Social History   Social History  . Marital Status: Married    Spouse Name: N/A  . Number of Children: N/A  . Years of Education: N/A   Social History Main Topics  . Smoking status: Current Every Day Smoker -- 0.25 packs/day    Types: Cigarettes  . Smokeless tobacco: Current User    Types: Chew     Comment: chews tobacco  . Alcohol Use: 0.0 oz/week    0 Standard drinks or equivalent per week     Comment: occassionally  . Drug Use: No  . Sexual Activity: No   Other Topics Concern  . None   Social History Narrative   Lives in [place], married, has 2 children (boys), 4 grandchildren.      Work - retired Architect    Review of Systems  Constitutional: Negative for fever, chills, activity change, appetite change, fatigue and unexpected weight change.  Eyes: Negative for visual disturbance.  Respiratory: Negative  for cough and shortness of breath.   Cardiovascular: Negative for chest pain, palpitations and leg swelling.  Gastrointestinal: Negative for nausea, vomiting, abdominal pain, diarrhea, constipation and abdominal distention.  Genitourinary: Negative for dysuria, urgency and difficulty urinating.  Musculoskeletal: Positive for myalgias, back pain and arthralgias. Negative for gait problem.  Skin: Negative for color change and rash.  Neurological: Negative for weakness.  Hematological: Negative for adenopathy.  Psychiatric/Behavioral: Negative for sleep disturbance and dysphoric mood. The patient is not nervous/anxious.        Objective:    BP 128/72 mmHg  Pulse 90  Temp(Src) 98 F (36.7 C) (Oral)  Ht '6\' 1"'$  (1.854 m)  Wt 221 lb 2 oz (100.302 kg)  BMI 29.18 kg/m2  SpO2 98% Physical Exam  Constitutional: He is oriented to person, place, and time. He appears well-developed and well-nourished. No distress.  HENT:  Head: Normocephalic and atraumatic.  Right Ear: External ear normal.  Left Ear: External ear normal.  Nose: Nose normal.  Mouth/Throat: Oropharynx is clear and moist. No oropharyngeal exudate.  Eyes: Conjunctivae and EOM are normal. Pupils are equal, round, and reactive to light. Right eye exhibits no discharge. Left eye  exhibits no discharge. No scleral icterus.  Neck: Normal range of motion. Neck supple. No tracheal deviation present. No thyromegaly present.  Cardiovascular: Normal rate, regular rhythm and normal heart sounds.  Exam reveals no gallop and no friction rub.   No murmur heard. Pulmonary/Chest: Effort normal and breath sounds normal. No respiratory distress. He has no wheezes. He has no rales. He exhibits no tenderness.  Abdominal: Soft. Bowel sounds are normal. He exhibits no distension and no mass. There is no tenderness. There is no rebound and no guarding.  Musculoskeletal: Normal range of motion. He exhibits no edema.  Lymphadenopathy:    He has no cervical  adenopathy.  Neurological: He is alert and oriented to person, place, and time. No cranial nerve deficit. Coordination normal.  Skin: Skin is warm and dry. No rash noted. He is not diaphoretic. No erythema. No pallor.  Psychiatric: He has a normal mood and affect. His behavior is normal. Judgment and thought content normal.          Assessment & Plan:   Problem List Items Addressed This Visit      Unprioritized   Routine general medical examination at a health care facility - Primary    General medical exam normal today. Prostate exam recently completed by oncology. PSA was 0.48. Encouraged healthy diet and smoking cessation. Labs today. Immunizations UTD. Follow up in 4 weeks.      Relevant Orders   Comprehensive metabolic panel   Lipid panel       Return in about 4 weeks (around 06/02/2015) for Recheck.

## 2015-05-05 NOTE — Progress Notes (Signed)
Pre visit review using our clinic review tool, if applicable. No additional management support is needed unless otherwise documented below in the visit note. 

## 2015-05-06 ENCOUNTER — Other Ambulatory Visit: Payer: Self-pay | Admitting: Physical Medicine and Rehabilitation

## 2015-05-06 DIAGNOSIS — M5416 Radiculopathy, lumbar region: Secondary | ICD-10-CM

## 2015-05-26 ENCOUNTER — Ambulatory Visit
Admission: RE | Admit: 2015-05-26 | Discharge: 2015-05-26 | Disposition: A | Discharge status: Home / Self Care | Payer: Commercial Managed Care - HMO | Source: Ambulatory Visit | Attending: Physical Medicine and Rehabilitation | Admitting: Physical Medicine and Rehabilitation

## 2015-05-26 DIAGNOSIS — M47896 Other spondylosis, lumbar region: Secondary | ICD-10-CM | POA: Diagnosis not present

## 2015-05-26 DIAGNOSIS — M5416 Radiculopathy, lumbar region: Secondary | ICD-10-CM | POA: Diagnosis present

## 2015-05-26 DIAGNOSIS — M5136 Other intervertebral disc degeneration, lumbar region: Secondary | ICD-10-CM | POA: Insufficient documentation

## 2015-07-06 ENCOUNTER — Telehealth: Payer: Self-pay | Admitting: *Deleted

## 2015-07-06 NOTE — Telephone Encounter (Signed)
Notified patient that it is time to have lung cancer screening scan done. Verified patient's age, no signs of lung cancer, no illness that would prevent patient from receiving treatment for lung cancer, and smoking history (current smoker, 60.5 pack year history). Patient is expecting call from scheduling with appointment for screening scan and knows to call me with any questions. Initial shared decision making visit was done 06/05/14.

## 2015-08-06 ENCOUNTER — Encounter: Payer: Self-pay | Admitting: Family Medicine

## 2015-08-06 ENCOUNTER — Other Ambulatory Visit: Payer: Self-pay | Admitting: Family Medicine

## 2015-08-06 DIAGNOSIS — Z87891 Personal history of nicotine dependence: Secondary | ICD-10-CM

## 2015-08-06 HISTORY — DX: Personal history of nicotine dependence: Z87.891

## 2015-08-27 ENCOUNTER — Ambulatory Visit
Admission: RE | Admit: 2015-08-27 | Discharge: 2015-08-27 | Disposition: A | Discharge status: Home / Self Care | Payer: Commercial Managed Care - HMO | Source: Ambulatory Visit | Attending: Family Medicine | Admitting: Family Medicine

## 2015-08-27 DIAGNOSIS — Z87891 Personal history of nicotine dependence: Secondary | ICD-10-CM | POA: Insufficient documentation

## 2015-08-27 DIAGNOSIS — S2232XA Fracture of one rib, left side, initial encounter for closed fracture: Secondary | ICD-10-CM | POA: Insufficient documentation

## 2015-08-27 DIAGNOSIS — R911 Solitary pulmonary nodule: Secondary | ICD-10-CM | POA: Insufficient documentation

## 2015-08-27 DIAGNOSIS — X58XXXA Exposure to other specified factors, initial encounter: Secondary | ICD-10-CM | POA: Diagnosis not present

## 2015-09-07 ENCOUNTER — Telehealth: Payer: Self-pay | Admitting: *Deleted

## 2015-09-07 NOTE — Telephone Encounter (Signed)
OK. Noted. Will follow up in 09/2015 as scheduled.

## 2015-09-07 NOTE — Telephone Encounter (Signed)
Notified patient of LDCT lung cancer screening results with recommendation for 6 month follow up imaging. Also notified of incidental finding noted below. Patient verbalizes understanding. Report will be sent to PCP and patient is encouraged to discuss incidental finding with PCP.  IMPRESSION: 1. Lung-Rads category 3S, probably benign findings. Short-term follow-up in 6 months is recommended with repeat low-dose chest CT without contrast (please use the following order, "CT CHEST LCS NODULE FOLLOW-UP W/O CM"). 2. The "S" modifier above refers to potentially clinically significant non lung cancer related findings. Specifically, 2 vessel coronary atherosclerosis. 3. 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

## 2015-09-08 ENCOUNTER — Encounter: Payer: Self-pay | Admitting: Internal Medicine

## 2015-09-08 ENCOUNTER — Ambulatory Visit (INDEPENDENT_AMBULATORY_CARE_PROVIDER_SITE_OTHER): Payer: Commercial Managed Care - HMO | Admitting: Internal Medicine

## 2015-09-08 VITALS — BP 154/68 | HR 76 | Ht 74.0 in | Wt 215.2 lb

## 2015-09-08 DIAGNOSIS — R911 Solitary pulmonary nodule: Secondary | ICD-10-CM | POA: Diagnosis not present

## 2015-09-08 DIAGNOSIS — I2510 Atherosclerotic heart disease of native coronary artery without angina pectoris: Secondary | ICD-10-CM | POA: Diagnosis not present

## 2015-09-08 DIAGNOSIS — E538 Deficiency of other specified B group vitamins: Secondary | ICD-10-CM | POA: Diagnosis not present

## 2015-09-08 DIAGNOSIS — M6740 Ganglion, unspecified site: Secondary | ICD-10-CM | POA: Diagnosis not present

## 2015-09-08 MED ORDER — CYANOCOBALAMIN 1000 MCG/ML IJ SOLN: 1000.0000 ug | INTRAMUSCULAR | Status: DC

## 2015-09-08 NOTE — Assessment & Plan Note (Signed)
Reviewed previous notes from Dr. Cleda Mccreedy. Will set up follow up with Dr. Cleda Mccreedy for possible aspiration and/or excision.

## 2015-09-08 NOTE — Assessment & Plan Note (Signed)
Reviewed CT chest with pt, showing 5.53m nodule in left lung. Plan for repeat CT chest in 6 months.

## 2015-09-08 NOTE — Progress Notes (Signed)
Subjective:    Patient ID: Ian Ross, male    DOB: 03/15/1946, 70 y.o.   MRN: [id number]  HPI  70YO male presents for acute visit.  Ganglion cyst right foot - getting larger. Sometimes painful with burning sensation. Not taking anything for pain. Previously seen by Dr. Cleda Mccreedy for this.  CT chest recently completed. Noted to have 5.50m nodule in left lung. Pleanning or repeat scan in 6 months. Also noted to have CAD on the CT. No recent chest pain. Occasional shortness of breath on exertion.    Wt Readings from Last 3 Encounters:  09/08/15 215 lb 3.2 oz (97.614 kg)  08/27/15 220 lb (99.791 kg)  05/05/15 221 lb 2 oz (100.302 kg)   BP Readings from Last 3 Encounters:  09/08/15 154/68  05/05/15 128/72  04/26/15 118/64    Past Medical History  Diagnosis Date  . Chicken pox   . Depression   . Dementia   . Testicular cyst     Dr. CJacqlyn Larsen . Arthritis   . Gout   [place]   . Personal history of tobacco use, presenting hazards to health 08/06/2015   Family History  Problem Relation Age of Onset  . Cancer Mother     breast  . Heart disease Mother   . Cancer Father     prostate  . Heart disease Father    Past Surgical History  Procedure Laterality Date  . Knee arthroscopy      Dr. STamala Julian  Social History   Social History  . Marital Status: Married    Spouse Name: N/A  . Number of Children: N/A  . Years of Education: N/A   Social History Main Topics  . Smoking status: Current Every Day Smoker -- 0.25 packs/day    Types: Cigarettes  . Smokeless tobacco: Current User    Types: Chew     Comment: chews tobacco  . Alcohol Use: 0.0 oz/week    0 Standard drinks or equivalent per week     Comment: occassionally  . Drug Use: No  . Sexual Activity: No   Other Topics Concern  . None   Social History Narrative   [place], has 2 children (boys), 4 grandchildren.      Work - retired cArchitect   Review of Systems    Constitutional: Negative for fever, chills, activity change, appetite change, fatigue and unexpected weight change.  Eyes: Negative for visual disturbance.  Respiratory: Positive for shortness of breath. Negative for cough, chest tightness and wheezing.   Cardiovascular: Negative for chest pain, palpitations and leg swelling.  Gastrointestinal: Negative for nausea, vomiting, abdominal pain, diarrhea, constipation and abdominal distention.  Genitourinary: Negative for dysuria, urgency and difficulty urinating.  Musculoskeletal: Positive for arthralgias. Negative for gait problem.  Skin: Positive for color change. Negative for rash.  Hematological: Negative for adenopathy.  Psychiatric/Behavioral: Negative for sleep disturbance and dysphoric mood. The patient is not nervous/anxious.        Objective:    BP 154/68 mmHg  Pulse 76  Ht '6\' 2"'$  (1.88 m)  Wt 215 lb 3.2 oz (97.614 kg)  BMI 27.62 kg/m2  SpO2 96% Physical Exam  Constitutional: He is oriented to person, place, and time. He appears well-developed and well-nourished. No distress.  HENT:  Head: Normocephalic and atraumatic.  Right Ear: External ear normal.  Left Ear: External ear normal.  Nose: Nose normal.  Mouth/Throat: Oropharynx is clear and moist. No oropharyngeal exudate.  Eyes: Conjunctivae and EOM are normal. Pupils are equal, round, and reactive to light. Right eye exhibits no discharge. Left eye exhibits no discharge. No scleral icterus.  Neck: Normal range of motion. Neck supple. No tracheal deviation present. No thyromegaly present.  Cardiovascular: Normal rate, regular rhythm and normal heart sounds.  Exam reveals no gallop and no friction rub.   No murmur heard. Pulmonary/Chest: Effort normal and breath sounds normal. No accessory muscle usage. No tachypnea. No respiratory distress. He has no decreased breath sounds. He has no wheezes. He has no rhonchi. He has no rales. He exhibits no tenderness.  Musculoskeletal:  Normal range of motion. He exhibits no edema.       Feet:  Lymphadenopathy:    He has no cervical adenopathy.  Neurological: He is alert and oriented to person, place, and time. No cranial nerve deficit. Coordination normal.  Skin: Skin is warm and dry. No rash noted. He is not diaphoretic. No erythema. No pallor.  Psychiatric: He has a normal mood and affect. His behavior is normal. Judgment and thought content normal.          Assessment & Plan:   Problem List Items Addressed This Visit      Unprioritized   Atherosclerosis 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    Reviewed recent CT findings of 2V CAD. He has risk for CAD including tobacco abuse. EKG today showed NSR with no acute findings. Will set up cardiology evaluation. Question if stress test might be helpful and question if dyspnea on exertion may be anginal equivalent for him.      Relevant Orders   Ambulatory referral to Cardiology   EKG 12-Lead (Completed)   B12 deficiency   Relevant Medications   cyanocobalamin (,VITAMIN B-12,) 1000 MCG/ML injection   Ganglion cyst - Primary    Reviewed previous notes from Dr. Cleda Mccreedy. Will set up follow up with Dr. Cleda Mccreedy for possible aspiration and/or excision.      Relevant Orders   Ambulatory referral to Podiatry   Solitary pulmonary nodule    Reviewed CT chest with pt, showing 5.15m nodule in left lung. Plan for repeat CT chest in 6 months.          Return in about 3 months (around 12/09/2015) for Recheck.  JRonette Deter MD Internal Medicine [facility]

## 2015-09-08 NOTE — Patient Instructions (Signed)
We will set up evaluation with Dr. Cleda Mccreedy in [place] and with Dr. Nehemiah Massed in Cardiology.  Follow up in 3 months and sooner as needed.

## 2015-09-08 NOTE — Assessment & Plan Note (Signed)
Reviewed recent CT findings of 2V CAD. He has risk for CAD including tobacco abuse. EKG today showed NSR with no acute findings. Will set up cardiology evaluation. Question if stress test might be helpful and question if dyspnea on exertion may be anginal equivalent for him.

## 2015-09-24 ENCOUNTER — Telehealth: Payer: Self-pay | Admitting: Internal Medicine

## 2015-09-24 NOTE — Telephone Encounter (Signed)
Pt dropped off paper work for Dr. Gilford Rile to complete. It is a history of exam. Latoya placed paper in Dr. Thomes Dinning box.

## 2015-09-30 ENCOUNTER — Encounter
Admission: RE | Admit: 2015-09-30 | Discharge: 2015-09-30 | Disposition: A | Discharge status: Home / Self Care | Payer: Commercial Managed Care - HMO | Source: Ambulatory Visit | Attending: Podiatry | Admitting: Podiatry

## 2015-09-30 DIAGNOSIS — Z01812 Encounter for preprocedural laboratory examination: Secondary | ICD-10-CM | POA: Insufficient documentation

## 2015-09-30 HISTORY — DX: Anxiety disorder, unspecified: F41.9

## 2015-09-30 LAB — DIFFERENTIAL
BASOS ABS: 0 10*3/uL (ref 0–0.1)
BASOS PCT: 1 %
EOS ABS: 0.3 10*3/uL (ref 0–0.7)
Eosinophils Relative: 4 %
Lymphocytes Relative: 24 %
Lymphs Abs: 1.7 10*3/uL (ref 1.0–3.6)
MONOS PCT: 5 %
Monocytes Absolute: 0.4 10*3/uL (ref 0.2–1.0)
NEUTROS ABS: 4.9 10*3/uL (ref 1.4–6.5)
NEUTROS PCT: 66 %

## 2015-09-30 LAB — CBC
HEMATOCRIT: 45.1 % (ref 40.0–52.0)
Hemoglobin: 15.4 g/dL (ref 13.0–18.0)
MCH: 33.5 pg (ref 26.0–34.0)
MCHC: 34.2 g/dL (ref 32.0–36.0)
MCV: 98.2 fL (ref 80.0–100.0)
Platelets: 149 10*3/uL — ABNORMAL LOW (ref 150–440)
RBC: 4.59 MIL/uL (ref 4.40–5.90)
RDW: 13 % (ref 11.5–14.5)
WBC: 7.4 10*3/uL (ref 3.8–10.6)

## 2015-09-30 NOTE — Patient Instructions (Signed)
  Your procedure is scheduled on: October 08, 2015  (Friday) Report to Day Surgery.  [facility]  SECOND FLOOR To find out your arrival time please call [phone] between 1PM - 3PM on October 07, 2015 (Thursday).  Remember: Instructions that are not followed completely may result in serious medical risk, up to and including death, or upon the discretion of your surgeon and anesthesiologist your surgery may need to be rescheduled.    __x__ 1. Do not eat food or drink liquids after midnight. No gum chewing or hard candies.     __x__ 2. No Alcohol for 24 hours before or after surgery.   __x_ 3. Do Not Smoke For 24 Hours Prior to Your Surgery.   ____ 4. Bring all medications with you on the day of surgery if instructed.    __x__ 5. Notify your doctor if there is any change in your medical condition     (cold, fever, infections).       Do not wear jewelry, make-up, hairpins, clips or nail polish.  Do not wear lotions, powders, or perfumes. You may wear deodorant.  Do not shave 48 hours prior to surgery. Men may shave face and neck.  Do not bring valuables to the hospital.    [facility] is not responsible for any belongings or valuables.               Contacts, dentures or bridgework may not be worn into surgery.  Leave your suitcase in the car. After surgery it may be brought to your room.  For patients admitted to the hospital, discharge time is determined by your                treatment team.   Patients discharged the day of surgery will not be allowed to drive home.   Please read over the following fact sheets that you were given:   Surgical Site Infection Prevention   ____ Take these medicines the morning of surgery with A SIP OF WATER:    1.   2.   3.   4.  5.  6.  ____ Fleet Enema (as directed)   _x___ Use CHG Soap as directed  ____ Use inhalers on the day of surgery  ____ Stop metformin 2 days prior to surgery    ____ Take 1/2 of usual insulin dose the night before  surgery and none on the morning of surgery.   __x__ Stop Coumadin/Plavix/aspirin on  (Stop Aspirin on June 30)  __x__ Stop Anti-inflammatories on (Stop Mobic now) NO NSAIDS, Tylenol ok to take for pain if needed   ____ Stop supplements until after surgery.    ____ Bring C-Pap to the hospital.

## 2015-10-08 ENCOUNTER — Ambulatory Visit
Admission: RE | Admit: 2015-10-08 | Discharge: 2015-10-08 | Disposition: A | Discharge status: Home / Self Care | Payer: Commercial Managed Care - HMO | Source: Ambulatory Visit | Attending: Podiatry | Admitting: Podiatry

## 2015-10-08 ENCOUNTER — Ambulatory Visit: Payer: Commercial Managed Care - HMO | Admitting: Anesthesiology

## 2015-10-08 ENCOUNTER — Encounter
Admission: RE | Disposition: A | Discharge status: Home / Self Care | Payer: Self-pay | Source: Ambulatory Visit | Attending: Podiatry

## 2015-10-08 ENCOUNTER — Encounter: Payer: Self-pay | Admitting: *Deleted

## 2015-10-08 DIAGNOSIS — M67471 Ganglion, right ankle and foot: Secondary | ICD-10-CM | POA: Insufficient documentation

## 2015-10-08 DIAGNOSIS — M1A9XX1 Chronic gout, unspecified, with tophus (tophi): Secondary | ICD-10-CM | POA: Diagnosis present

## 2015-10-08 DIAGNOSIS — Z885 Allergy status to narcotic agent status: Secondary | ICD-10-CM | POA: Insufficient documentation

## 2015-10-08 HISTORY — PX: GANGLION CYST EXCISION: SHX1691

## 2015-10-08 SURGERY — EXCISION, GANGLION CYST, FOOT
Anesthesia: General | Site: Foot | Laterality: Right | Wound class: Clean

## 2015-10-08 MED ORDER — BUPIVACAINE HCL (PF) 0.5 % IJ SOLN
INTRAMUSCULAR | Status: AC
  Filled 2015-10-08: qty 30

## 2015-10-08 MED ORDER — DEXAMETHASONE SODIUM PHOSPHATE 10 MG/ML IJ SOLN
INTRAMUSCULAR | Status: DC | PRN
  Administered 2015-10-08: 5 mg via INTRAVENOUS

## 2015-10-08 MED ORDER — LACTATED RINGERS IV SOLN
INTRAVENOUS | Status: DC
  Administered 2015-10-08: 09:00:00 via INTRAVENOUS

## 2015-10-08 MED ORDER — FENTANYL CITRATE (PF) 100 MCG/2ML IJ SOLN: 25.0000 ug | INTRAMUSCULAR | Status: DC | PRN

## 2015-10-08 MED ORDER — LIDOCAINE HCL (CARDIAC) 20 MG/ML IV SOLN
INTRAVENOUS | Status: DC | PRN
  Administered 2015-10-08: 100 mg via INTRAVENOUS

## 2015-10-08 MED ORDER — BUPIVACAINE HCL (PF) 0.5 % IJ SOLN
INTRAMUSCULAR | Status: DC | PRN
  Administered 2015-10-08: 10 mL

## 2015-10-08 MED ORDER — HYDROCODONE-ACETAMINOPHEN 5-325 MG PO TABS
1.0000 | ORAL_TABLET | ORAL | Status: DC | PRN
  Administered 2015-10-08: 1 via ORAL

## 2015-10-08 MED ORDER — MIDAZOLAM HCL 2 MG/2ML IJ SOLN
INTRAMUSCULAR | Status: DC | PRN
  Administered 2015-10-08: 2 mg via INTRAVENOUS

## 2015-10-08 MED ORDER — ONDANSETRON HCL 4 MG/2ML IJ SOLN
INTRAMUSCULAR | Status: DC | PRN
  Administered 2015-10-08: 4 mg via INTRAVENOUS

## 2015-10-08 MED ORDER — PROPOFOL 10 MG/ML IV BOLUS
INTRAVENOUS | Status: DC | PRN
  Administered 2015-10-08: 50 mg via INTRAVENOUS

## 2015-10-08 MED ORDER — FENTANYL CITRATE (PF) 100 MCG/2ML IJ SOLN
INTRAMUSCULAR | Status: DC | PRN
  Administered 2015-10-08 (×3): 25 ug via INTRAVENOUS

## 2015-10-08 MED ORDER — PROPOFOL 500 MG/50ML IV EMUL
INTRAVENOUS | Status: DC | PRN
  Administered 2015-10-08: 150 ug/kg/min via INTRAVENOUS

## 2015-10-08 MED ORDER — FAMOTIDINE 20 MG PO TABS
ORAL_TABLET | ORAL | Status: AC
  Filled 2015-10-08: qty 1

## 2015-10-08 MED ORDER — ONDANSETRON HCL 4 MG/2ML IJ SOLN
4.0000 mg | Freq: Once | INTRAMUSCULAR | Status: DC | PRN
Start: 2015-10-08 — End: 2015-10-08

## 2015-10-08 MED ORDER — HYDROCODONE-ACETAMINOPHEN 5-325 MG PO TABS
ORAL_TABLET | ORAL | Status: AC
  Filled 2015-10-08: qty 1

## 2015-10-08 MED ORDER — HYDROCODONE-ACETAMINOPHEN 5-325 MG PO TABS: 1.0000 | ORAL_TABLET | ORAL | Status: DC | PRN

## 2015-10-08 MED ORDER — FAMOTIDINE 20 MG PO TABS
20.0000 mg | ORAL_TABLET | Freq: Once | ORAL | Status: AC
  Administered 2015-10-08: 20 mg via ORAL

## 2015-10-08 SURGICAL SUPPLY — 30 items
BANDAGE ELASTIC 4 LF NS (GAUZE/BANDAGES/DRESSINGS) ×2 IMPLANT
BANDAGE STRETCH 3X4.1 STRL (GAUZE/BANDAGES/DRESSINGS) ×2 IMPLANT
BNDG ESMARK 4X12 TAN STRL LF (GAUZE/BANDAGES/DRESSINGS) ×2 IMPLANT
BNDG GAUZE 4.5X4.1 6PLY STRL (MISCELLANEOUS) ×2 IMPLANT
CANISTER SUCT 1200ML W/VALVE (MISCELLANEOUS) ×2 IMPLANT
CUFF TOURN 18 STER (MISCELLANEOUS) ×2 IMPLANT
CUFF TOURN DUAL PL 12 NO SLV (MISCELLANEOUS) IMPLANT
DURAPREP 26ML APPLICATOR (WOUND CARE) ×2 IMPLANT
ELECT REM PT RETURN 9FT ADLT (ELECTROSURGICAL) ×2
ELECTRODE REM PT RTRN 9FT ADLT (ELECTROSURGICAL) ×1 IMPLANT
GAUZE PETRO XEROFOAM 1X8 (MISCELLANEOUS) IMPLANT
GAUZE SPONGE 4X4 12PLY STRL (GAUZE/BANDAGES/DRESSINGS) ×2 IMPLANT
GLOVE BIO SURGEON STRL SZ7.5 (GLOVE) ×6 IMPLANT
GLOVE INDICATOR 8.0 STRL GRN (GLOVE) ×6 IMPLANT
GOWN STRL REUS W/ TWL LRG LVL3 (GOWN DISPOSABLE) ×3 IMPLANT
GOWN STRL REUS W/TWL LRG LVL3 (GOWN DISPOSABLE) ×3
KIT RM TURNOVER STRD PROC AR (KITS) ×2 IMPLANT
LABEL OR SOLS (LABEL) IMPLANT
NDL SAFETY 25GX1.5 (NEEDLE) ×2 IMPLANT
NS IRRIG 500ML POUR BTL (IV SOLUTION) ×2 IMPLANT
PACK EXTREMITY ARMC (MISCELLANEOUS) ×2 IMPLANT
PENCIL ELECTRO HAND CTR (MISCELLANEOUS) ×2 IMPLANT
SOL PREP PVP 2OZ (MISCELLANEOUS) ×2
SOLUTION PREP PVP 2OZ (MISCELLANEOUS) ×1 IMPLANT
STOCKINETTE STRL 6IN 960660 (GAUZE/BANDAGES/DRESSINGS) ×2 IMPLANT
STRIP CLOSURE SKIN 1/4X4 (GAUZE/BANDAGES/DRESSINGS) ×2 IMPLANT
SUT ETHILON 5-0 FS-2 18 BLK (SUTURE) ×2 IMPLANT
SUT VIC AB 4-0 FS2 27 (SUTURE) ×4 IMPLANT
SWABSTK COMLB BENZOIN TINCTURE (MISCELLANEOUS) ×2 IMPLANT
SYRINGE 10CC LL (SYRINGE) ×2 IMPLANT

## 2015-10-08 NOTE — Op Note (Signed)
Date of operation: 10/08/2015.  Surgeon: Durward Fortes DPM.  Preoperative diagnosis: Ganglionic cyst 2 right foot.  Postoperative diagnosis: Same, with gouty tophi  Procedures: Excision ganglionic cyst dorsum of right foot and lateral right midfoot.  Anesthesia: Local Mac.  Hemostasis: Pneumatic tourniquet right ankle 250 mmHg.  Estimated blood loss: Less than 5 cc.  Pathology: Ganglion cyst 2 right foot.  Complications: None apparent.  Operative indications: This is a 70 year old male with a history of painful cyst on his right foot. Over the last few months they have enlarged and become more painful and he elects for surgical removal of these areas.  Operative procedure: Patient was taken to the operating room and placed on the table in the supine position. Following satisfactory sedation the right foot was anesthetized with 10 cc of 0.5% Sensorcaine plain around the lesions on the dorsum and lateral aspect of the foot. A pneumatic tourniquet was applied at the level of the right ankle and the foot was prepped and draped in usual sterile fashion. The foot was exsanguinated and the tourniquet inflated to 250 mmHg area  Attention was then directed to the lateral aspect of the right midfoot where an approximate 3 cm lazy S incision was made centered over the cystic area. The incision was deepened via sharp and blunt dissection down to the level of the cystic area which was carefully dissected away from the surrounding anatomy. There was noted to be some apparent white gouty tophi material embedded within the cystic lesion. The lesion was then resected and removed in toto. The wound was flushed with copious amounts of sterile saline and the incision was closed using 4-0 nylon running suture for deep and superficial subcutaneous closure followed by skin closure.  Attention was then directed to the dorsal aspect of the foot where an approximate 4 cm lazy S incision was placed over the cystic  area on the dorsum of the foot. Incision was deepened via sharp and blunt dissection with care taken to retract neurovascular and tendinous structures. At the level of the cyst the cyst was carefully dissected away from the surrounding normal anatomy. Again there was noted be significant gouty tophi embedded within the lesion which was removed in toto. The wound was flushed with copious amounts of sterile saline and the dorsal incision was closed using 4-0 Vicryl running suture for deep and superficial subcutaneous followed by skin closure. Tincture of benzoin and Steri-Strips applied to both of the incision areas followed by a sterile gauze bandage. Tourniquet was released and blood flow noted to return to the right foot and digits. A Kerlix and Ace wrap were then applied to the right lower extremity for compression. Patient tolerated the procedure and anesthesia well and was transported to the PACU with vital signs stable and in good condition.

## 2015-10-08 NOTE — Discharge Instructions (Signed)
1. Elevate right leg on 2 pillows.  2. Keep the bandage on the right foot clean, dry, and do not remove.  3. Sponge bathe only right lower extremity.  4. Wear surgical shoe on the right foot whenever walking or standing.  5. Take one to 2 pain pills, Norco, every 4 hours as needed for painAMBULATORY SURGERY  DISCHARGE INSTRUCTIONS   1) The drugs that you were given will stay in your system until tomorrow so for the next 24 hours you should not:  A) Drive an automobile B) Make any legal decisions C) Drink any alcoholic beverage   2) You may resume regular meals tomorrow.  Today it is better to start with liquids and gradually work up to solid foods.  You may eat anything you prefer, but it is better to start with liquids, then soup and crackers, and gradually work up to solid foods.   3) Please notify your doctor immediately if you have any unusual bleeding, trouble breathing, redness and pain at the surgery site, drainage, fever, or pain not relieved by medication.    4) Additional Instructions:        Please contact your physician with any problems or Same Day Surgery at [phone], Monday through Friday 6 am to 4 pm, or [place] [facility] number at [phone].

## 2015-10-08 NOTE — Interval H&P Note (Signed)
History and Physical Interval Note:  10/08/2015 9:28 AM  Ian Ross  has presented today for surgery, with the diagnosis of Ganglion cysts  M67.479    The various methods of treatment have been discussed with the patient and family. After consideration of risks, benefits and other options for treatment, the patient has consented to  Procedure(s): REMOVAL GANGLION CYST FOOT/2 right foot (Right) as a surgical intervention .  The patient's history has been reviewed, patient examined, no change in status, stable for surgery.  I have reviewed the patient's chart and labs.  Questions were answered to the patient's satisfaction.     Durward Fortes

## 2015-10-08 NOTE — Transfer of Care (Signed)
Immediate Anesthesia Transfer of Care Note  Patient: Ian Ross  Procedure(s) Performed: Procedure(s): REMOVAL GANGLION CYST FOOT/2 right foot (Right)  Patient Location: PACU  Anesthesia Type:General  Level of Consciousness: awake, alert , oriented and patient cooperative  Airway & Oxygen Therapy: Patient Spontanous Breathing and Patient connected to face mask oxygen  Post-op Assessment: Report given to RN, Post -op Vital signs reviewed and stable and Patient moving all extremities X 4  Post vital signs: Reviewed and stable  Last Vitals:  Filed Vitals:   10/08/15 0806  BP: 160/65  Pulse: 62  Temp: 35.6 C  Resp: 16    Last Pain:  Filed Vitals:   10/08/15 1108  PainSc: 3          Complications: No apparent anesthesia complications

## 2015-10-08 NOTE — Anesthesia Preprocedure Evaluation (Signed)
Anesthesia Evaluation  Patient identified by MRN, date of birth, ID band Patient awake    Reviewed: Allergy & Precautions, NPO status , Patient's Chart, lab work & pertinent test results, reviewed documented beta blocker date and time   Airway Mallampati: II  TM Distance: >3 FB     Dental  (+) Chipped   Pulmonary Current Smoker,           Cardiovascular + CAD       Neuro/Psych PSYCHIATRIC DISORDERS Anxiety Depression    GI/Hepatic   Endo/Other    Renal/GU      Musculoskeletal  (+) Arthritis ,   Abdominal   Peds  Hematology   Anesthesia Other Findings Gout.  Reproductive/Obstetrics                             Anesthesia Physical Anesthesia Plan  ASA: III  Anesthesia Plan: MAC   Post-op Pain Management:    Induction: Intravenous  Airway Management Planned: Nasal Cannula  Additional Equipment:   Intra-op Plan:   Post-operative Plan:   Informed Consent: I have reviewed the patients History and Physical, chart, labs and discussed the procedure including the risks, benefits and alternatives for the proposed anesthesia with the patient or authorized representative who has indicated his/her understanding and acceptance.     Plan Discussed with: CRNA  Anesthesia Plan Comments:         Anesthesia Quick Evaluation

## 2015-10-08 NOTE — Anesthesia Postprocedure Evaluation (Signed)
Anesthesia Post Note  Patient: Ian Ross  Procedure(s) Performed: Procedure(s) (LRB): REMOVAL GANGLION CYST FOOT/2 right foot (Right)  Patient location during evaluation: PACU Anesthesia Type: General Level of consciousness: awake and alert Pain management: pain level controlled Vital Signs Assessment: post-procedure vital signs reviewed and stable Respiratory status: spontaneous breathing, nonlabored ventilation, respiratory function stable and patient connected to nasal cannula oxygen Cardiovascular status: blood pressure returned to baseline and stable Postop Assessment: no signs of nausea or vomiting Anesthetic complications: no    Last Vitals:  Filed Vitals:   10/08/15 1200 10/08/15 1228  BP: 179/83 164/77  Pulse: 53 66  Temp: 36.2 C   Resp: 16 16    Last Pain:  Filed Vitals:   10/08/15 1233  PainSc: 4                  Tuyet Bader S

## 2015-10-08 NOTE — H&P (Signed)
  History and physical in the chart was reviewed. No interval changes. Patient stable for surgery

## 2015-10-11 LAB — SURGICAL PATHOLOGY

## 2015-10-14 ENCOUNTER — Other Ambulatory Visit: Payer: Self-pay | Admitting: *Deleted

## 2015-10-25 ENCOUNTER — Inpatient Hospital Stay: Payer: Commercial Managed Care - HMO | Attending: Radiation Oncology

## 2015-10-25 ENCOUNTER — Encounter: Payer: Self-pay | Admitting: Radiation Oncology

## 2015-10-25 ENCOUNTER — Ambulatory Visit
Admission: RE | Admit: 2015-10-25 | Discharge: 2015-10-25 | Disposition: A | Discharge status: Home / Self Care | Payer: Commercial Managed Care - HMO | Source: Ambulatory Visit | Attending: Radiation Oncology | Admitting: Radiation Oncology

## 2015-10-25 ENCOUNTER — Encounter (INDEPENDENT_AMBULATORY_CARE_PROVIDER_SITE_OTHER): Payer: Self-pay

## 2015-10-25 VITALS — BP 129/69 | HR 73 | Temp 96.9°F | Wt 208.8 lb

## 2015-10-25 DIAGNOSIS — Z923 Personal history of irradiation: Secondary | ICD-10-CM | POA: Insufficient documentation

## 2015-10-25 DIAGNOSIS — C61 Malignant neoplasm of prostate: Secondary | ICD-10-CM | POA: Diagnosis present

## 2015-10-25 LAB — PSA: PSA: 0.35 ng/mL (ref 0.00–4.00)

## 2015-10-25 NOTE — Progress Notes (Signed)
Radiation Oncology Follow up Note  Name: Ian Ross   Date:   10/25/2015 MRN:  [id number] DOB: 1945/05/28    This 70 y.o. 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  REFERRING PROVIDER: Jackolyn Confer, MD  HPI: Patient is a 70 year old male now out close to year having completed IM RT radiation therapy to his prostate for stage IIa (T1 CN 0 M0) presenting with a Gleason score of 7 (4+3) and a PSA of 5. He is seen today in routine follow-up and is doing well. He specifically denies diarrhea dysuria or any other GI/GU complaints.. His last PSA G in January 2017 was 0.48.  COMPLICATIONS OF TREATMENT: none  FOLLOW UP COMPLIANCE: keeps appointments   PHYSICAL EXAM:  BP 129/69   Pulse 73   Temp (!) 96.9 F (36.1 C)   Wt 208 lb 12.4 oz (94.7 kg)   BMI 26.81 kg/m  On rectal exam rectal sphincter tone is good. Prostate is smooth contracted without evidence of nodularity or mass. Sulcus is preserved bilaterally. No discrete nodularity is identified. No other rectal abnormalities are noted. Well-developed well-nourished patient in NAD. HEENT reveals PERLA, EOMI, discs not visualized.  Oral cavity is clear. No oral mucosal lesions are identified. Neck is clear without evidence of cervical or supraclavicular adenopathy. Lungs are clear to A&P. Cardiac examination is essentially unremarkable with regular rate and rhythm without murmur rub or thrill. Abdomen is benign with no organomegaly or masses noted. Motor sensory and DTR levels are equal and symmetric in the upper and lower extremities. Cranial nerves II through XII are grossly intact. Proprioception is intact. No peripheral adenopathy or edema is identified. No motor or sensory levels are noted. Crude visual fields are within normal range.  RADIOLOGY RESULTS: No current films for review  PLAN: At the present time patient is doing extremely well. Will review his PSA's and report that  separately. Otherwise I've asked to see him back in 6 months. PSAs remain stable will go to once your follow-up visits. Patient is to call with any concerns.  I would like to take this opportunity to thank you for allowing me to participate in the care of your patient.Armstead Peaks., MD

## 2015-12-17 ENCOUNTER — Ambulatory Visit: Payer: Commercial Managed Care - HMO | Admitting: Internal Medicine

## 2015-12-24 ENCOUNTER — Other Ambulatory Visit: Payer: Self-pay

## 2015-12-24 MED ORDER — SERTRALINE HCL 100 MG PO TABS: 100.0000 mg | ORAL_TABLET | Freq: Every day | ORAL | 4 refills | Status: DC

## 2016-02-08 ENCOUNTER — Telehealth: Payer: Self-pay | Admitting: *Deleted

## 2016-02-08 NOTE — Telephone Encounter (Signed)
Notified patient that lung cancer screening follow up CT scan is due. Confirmed that patient is within the age range of 55-77, and asymptomatic, (no signs or symptoms of lung cancer). Patient denies illness that would prevent curative treatment for lung cancer if found. The patient is a current smoker, with a 60.5 pack year history. The shared decision making visit was done 06/05/14. Patient is agreeable for CT scan being scheduled, pending insurance approval.

## 2016-02-15 ENCOUNTER — Ambulatory Visit: Payer: Commercial Managed Care - HMO

## 2016-03-29 ENCOUNTER — Other Ambulatory Visit: Payer: Self-pay | Admitting: *Deleted

## 2016-03-29 DIAGNOSIS — R9389 Abnormal findings on diagnostic imaging of other specified body structures: Secondary | ICD-10-CM

## 2016-04-13 ENCOUNTER — Ambulatory Visit: Payer: PPO

## 2016-04-18 DIAGNOSIS — M5416 Radiculopathy, lumbar region: Secondary | ICD-10-CM | POA: Diagnosis not present

## 2016-04-18 DIAGNOSIS — M5136 Other intervertebral disc degeneration, lumbar region: Secondary | ICD-10-CM | POA: Diagnosis not present

## 2016-05-03 ENCOUNTER — Ambulatory Visit
Admission: RE | Admit: 2016-05-03 | Discharge: 2016-05-03 | Disposition: A | Discharge status: Home / Self Care | Payer: PPO | Source: Ambulatory Visit | Attending: Oncology | Admitting: Oncology

## 2016-05-03 DIAGNOSIS — R918 Other nonspecific abnormal finding of lung field: Secondary | ICD-10-CM | POA: Insufficient documentation

## 2016-05-03 DIAGNOSIS — Z122 Encounter for screening for malignant neoplasm of respiratory organs: Secondary | ICD-10-CM | POA: Insufficient documentation

## 2016-05-03 DIAGNOSIS — F17200 Nicotine dependence, unspecified, uncomplicated: Secondary | ICD-10-CM | POA: Diagnosis not present

## 2016-05-03 DIAGNOSIS — M47894 Other spondylosis, thoracic region: Secondary | ICD-10-CM | POA: Insufficient documentation

## 2016-05-03 DIAGNOSIS — M4814 Ankylosing hyperostosis [Forestier], thoracic region: Secondary | ICD-10-CM | POA: Insufficient documentation

## 2016-05-03 DIAGNOSIS — I700 Atherosclerosis of aorta: Secondary | ICD-10-CM | POA: Insufficient documentation

## 2016-05-03 DIAGNOSIS — I2510 Atherosclerotic heart disease of native coronary artery without angina pectoris: Secondary | ICD-10-CM | POA: Insufficient documentation

## 2016-05-03 DIAGNOSIS — Z8546 Personal history of malignant neoplasm of prostate: Secondary | ICD-10-CM | POA: Diagnosis not present

## 2016-05-03 DIAGNOSIS — R9389 Abnormal findings on diagnostic imaging of other specified body structures: Secondary | ICD-10-CM

## 2016-05-03 DIAGNOSIS — C3490 Malignant neoplasm of unspecified part of unspecified bronchus or lung: Secondary | ICD-10-CM | POA: Diagnosis not present

## 2016-05-05 ENCOUNTER — Encounter: Payer: Self-pay | Admitting: *Deleted

## 2016-05-19 DIAGNOSIS — C61 Malignant neoplasm of prostate: Secondary | ICD-10-CM | POA: Diagnosis not present

## 2016-05-19 DIAGNOSIS — R911 Solitary pulmonary nodule: Secondary | ICD-10-CM | POA: Diagnosis not present

## 2016-05-19 DIAGNOSIS — E538 Deficiency of other specified B group vitamins: Secondary | ICD-10-CM | POA: Diagnosis not present

## 2016-05-19 DIAGNOSIS — F325 Major depressive disorder, single episode, in full remission: Secondary | ICD-10-CM | POA: Diagnosis not present

## 2016-05-19 DIAGNOSIS — Z720 Tobacco use: Secondary | ICD-10-CM | POA: Diagnosis not present

## 2016-05-19 DIAGNOSIS — I2510 Atherosclerotic heart disease of native coronary artery without angina pectoris: Secondary | ICD-10-CM | POA: Diagnosis not present

## 2016-05-19 DIAGNOSIS — F5101 Primary insomnia: Secondary | ICD-10-CM | POA: Diagnosis not present

## 2016-05-29 DIAGNOSIS — M1711 Unilateral primary osteoarthritis, right knee: Secondary | ICD-10-CM | POA: Diagnosis not present

## 2016-05-29 DIAGNOSIS — M5416 Radiculopathy, lumbar region: Secondary | ICD-10-CM | POA: Diagnosis not present

## 2016-05-29 DIAGNOSIS — M5136 Other intervertebral disc degeneration, lumbar region: Secondary | ICD-10-CM | POA: Diagnosis not present

## 2016-09-08 DIAGNOSIS — G5791 Unspecified mononeuropathy of right lower limb: Secondary | ICD-10-CM | POA: Diagnosis not present

## 2016-09-08 DIAGNOSIS — G5792 Unspecified mononeuropathy of left lower limb: Secondary | ICD-10-CM | POA: Diagnosis not present

## 2016-09-11 DIAGNOSIS — G609 Hereditary and idiopathic neuropathy, unspecified: Secondary | ICD-10-CM | POA: Diagnosis not present

## 2016-09-11 DIAGNOSIS — M5416 Radiculopathy, lumbar region: Secondary | ICD-10-CM | POA: Diagnosis not present

## 2016-09-11 DIAGNOSIS — M1711 Unilateral primary osteoarthritis, right knee: Secondary | ICD-10-CM | POA: Diagnosis not present

## 2016-09-11 DIAGNOSIS — M5136 Other intervertebral disc degeneration, lumbar region: Secondary | ICD-10-CM | POA: Diagnosis not present

## 2016-10-30 ENCOUNTER — Other Ambulatory Visit: Payer: Self-pay

## 2016-10-30 ENCOUNTER — Inpatient Hospital Stay: Payer: PPO | Attending: Radiation Oncology

## 2016-10-30 DIAGNOSIS — C61 Malignant neoplasm of prostate: Secondary | ICD-10-CM | POA: Diagnosis not present

## 2016-10-30 LAB — PSA: Prostatic Specific Antigen: 0.13 ng/mL (ref 0.00–4.00)

## 2016-11-06 ENCOUNTER — Encounter: Payer: Self-pay | Admitting: Radiation Oncology

## 2016-11-06 ENCOUNTER — Ambulatory Visit
Admission: RE | Admit: 2016-11-06 | Discharge: 2016-11-06 | Disposition: A | Discharge status: Home / Self Care | Payer: PPO | Source: Ambulatory Visit | Attending: Radiation Oncology | Admitting: Radiation Oncology

## 2016-11-06 VITALS — BP 144/67 | HR 70 | Temp 97.3°F | Resp 18 | Wt 209.1 lb

## 2016-11-06 DIAGNOSIS — C61 Malignant neoplasm of prostate: Secondary | ICD-10-CM

## 2016-11-06 DIAGNOSIS — Z923 Personal history of irradiation: Secondary | ICD-10-CM | POA: Insufficient documentation

## 2016-11-06 NOTE — Progress Notes (Signed)
Radiation Oncology Follow up Note  Name: Ian Ross   Date:   11/06/2016 MRN:  [id number] DOB: 08-22-1945    This 71 y.o. male presents to the clinic today for 2 year follow-up status post I MRT radiation therapy to his prostate for stage IIa adenocarcinoma.  REFERRING PROVIDER: Jackolyn Confer, MD  HPI: patient is a 71 year old male underwent image guided radiation therapy to his prostate for stage IIa (T1 CN 0 M0) adenocarcinoma the prostate Gleason score of 7 (4+3) and a PSA of 5. Seen today in routine follow-up he is doing well. He specifically denies diarrhea dysuria or any other GI/GU complaints. His last PSA last week was 0.13.Marland Kitchen  COMPLICATIONS OF TREATMENT: none  FOLLOW UP COMPLIANCE: keeps appointments   PHYSICAL EXAM:  BP (!) 144/67   Pulse 70   Temp (!) 97.3 F (36.3 C)   Resp 18   Wt 209 lb 1.7 oz (94.8 kg)   BMI 26.85 kg/m  On rectal exam rectal sphincter tone is good. Prostate is smooth contracted without evidence of nodularity or mass. Sulcus is preserved bilaterally. No discrete nodularity is identified. No other rectal abnormalities are noted. Well-developed well-nourished patient in NAD. HEENT reveals PERLA, EOMI, discs not visualized.  Oral cavity is clear. No oral mucosal lesions are identified. Neck is clear without evidence of cervical or supraclavicular adenopathy. Lungs are clear to A&P. Cardiac examination is essentially unremarkable with regular rate and rhythm without murmur rub or thrill. Abdomen is benign with no organomegaly or masses noted. Motor sensory and DTR levels are equal and symmetric in the upper and lower extremities. Cranial nerves II through XII are grossly intact. Proprioception is intact. No peripheral adenopathy or edema is identified. No motor or sensory levels are noted. Crude visual fields are within normal range.  RADIOLOGY RESULTS: no current films for review  PLAN: present time patient is doing well under excellent biochemical  control of his prostate cancer. He has very little side effects or complaints. I've asked to see him back in 1 year for follow-up in of ordered a PSA prior to his visit. Patient knows to call with any concerns.  I would like to take this opportunity to thank you for allowing me to participate in the care of your patient.Armstead Peaks., MD

## 2016-11-14 DIAGNOSIS — Z125 Encounter for screening for malignant neoplasm of prostate: Secondary | ICD-10-CM | POA: Diagnosis not present

## 2016-11-14 DIAGNOSIS — Z0000 Encounter for general adult medical examination without abnormal findings: Secondary | ICD-10-CM | POA: Diagnosis not present

## 2016-11-20 DIAGNOSIS — M5136 Other intervertebral disc degeneration, lumbar region: Secondary | ICD-10-CM | POA: Diagnosis not present

## 2016-11-20 DIAGNOSIS — M1711 Unilateral primary osteoarthritis, right knee: Secondary | ICD-10-CM | POA: Diagnosis not present

## 2016-11-20 DIAGNOSIS — G609 Hereditary and idiopathic neuropathy, unspecified: Secondary | ICD-10-CM | POA: Diagnosis not present

## 2016-11-20 DIAGNOSIS — M67471 Ganglion, right ankle and foot: Secondary | ICD-10-CM | POA: Diagnosis not present

## 2016-11-20 DIAGNOSIS — M67479 Ganglion, unspecified ankle and foot: Secondary | ICD-10-CM | POA: Diagnosis not present

## 2016-11-20 DIAGNOSIS — M898X9 Other specified disorders of bone, unspecified site: Secondary | ICD-10-CM | POA: Diagnosis not present

## 2016-11-20 DIAGNOSIS — M79671 Pain in right foot: Secondary | ICD-10-CM | POA: Diagnosis not present

## 2016-11-20 DIAGNOSIS — M5416 Radiculopathy, lumbar region: Secondary | ICD-10-CM | POA: Diagnosis not present

## 2016-11-21 DIAGNOSIS — R911 Solitary pulmonary nodule: Secondary | ICD-10-CM | POA: Diagnosis not present

## 2016-11-21 DIAGNOSIS — E781 Pure hyperglyceridemia: Secondary | ICD-10-CM | POA: Diagnosis not present

## 2016-11-21 DIAGNOSIS — E538 Deficiency of other specified B group vitamins: Secondary | ICD-10-CM | POA: Diagnosis not present

## 2016-11-21 DIAGNOSIS — C61 Malignant neoplasm of prostate: Secondary | ICD-10-CM | POA: Diagnosis not present

## 2016-11-21 DIAGNOSIS — F325 Major depressive disorder, single episode, in full remission: Secondary | ICD-10-CM | POA: Diagnosis not present

## 2016-11-21 DIAGNOSIS — Z0000 Encounter for general adult medical examination without abnormal findings: Secondary | ICD-10-CM | POA: Diagnosis not present

## 2016-11-21 DIAGNOSIS — Z87891 Personal history of nicotine dependence: Secondary | ICD-10-CM | POA: Diagnosis not present

## 2016-11-21 DIAGNOSIS — F5101 Primary insomnia: Secondary | ICD-10-CM | POA: Diagnosis not present

## 2016-12-25 ENCOUNTER — Other Ambulatory Visit: Payer: Self-pay | Admitting: Family Medicine

## 2016-12-25 NOTE — Telephone Encounter (Signed)
Last office 04/14/15 No office visit schedule Last fill 09/29/16

## 2017-01-05 DIAGNOSIS — H2513 Age-related nuclear cataract, bilateral: Secondary | ICD-10-CM | POA: Diagnosis not present

## 2017-02-09 DIAGNOSIS — M5136 Other intervertebral disc degeneration, lumbar region: Secondary | ICD-10-CM | POA: Diagnosis not present

## 2017-02-09 DIAGNOSIS — M5416 Radiculopathy, lumbar region: Secondary | ICD-10-CM | POA: Diagnosis not present

## 2017-04-26 ENCOUNTER — Telehealth: Payer: Self-pay | Admitting: *Deleted

## 2017-04-26 DIAGNOSIS — Z122 Encounter for screening for malignant neoplasm of respiratory organs: Secondary | ICD-10-CM

## 2017-04-26 DIAGNOSIS — Z87891 Personal history of nicotine dependence: Secondary | ICD-10-CM

## 2017-04-26 NOTE — Telephone Encounter (Signed)
Notified patient that annual lung cancer screening low dose CT scan is due currently or will be in near future. Confirmed that patient is within the age range of 55-77, and asymptomatic, (no signs or symptoms of lung cancer). Patient denies illness that would prevent curative treatment for lung cancer if found. Verified smoking history, (current, 60.75 pack year). The shared decision making visit was done 06/05/14. Patient is agreeable for CT scan being scheduled.

## 2017-05-15 DIAGNOSIS — E538 Deficiency of other specified B group vitamins: Secondary | ICD-10-CM | POA: Diagnosis not present

## 2017-05-15 DIAGNOSIS — E781 Pure hyperglyceridemia: Secondary | ICD-10-CM | POA: Diagnosis not present

## 2017-05-15 DIAGNOSIS — C61 Malignant neoplasm of prostate: Secondary | ICD-10-CM | POA: Diagnosis not present

## 2017-05-22 DIAGNOSIS — M25511 Pain in right shoulder: Secondary | ICD-10-CM | POA: Diagnosis not present

## 2017-05-22 DIAGNOSIS — G4700 Insomnia, unspecified: Secondary | ICD-10-CM | POA: Diagnosis not present

## 2017-05-22 DIAGNOSIS — M19011 Primary osteoarthritis, right shoulder: Secondary | ICD-10-CM | POA: Diagnosis not present

## 2017-05-22 DIAGNOSIS — M1712 Unilateral primary osteoarthritis, left knee: Secondary | ICD-10-CM | POA: Diagnosis not present

## 2017-05-22 DIAGNOSIS — Z0000 Encounter for general adult medical examination without abnormal findings: Secondary | ICD-10-CM | POA: Diagnosis not present

## 2017-05-22 DIAGNOSIS — C61 Malignant neoplasm of prostate: Secondary | ICD-10-CM | POA: Diagnosis not present

## 2017-05-22 DIAGNOSIS — F325 Major depressive disorder, single episode, in full remission: Secondary | ICD-10-CM | POA: Diagnosis not present

## 2017-05-22 DIAGNOSIS — M542 Cervicalgia: Secondary | ICD-10-CM | POA: Diagnosis not present

## 2017-05-22 DIAGNOSIS — M47812 Spondylosis without myelopathy or radiculopathy, cervical region: Secondary | ICD-10-CM | POA: Diagnosis not present

## 2017-05-23 DIAGNOSIS — M67471 Ganglion, right ankle and foot: Secondary | ICD-10-CM | POA: Diagnosis not present

## 2017-05-29 ENCOUNTER — Ambulatory Visit
Admission: RE | Admit: 2017-05-29 | Discharge: 2017-05-29 | Disposition: A | Discharge status: Home / Self Care | Payer: PPO | Source: Ambulatory Visit | Attending: Nurse Practitioner | Admitting: Nurse Practitioner

## 2017-05-29 DIAGNOSIS — Z122 Encounter for screening for malignant neoplasm of respiratory organs: Secondary | ICD-10-CM

## 2017-05-29 DIAGNOSIS — I2510 Atherosclerotic heart disease of native coronary artery without angina pectoris: Secondary | ICD-10-CM | POA: Diagnosis not present

## 2017-05-29 DIAGNOSIS — J439 Emphysema, unspecified: Secondary | ICD-10-CM | POA: Diagnosis not present

## 2017-05-29 DIAGNOSIS — Z87891 Personal history of nicotine dependence: Secondary | ICD-10-CM | POA: Diagnosis not present

## 2017-05-29 DIAGNOSIS — I700 Atherosclerosis of aorta: Secondary | ICD-10-CM | POA: Diagnosis not present

## 2017-05-29 DIAGNOSIS — F17210 Nicotine dependence, cigarettes, uncomplicated: Secondary | ICD-10-CM | POA: Diagnosis not present

## 2017-06-04 ENCOUNTER — Encounter: Payer: Self-pay | Admitting: *Deleted

## 2017-09-05 DIAGNOSIS — M19011 Primary osteoarthritis, right shoulder: Secondary | ICD-10-CM | POA: Diagnosis not present

## 2017-10-29 DIAGNOSIS — M5030 Other cervical disc degeneration, unspecified cervical region: Secondary | ICD-10-CM | POA: Diagnosis not present

## 2017-10-29 DIAGNOSIS — M62838 Other muscle spasm: Secondary | ICD-10-CM | POA: Diagnosis not present

## 2017-10-29 DIAGNOSIS — M19011 Primary osteoarthritis, right shoulder: Secondary | ICD-10-CM | POA: Diagnosis not present

## 2017-10-29 DIAGNOSIS — M5412 Radiculopathy, cervical region: Secondary | ICD-10-CM | POA: Diagnosis not present

## 2017-11-02 ENCOUNTER — Other Ambulatory Visit: Payer: Self-pay | Admitting: *Deleted

## 2017-11-02 DIAGNOSIS — C61 Malignant neoplasm of prostate: Secondary | ICD-10-CM

## 2017-11-05 ENCOUNTER — Inpatient Hospital Stay: Payer: PPO | Attending: Radiation Oncology

## 2017-11-12 ENCOUNTER — Other Ambulatory Visit: Payer: Self-pay

## 2017-11-12 ENCOUNTER — Ambulatory Visit
Admission: RE | Admit: 2017-11-12 | Discharge: 2017-11-12 | Disposition: A | Discharge status: Home / Self Care | Payer: PPO | Source: Ambulatory Visit | Attending: Radiation Oncology | Admitting: Radiation Oncology

## 2017-11-12 ENCOUNTER — Inpatient Hospital Stay: Payer: PPO | Attending: Radiation Oncology

## 2017-11-12 ENCOUNTER — Encounter: Payer: Self-pay | Admitting: Radiation Oncology

## 2017-11-12 VITALS — BP 162/81 | HR 73 | Temp 96.4°F | Wt 200.5 lb

## 2017-11-12 DIAGNOSIS — C61 Malignant neoplasm of prostate: Secondary | ICD-10-CM | POA: Diagnosis not present

## 2017-11-12 DIAGNOSIS — Z923 Personal history of irradiation: Secondary | ICD-10-CM | POA: Diagnosis not present

## 2017-11-12 LAB — PSA: Prostatic Specific Antigen: 0.11 ng/mL (ref 0.00–4.00)

## 2017-11-12 NOTE — Progress Notes (Signed)
Radiation Oncology Follow up Note  Name: Ian Ross   Date:   11/12/2017 MRN:  [id number] DOB: 1945-06-09    This 72 y.o. male presents to the clinic today for 3 year follow-up status post I MRT radiation therapy for stage IIa adenocarcinoma the prostate.  REFERRING PROVIDER: Rusty Aus, MD  HPI: patient is a 72 year old male now out 3 years having completed IM RT radiation therapy for stage IIa (T1 CN 0 M0) adenocarcinoma the prostate Gleason 7 (4+3) presenting the PSA of 5. Seen today in routine follow-up he is doing well. He specifically denies diarrhea dysuria or any other GI/GU complaints.Marland Kitchene had a PSA drawn this this morning. His most recent PSA back 6 once prior was 0.1.  COMPLICATIONS OF TREATMENT: none  FOLLOW UP COMPLIANCE: keeps appointments   PHYSICAL EXAM:  BP (!) 162/81 (BP Location: Right Arm, Patient Position: Sitting)   Pulse 73   Temp (!) 96.4 F (35.8 C) (Tympanic)   Wt 200 lb 8.1 oz (90.9 kg)   BMI 25.74 kg/m  Well-developed well-nourished patient in NAD. HEENT reveals PERLA, EOMI, discs not visualized.  Oral cavity is clear. No oral mucosal lesions are identified. Neck is clear without evidence of cervical or supraclavicular adenopathy. Lungs are clear to A&P. Cardiac examination is essentially unremarkable with regular rate and rhythm without murmur rub or thrill. Abdomen is benign with no organomegaly or masses noted. Motor sensory and DTR levels are equal and symmetric in the upper and lower extremities. Cranial nerves II through XII are grossly intact. Proprioception is intact. No peripheral adenopathy or edema is identified. No motor or sensory levels are noted. Crude visual fields are within normal range.  RADIOLOGY RESULTS: no current films for review  PLAN: the present time patient is doing well under good biochemical control of his prostate cancer. I will report his PSA separately. I've asked to see him back in 1 year for follow-up and a PSA prior to  that visit. Patient is to call with any concerns at any time.  I would like to take this opportunity to thank you for allowing me to participate in the care of your patient.Noreene Filbert, MD

## 2017-11-16 ENCOUNTER — Ambulatory Visit (INDEPENDENT_AMBULATORY_CARE_PROVIDER_SITE_OTHER): Payer: PPO

## 2017-11-16 ENCOUNTER — Ambulatory Visit: Payer: PPO | Admitting: Podiatry

## 2017-11-16 ENCOUNTER — Encounter

## 2017-11-16 ENCOUNTER — Encounter: Payer: Self-pay | Admitting: Podiatry

## 2017-11-16 ENCOUNTER — Other Ambulatory Visit: Payer: Self-pay | Admitting: Podiatry

## 2017-11-16 VITALS — BP 150/78 | HR 63 | Resp 16

## 2017-11-16 DIAGNOSIS — M898X7 Other specified disorders of bone, ankle and foot: Secondary | ICD-10-CM | POA: Diagnosis not present

## 2017-11-16 DIAGNOSIS — M65172 Other infective (teno)synovitis, left ankle and foot: Secondary | ICD-10-CM | POA: Diagnosis not present

## 2017-11-16 DIAGNOSIS — M67471 Ganglion, right ankle and foot: Secondary | ICD-10-CM | POA: Diagnosis not present

## 2017-11-16 DIAGNOSIS — M779 Enthesopathy, unspecified: Secondary | ICD-10-CM

## 2017-11-16 DIAGNOSIS — M7752 Other enthesopathy of left foot: Secondary | ICD-10-CM

## 2017-11-16 DIAGNOSIS — M778 Other enthesopathies, not elsewhere classified: Secondary | ICD-10-CM

## 2017-11-16 DIAGNOSIS — M67472 Ganglion, left ankle and foot: Secondary | ICD-10-CM

## 2017-11-16 DIAGNOSIS — M659 Synovitis and tenosynovitis, unspecified: Secondary | ICD-10-CM

## 2017-11-16 MED ORDER — MELOXICAM 15 MG PO TABS: 15.0000 mg | ORAL_TABLET | Freq: Every day | ORAL | 1 refills | Status: AC

## 2017-11-16 NOTE — Patient Instructions (Signed)
Pre-Operative Instructions  Congratulations, you have decided to take an important step towards improving your quality of life.  You can be assured that the doctors and staff at [facility] will be with you every step of the way.  Here are some important things you should know:  1. Plan to be at the surgery center/hospital at least 1 (one) hour prior to your scheduled time, unless otherwise directed by the surgical center/hospital staff.  You must have a responsible adult accompany you, remain during the surgery and drive you home.  Make sure you have directions to the surgical center/hospital to ensure you arrive on time. 2. If you are having surgery at [facility] or [facility] hospitals, you will need a copy of your medical history and physical form from your family physician within one month prior to the date of surgery. We will give you a form for your primary physician to complete.  3. We make every effort to accommodate the date you request for surgery.  However, there are times where surgery dates or times have to be moved.  We will contact you as soon as possible if a change in schedule is required.   4. No aspirin/ibuprofen for one week before surgery.  If you are on aspirin, any non-steroidal anti-inflammatory medications (Mobic, Aleve, Ibuprofen) should not be taken seven (7) days prior to your surgery.  You make take Tylenol for pain prior to surgery.  5. Medications - If you are taking daily heart and blood pressure medications, seizure, reflux, allergy, asthma, anxiety, pain or diabetes medications, make sure you notify the surgery center/hospital before the day of surgery so they can tell you which medications you should take or avoid the day of surgery. 6. No food or drink after midnight the night before surgery unless directed otherwise by surgical center/hospital staff. 7. No alcoholic beverages 24-hours prior to surgery.  No smoking 24-hours prior or 24-hours after  surgery. 8. Wear loose pants or shorts. They should be loose enough to fit over bandages, boots, and casts. 9. Don't wear slip-on shoes. Sneakers are preferred. 10. Bring your boot with you to the surgery center/hospital.  Also bring crutches or a walker if your physician has prescribed it for you.  If you do not have this equipment, it will be provided for you after surgery. 11. If you have not been contacted by the surgery center/hospital by the day before your surgery, call to confirm the date and time of your surgery. 12. Leave-time from work may vary depending on the type of surgery you have.  Appropriate arrangements should be made prior to surgery with your employer. 13. Prescriptions will be provided immediately following surgery by your doctor.  Fill these as soon as possible after surgery and take the medication as directed. Pain medications will not be refilled on weekends and must be approved by the doctor. 14. Remove nail polish on the operative foot and avoid getting pedicures prior to surgery. 15. Wash the night before surgery.  The night before surgery wash the foot and leg well with water and the antibacterial soap provided. Be sure to pay special attention to beneath the toenails and in between the toes.  Wash for at least three (3) minutes. Rinse thoroughly with water and dry well with a towel.  Perform this wash unless told not to do so by your physician.  Enclosed: 1 Ice pack (please put in freezer the night before surgery)   1 Hibiclens skin cleaner     Pre-op instructions  If you have any questions regarding the instructions, please do not hesitate to call our office.  [place]: [address], [place], [place] [address] -- [phone]  [place]: [address] [place], [place] [address] -- [phone]  [place]: [address]  [place], [place] [address] -- [phone]  [place]: [address], [address], [place],  [address] -- [phone]  Website:  [email or web address] 

## 2017-11-21 NOTE — Progress Notes (Signed)
HPI: 72 year old male presents the office today for evaluation regarding bilateral foot pain.  Patient has a history of lumps on his right foot that is gradually gotten bigger.  He has had them drained at [facility] clinic by Dr. Elvina Mattes and the came back.  Patient says he was diagnosed with a ganglion cyst.  Patient also complains of left ankle pain and foot pain is been going on for approximately 2 months now.  Gradual onset.  No recollection of trauma.  Shoe gear is aggravating.  No alleviating factors  Past Medical History:  Diagnosis Date  . Anxiety   . Arthritis   . Chicken pox   . Dementia   . Depression   . Gout   . Personal history of tobacco use, presenting hazards to health 08/06/2015  . Prostate cancer ([place])   . Testicular cyst    Dr. Jacqlyn Larsen     Physical Exam: General: The patient is alert and oriented x3 in no acute distress.  Dermatology: Skin is warm, dry and supple bilateral lower extremities. Negative for open lesions or macerations.  Vascular: Palpable pedal pulses bilaterally. No edema or erythema noted. Capillary refill within normal limits.  Neurological: Epicritic and protective threshold grossly intact bilaterally.   Musculoskeletal Exam: Range of motion within normal limits to all pedal and ankle joints bilateral. Muscle strength 5/5 in all groups bilateral.  Palpable fluctuant nodules noted to the dorsal aspect of the right foot with some underlying exostosis also noted with palpation.  Consistent with chronic ganglion cyst and likely underlying exostosis and bone spur.  Patient also noted to the medial lateral and anterior aspect of the left ankle joint consistent with an ankle synovitis.  Pain on palpation and range of motion also noted the first metatarsal phalangeal joint with limited range of motion consistent with a hallux limitus left.  Radiographic Exam:  Normal osseous mineralization. Joint spaces preserved. No fracture/dislocation/boney destruction.   There does appear to be some dorsal exostosis noted to the right foot underlying the ganglion cyst areas  Assessment: 1.  Ganglion cyst x2 right foot 2.  Tarsal exostosis right midfoot 3.  Ankle synovitis left 4.  First MTPJ capsulitis/hallux limitus left   Plan of Care:  1. Patient evaluated. X-Rays reviewed.  2.  Injection of 0.5 cc Celestone Soluspan injection of the left ankle joint. 3.  Injection of 0.5 cc Celestone Soluspan injection of the first metatarsal phalangeal joint left. 4.  Today we discussed conservative versus surgical modalities regarding the dorsal exostosis as well as ganglion cyst to the right foot.  Conservative treatments have been unsuccessful to provide any sort of satisfactory alleviation of symptoms for the patient.  The patient would like to proceed with surgical intervention.  All possible complications and details the procedure were explained.  No guarantees were expressed or implied. 5.  Authorization for surgery initiated today.  Surgery will consist of excision of ganglion cyst x2 right foot.  Tarsal exostectomy right foot. 6.  Prescription for meloxicam 15 mg 7.  Return to clinic 1 week postop  *Big into fishing      Edrick Kins, DPM [facility]  Dr. Edrick Kins, DPM    [address], [place] [address]  Office [phone]  Fax [phone]

## 2017-11-29 DIAGNOSIS — M67471 Ganglion, right ankle and foot: Secondary | ICD-10-CM | POA: Diagnosis not present

## 2017-11-29 DIAGNOSIS — M1A0711 Idiopathic chronic gout, right ankle and foot, with tophus (tophi): Secondary | ICD-10-CM | POA: Diagnosis not present

## 2017-11-29 DIAGNOSIS — D1631 Benign neoplasm of short bones of right lower limb: Secondary | ICD-10-CM | POA: Diagnosis not present

## 2017-11-29 DIAGNOSIS — M25571 Pain in right ankle and joints of right foot: Secondary | ICD-10-CM | POA: Diagnosis not present

## 2017-11-29 DIAGNOSIS — M25774 Osteophyte, right foot: Secondary | ICD-10-CM | POA: Diagnosis not present

## 2017-12-07 ENCOUNTER — Encounter: Payer: Self-pay | Admitting: Podiatry

## 2017-12-07 ENCOUNTER — Ambulatory Visit (INDEPENDENT_AMBULATORY_CARE_PROVIDER_SITE_OTHER): Payer: PPO | Admitting: Podiatry

## 2017-12-07 ENCOUNTER — Ambulatory Visit (INDEPENDENT_AMBULATORY_CARE_PROVIDER_SITE_OTHER): Payer: PPO

## 2017-12-07 DIAGNOSIS — L03115 Cellulitis of right lower limb: Secondary | ICD-10-CM

## 2017-12-07 DIAGNOSIS — M898X7 Other specified disorders of bone, ankle and foot: Secondary | ICD-10-CM | POA: Diagnosis not present

## 2017-12-07 DIAGNOSIS — Z9889 Other specified postprocedural states: Secondary | ICD-10-CM

## 2017-12-07 DIAGNOSIS — M67471 Ganglion, right ankle and foot: Secondary | ICD-10-CM

## 2017-12-07 MED ORDER — DOXYCYCLINE HYCLATE 100 MG PO TABS: 100.0000 mg | ORAL_TABLET | Freq: Two times a day (BID) | ORAL | 0 refills | Status: DC

## 2017-12-11 NOTE — Progress Notes (Signed)
   Subjective:  Patient presents today status post excision of Ganglion cysts x 2 of the right foot. DOS: 11/29/17. He reports some soreness of the foot. He reports associated bleeding from the incision site. He has been wearing the post op shoe as directed. There are no modifying factors noted. Patient is here for further evaluation and treatment.    Past Medical History:  Diagnosis Date  . Anxiety   . Arthritis   . Chicken pox   . Dementia   . Depression   . Gout   . Personal history of tobacco use, presenting hazards to health 08/06/2015  . Prostate cancer ([place])   . Testicular cyst    Dr. Jacqlyn Larsen      Objective/Physical Exam Neurovascular status intact.  Skin incisions appear to be well coapted with sutures and staples intact. No sign of infectious process noted. No dehiscence. No active bleeding noted. Moderate edema noted to the surgical extremity.  Radiographic Exam:  Orthopedic hardware and osteotomies sites appear to be stable with routine healing.  Assessment: 1. s/p excision of Ganglion cysts x 2 right foot. DOS: 11/29/17   Plan of Care:  1. Patient was evaluated. X-rays reviewed 2. Dressing changed. Keep clean, dry and intact for one week.  3. Prescription for Doxycycline 100 mg #20 provided to patient.  4. Weightbearing in post op shoe.  5. Return to clinic in one week.    Edrick Kins, DPM [facility]  Dr. Edrick Kins, [address]                                        [place], [place] [address]                Office [phone]  Fax [phone]

## 2017-12-18 ENCOUNTER — Ambulatory Visit (INDEPENDENT_AMBULATORY_CARE_PROVIDER_SITE_OTHER): Payer: PPO | Admitting: Podiatry

## 2017-12-18 ENCOUNTER — Encounter: Payer: Self-pay | Admitting: Podiatry

## 2017-12-18 DIAGNOSIS — Z9889 Other specified postprocedural states: Secondary | ICD-10-CM

## 2017-12-18 DIAGNOSIS — M898X7 Other specified disorders of bone, ankle and foot: Secondary | ICD-10-CM

## 2017-12-18 DIAGNOSIS — M67471 Ganglion, right ankle and foot: Secondary | ICD-10-CM

## 2017-12-18 MED ORDER — SULFAMETHOXAZOLE-TRIMETHOPRIM 800-160 MG PO TABS: 1.0000 | ORAL_TABLET | Freq: Two times a day (BID) | ORAL | 0 refills | Status: DC

## 2017-12-18 MED ORDER — GENTAMICIN SULFATE 0.1 % EX CREA: 1.0000 "application " | TOPICAL_CREAM | Freq: Three times a day (TID) | CUTANEOUS | 1 refills | Status: DC

## 2017-12-20 NOTE — Progress Notes (Signed)
   Subjective:  Patient presents today status post excision of Ganglion cysts x 2 of the right foot. DOS: 11/29/17. He states he is improving. He denies much pain or any modifying factors. He has been taking Doxycycline but reports adverse effects from it. Patient is here for further evaluation and treatment.     Past Medical History:  Diagnosis Date  . Anxiety   . Arthritis   . Chicken pox   . Dementia   . Depression   . Gout   . Personal history of tobacco use, presenting hazards to health 08/06/2015  . Prostate cancer ([place])   . Testicular cyst    Dr. Jacqlyn Larsen      Objective/Physical Exam Neurovascular status intact.  Skin incisions appear to be well coapted with sutures and staples intact. Focal area of dehiscence with drainage and malodor noted along lateral incision. No active bleeding noted. Moderate edema noted to the surgical extremity.  Assessment: 1. s/p excision of Ganglion cysts x 2 right foot. DOS: 11/29/17   Plan of Care:  1. Patient was evaluated.  2. Staples removed.  3. Prescription for Bactrim provided to patient. Discontinue taking Doxycycline secondary to side effects.  4. Prescription for gentamicin cream to be used daily with a bandage provided to patient.  5. Continue using post op shoe.  6. Return to clinic in 2 weeks.     Edrick Kins, DPM [facility]  Dr. Edrick Kins, [address]                                        [place], [place] [address]                Office [phone]  Fax [phone]

## 2017-12-26 DIAGNOSIS — Z0000 Encounter for general adult medical examination without abnormal findings: Secondary | ICD-10-CM | POA: Diagnosis not present

## 2017-12-26 DIAGNOSIS — C61 Malignant neoplasm of prostate: Secondary | ICD-10-CM | POA: Diagnosis not present

## 2017-12-26 DIAGNOSIS — G4700 Insomnia, unspecified: Secondary | ICD-10-CM | POA: Diagnosis not present

## 2017-12-26 DIAGNOSIS — E781 Pure hyperglyceridemia: Secondary | ICD-10-CM | POA: Diagnosis not present

## 2017-12-26 DIAGNOSIS — Z1159 Encounter for screening for other viral diseases: Secondary | ICD-10-CM | POA: Diagnosis not present

## 2017-12-26 DIAGNOSIS — I10 Essential (primary) hypertension: Secondary | ICD-10-CM | POA: Diagnosis not present

## 2017-12-26 DIAGNOSIS — F5101 Primary insomnia: Secondary | ICD-10-CM | POA: Diagnosis not present

## 2018-01-01 ENCOUNTER — Ambulatory Visit (INDEPENDENT_AMBULATORY_CARE_PROVIDER_SITE_OTHER): Payer: PPO

## 2018-01-01 ENCOUNTER — Encounter: Payer: Self-pay | Admitting: Podiatry

## 2018-01-01 ENCOUNTER — Ambulatory Visit (INDEPENDENT_AMBULATORY_CARE_PROVIDER_SITE_OTHER): Payer: PPO | Admitting: Podiatry

## 2018-01-01 DIAGNOSIS — Z9889 Other specified postprocedural states: Secondary | ICD-10-CM

## 2018-01-01 DIAGNOSIS — M67471 Ganglion, right ankle and foot: Secondary | ICD-10-CM | POA: Diagnosis not present

## 2018-01-03 ENCOUNTER — Encounter: Payer: Self-pay | Admitting: Podiatry

## 2018-01-03 NOTE — Progress Notes (Signed)
DOS: 11-29-2017 Excision of Ganglion cyst X2, Exostectomy; RT  Dr. Amalia Hailey  [facility]

## 2018-01-04 NOTE — Progress Notes (Signed)
   Subjective:  Patient presents today status post excision of Ganglion cysts x 2 of the right foot. DOS: 11/29/17.  Patient denies pain.  Patient has no new concerns today.  Patient believes the areas are healing well.    Past Medical History:  Diagnosis Date  . Anxiety   . Arthritis   . Chicken pox   . Dementia ([place])   . Depression   . Gout   . Personal history of tobacco use, presenting hazards to health 08/06/2015  . Prostate cancer ([place])   . Testicular cyst    Dr. Jacqlyn Larsen      Objective/Physical Exam Neurovascular status intact.  Dorsal medial foot incision appears well coapted.  Complete reepithelialization of the incision site has occurred.  There is some dehiscence of the lateral portion of the right foot incision site approximately 3.5 cm along the incision.  There is some fibrotic tissue noted to the wound base. Focal area of dehiscence with drainage and malodor noted along lateral incision. No active bleeding noted. Moderate edema noted to the surgical extremity.  Periwound integrity is intact and there is no sign of infection.  Assessment: 1. s/p excision of Ganglion cysts x 2 right foot. DOS: 11/29/17   Plan of Care:  1. Patient was evaluated.  2.  Medically necessary excisional debridement including subcutaneous tissue and fibrotic tissue was performed using a tissue nipper.  Excisional debridement of all the necrotic nonviable tissue down to healthy bleeding viable tissue was performed with post remeasurement same as pre- 3.  Recommend applying Iodosorb and a Band-Aid daily. 4.  Continue wearing good supportive shoes 5.  Return to clinic in 2 weeks  Edrick Kins, DPM [facility]  Dr. Edrick Kins, [address] [place]                                        [place], [place] [address]                Office [phone]  Fax [phone]

## 2018-01-15 DIAGNOSIS — H2513 Age-related nuclear cataract, bilateral: Secondary | ICD-10-CM | POA: Diagnosis not present

## 2018-01-22 ENCOUNTER — Ambulatory Visit (INDEPENDENT_AMBULATORY_CARE_PROVIDER_SITE_OTHER): Payer: PPO

## 2018-01-22 ENCOUNTER — Ambulatory Visit (INDEPENDENT_AMBULATORY_CARE_PROVIDER_SITE_OTHER): Payer: PPO | Admitting: Podiatry

## 2018-01-22 DIAGNOSIS — Z9889 Other specified postprocedural states: Secondary | ICD-10-CM

## 2018-01-22 DIAGNOSIS — M898X7 Other specified disorders of bone, ankle and foot: Secondary | ICD-10-CM

## 2018-01-27 NOTE — Progress Notes (Signed)
   Subjective:  Patient presents today status post excision of Ganglion cysts x 2 of the right foot. DOS: 11/29/17. He reports he is doing much better since his previous visit. He has been using gentamicin cream daily to the incision site as directed. There are no modifying factors noted. Patient is here for further evaluation and treatment.    Past Medical History:  Diagnosis Date  . Anxiety   . Arthritis   . Chicken pox   . Dementia ([place])   . Depression   . Gout   . Personal history of tobacco use, presenting hazards to health 08/06/2015  . Prostate cancer ([place])   . Testicular cyst    Dr. Jacqlyn Larsen      Objective/Physical Exam Neurovascular status intact.  Dorsal medial foot incision appears well coapted.  Complete reepithelialization of the incision site has occurred.  There is some dehiscence of the lateral portion of the right foot incision site that has improved to approximately 2.0 cm along the incision.  There is some fibrotic tissue noted to the wound base. Focal area of dehiscence with drainage and malodor noted along lateral incision. No active bleeding noted. Moderate edema noted to the surgical extremity.  Periwound integrity is intact and there is no sign of infection.  Radiographic Exam:  Normal osseous mineralization. Joint spaces preserved. No fracture/dislocation/boney destruction.    Assessment: 1. s/p excision of Ganglion cysts x 2 right foot. DOS: 11/29/17   Plan of Care:  1. Patient was evaluated. X-Rays reviewed.  2. Light debridement of wound with tissue nipper.  3. Continue using gentamicin cream daily with a bandage to lateral incision site.  4. Return to clinic in 3 weeks.   Edrick Kins, DPM [facility]  Dr. Edrick Kins, [place]                                        [place], [place] [address]                Office [phone]  Fax [phone]

## 2018-02-06 ENCOUNTER — Telehealth: Payer: Self-pay | Admitting: Podiatry

## 2018-02-06 NOTE — Telephone Encounter (Signed)
I returned call to patient, he stated that his foot started hurting and draining at incision site 3-4 days ago and now can't put much pressure on his foot.  He denied any fever chills,  He informed me that he is currently taking antibiotic.  I instructed him to continue with antibiotic and was sent to scheduling for appt tomorrow.

## 2018-02-06 NOTE — Telephone Encounter (Signed)
Patient wife said his incision is oozing and really painful. Please call patient

## 2018-02-06 NOTE — Telephone Encounter (Signed)
Patient Wife said his incision is oozing, and really painful.

## 2018-02-07 ENCOUNTER — Encounter: Payer: Self-pay | Admitting: Podiatry

## 2018-02-07 ENCOUNTER — Ambulatory Visit (INDEPENDENT_AMBULATORY_CARE_PROVIDER_SITE_OTHER): Payer: PPO | Admitting: Podiatry

## 2018-02-07 DIAGNOSIS — Z9889 Other specified postprocedural states: Secondary | ICD-10-CM

## 2018-02-07 DIAGNOSIS — M109 Gout, unspecified: Secondary | ICD-10-CM

## 2018-02-07 NOTE — Progress Notes (Signed)
This patient presents to the office with chief complaint of a painful right foot.  Patient presents to the office today with his wife for an evaluation of his right foot.  He presents to the office patient presents nonweightbearing using crutches due to the severe pain he experiences when weightbearing.  I had difficulty obtaining a true history of this patient's right foot history.  He says that back in 2017 Dr. Caryl Comes remove the cyst from the top of his right foot and called it a ganglion.  Marland Kitchen He states he also received significant medication from Dr. Caryl Comes which he states was for gout.   He also states that his ganglion cyst was aspirated by Dr. Elvina Mattes.  Patient was then seen in this office by Dr. Amalia Hailey who diagnosed him as having a ganglion on on his right foot.  He was scheduled by Dr. Amalia Hailey for removal of the ganglion cyst right foot.  I have no notes concerning the procedures performed and the findings related to the surgery.  The wife then interjects and says that his right foot was filled with white chalky material during the surgery.  During his postoperative visits he was diagnosed as having a cellulitis and has been treated with both doxycycline and Septra DS.  He presently is still taking his antibiotics that were prescribed.  He now returns to the office stating that he has severe pain and discomfort at the incision sites of his right foot.  His wife states that he has developed a new cyst on the top of the right foot medial to the incision on the top of the right foot.  There is dramatic redness swelling and palpable pain to the right foot today.   He does admit that he has taken 1 gout medication daily since Sunday which appears to lessen his pain.  Then pain returns.  He resents the office today for reevaluation of the surgical site right foot. He does admit having a temperature over 100 this weekend..  Vascular  Dorsalis pedis and posterior tibial pulses are palpable  B/L.  Capillary return  WNL.   Temperature gradient is  WNL.  Skin turgor  WNL  Sensorium  Senn Weinstein monofilament wire  WNL. Normal tactile sensation.  Nail Exam  Patient has normal nails with no evidence of bacterial or fungal infection.  Orthopedic  Exam  Muscle tone and muscle strength  WNL.  No limitations of motion feet  B/L.  No crepitus or joint effusion noted.  Foot type is unremarkable and digits show no abnormalities.  Bony prominences are unremarkable.  Skin  No open lesions.  Normal skin texture and turgor.  The inside dorsal incision on the right foot appears to have healed well.  There is no drainage noted from the incision site.  The incision site on the outside top of his right foot reveals healing except in the center of the incision where there is a white chalky material noted of approximately 1 mm x 1 mm.  There is a severe inflammatory process extending from the base of the metatarsal shafts right foot approximately toward the anterior aspect of the right ankle.  No evidence of any streaking or lymphangitis noted.    Acute inflammation noted right foot at surgery site.  Probable gout.    ROV.  Xrays were reviewed and no bony pathology noted.  Patient has severe redness and swelling noted at the surgical site.  This does not appear to be bacterial in nature since  there is no streaking and he has been on antibiotics orally.  There does appear to be urate crystals noted in the center of the incision on the outside of the right foot.  I had planned on sending to pathology but the wife had stated his foot has a history of gout.  I then told this husband and wife to take 1 colchicine every hour until GI sickness is noted.  I also told him to continue on his antibiotics which were previously dispensed.  I told him to return to the office tomorrow for an evaluation by Dr. Amalia Hailey.  I believe he is having a gouty flareup following his surgery.  I would consider a Medrol Dosepak to be taken orally but also blood work to  be obtained  to determine the urate  level in his blood. Gardiner Barefoot DPM

## 2018-02-08 ENCOUNTER — Encounter: Payer: Self-pay | Admitting: Podiatry

## 2018-02-08 ENCOUNTER — Ambulatory Visit (INDEPENDENT_AMBULATORY_CARE_PROVIDER_SITE_OTHER): Payer: PPO | Admitting: Podiatry

## 2018-02-08 DIAGNOSIS — M109 Gout, unspecified: Secondary | ICD-10-CM | POA: Diagnosis not present

## 2018-02-08 MED ORDER — SULFAMETHOXAZOLE-TRIMETHOPRIM 800-160 MG PO TABS: 1.0000 | ORAL_TABLET | Freq: Two times a day (BID) | ORAL | 0 refills | Status: DC

## 2018-02-08 MED ORDER — ALLOPURINOL 100 MG PO TABS: 100.0000 mg | ORAL_TABLET | Freq: Every day | ORAL | 1 refills | Status: DC

## 2018-02-08 MED ORDER — METHYLPREDNISOLONE 4 MG PO TBPK: ORAL_TABLET | ORAL | 0 refills | Status: DC

## 2018-02-11 LAB — WOUND CULTURE: ORGANISM ID, BACTERIA: NONE SEEN

## 2018-02-12 ENCOUNTER — Encounter: Payer: PPO | Admitting: Podiatry

## 2018-02-15 ENCOUNTER — Encounter: Payer: Self-pay | Admitting: Podiatry

## 2018-02-15 ENCOUNTER — Ambulatory Visit (INDEPENDENT_AMBULATORY_CARE_PROVIDER_SITE_OTHER): Payer: PPO

## 2018-02-15 ENCOUNTER — Ambulatory Visit (INDEPENDENT_AMBULATORY_CARE_PROVIDER_SITE_OTHER): Payer: PPO | Admitting: Podiatry

## 2018-02-15 DIAGNOSIS — M10071 Idiopathic gout, right ankle and foot: Secondary | ICD-10-CM | POA: Diagnosis not present

## 2018-02-15 DIAGNOSIS — M67471 Ganglion, right ankle and foot: Secondary | ICD-10-CM

## 2018-02-15 NOTE — Progress Notes (Signed)
   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  Date of surgery 11/29/2017.  Patient had an episode of acute flareup of the surgical foot with redness and swelling and a large cystic lesion that developed on the dorsal aspect of the right foot.  He presented on 02/07/2018 at the office and was seen by Dr. Prudence Davidson.  Patient advised to continue oral colchicine and follow-up here in the office.  He presents today for follow-up treatment evaluation.  He is still having a significant amount of pain with tenderness and erythema and edema to the surgical foot.  Patient states pain is 10/10.  Past Medical History:  Diagnosis Date  . Anxiety   . Arthritis   . Chicken pox   . Dementia ([place])   . Depression   . Gout   . Personal history of tobacco use, presenting hazards to health 08/06/2015  . Prostate cancer ([place])   . Testicular cyst    Dr. Jacqlyn Larsen     Physical Exam: General: The patient is alert and oriented x3 in no acute distress.  Dermatology: Surgical extremity is warm to touch.  Surgical excision incision sites appear to be somewhat healed.  Vascular: Palpable pedal pulses bilaterally.  Erythema and edema noted to the surgical foot. Capillary refill within normal limits.  Neurological: Epicritic and protective threshold grossly intact bilaterally.   Musculoskeletal Exam: Range of motion within normal limits to all pedal and ankle joints bilateral. Muscle strength 5/5 in all groups bilateral.  Increased pain on palpation and with light touch throughout the right foot.  Fluctuant mass noted to the dorsum of the right foot consistent with the cystic lesion.  Assessment: 1.  Acute gout flareup vs cellulitis right foot   Plan of Care:  1. Patient evaluated. X-Rays reviewed that were taken on last visit.  2.  Decision was made to aspirate the fluctuant mass to the dorsum of the foot.  18-gauge needle utilized to aspirate the lesion and  approximately 5 mL cloudy fluid aspirated from the dorsal lesion.  3 mL of local anesthesia infiltration was utilized prior to procedure and the foot was prepped in aseptic manner. 3.  Aspirate was sent to pathology for gross and microscopic exam as well as sent for culture and Gram stain. 4.  Prescription for Medrol Dosepak 5.  Prescription for Bactrim DS 6.  Patient has never been on allopurinol prior to today's visit.  Prescription for allopurinol 100 mg daily 7.  Return to clinic in 10 days      Edrick Kins, DPM [facility]  Dr. Edrick Kins, DPM    [address], [place] [address]                Office [phone]  Fax [phone]

## 2018-02-16 LAB — URIC ACID: Uric Acid: 5.6 mg/dL (ref 3.7–8.6)

## 2018-02-18 LAB — SYNOVIAL FLUID PANEL
Glucose, Fluid: 21 mg/dL
PROTEIN FL: 4.5 g/dL

## 2018-02-19 ENCOUNTER — Encounter: Payer: PPO | Admitting: Podiatry

## 2018-02-19 NOTE — Progress Notes (Signed)
   HPI: Patient presents today status post excision of gouty tophi x2 to the right lower extremity.  Patient states that it is still painful in the mornings.  Sit significantly painful after walking.  Patient had another acute gouty attack with a significant amount of pain and inflammation.  He was seen by Dr. Prudence Davidson here in the office on 02/07/2018.  He followed up with me on 02/08/2018 at which time antibiotics and a Medrol Dosepak was prescribed.  The patient has never been on allopurinol prior to the visit as well and a prescription for allopurinol 100 mg daily was prescribed.  She was also sent for uric acid level test.  Cloudy fluid was aspirated from the acute gout attack and consistent with monosodium urate crystals.  He presents for follow-up treatment evaluation  Past Medical History:  Diagnosis Date  . Anxiety   . Arthritis   . Chicken pox   . Dementia ([place])   . Depression   . Gout   . Personal history of tobacco use, presenting hazards to health 08/06/2015  . Prostate cancer ([place])   . Testicular cyst    Dr. Jacqlyn Larsen     Physical Exam: General: The patient is alert and oriented x3 in no acute distress.  Dermatology: Skin is warm, dry and supple bilateral lower extremities. Negative for open lesions or macerations.  Vascular: Palpable pedal pulses bilaterally.  There continues to be significant erythema and edema noted to the right foot.  Capillary refill within normal limits.  Neurological: Epicritic and protective threshold grossly intact bilaterally.   Musculoskeletal Exam: Range of motion within normal limits to all pedal and ankle joints bilateral. Muscle strength 5/5 in all groups bilateral.  Significant amount of pain on palpation throughout the right midfoot consistent with acute gout attack  Radiographic Exam:  Normal osseous mineralization. Joint spaces preserved. No fracture/dislocation/boney destruction.    Assessment: 1.  Acute gout attack right mid foot   Plan of  Care:  1. Patient evaluated. X-Rays reviewed.  2.  Today we are going to see if the patient can be approved for Crystexxa for treatment of gouty tophi and recurrent gout 3.  Orders were also placed for uric acid level test for baseline 4.  Continue allopurinol 100 mg daily 5.  Patient may benefit from a referral to rheumatology.  Orders placed today for rheumatology referral for management of chronic gout arthropathy 6.  We will have the patient call 9 if he needs a refill prescription for pain as needed 7.  Once [facility] is approved we will have the patient come in we will pursue management and treatment of the gout with Donnal Debar, DPM [facility]  Dr. Edrick Kins, DPM    [address], [place] [address]                Office [phone]  Fax [phone]

## 2018-02-20 ENCOUNTER — Telehealth: Payer: Self-pay | Admitting: Podiatry

## 2018-02-20 NOTE — Telephone Encounter (Signed)
Pt was being treated for gout and was to be referred elsewhere by Dr. Amalia Hailey. When the patient has attempted to schedule the appt. with the practice that are stating that they need a referral from Dr. Amalia Hailey. Please give patient a call.

## 2018-02-21 NOTE — Telephone Encounter (Signed)
Patient contacted and notified that we are waiting on insurance benefits, then Jasper Riling, RN with [facility] will be calling him to go over information.

## 2018-02-26 ENCOUNTER — Other Ambulatory Visit: Payer: Self-pay

## 2018-02-26 DIAGNOSIS — M109 Gout, unspecified: Secondary | ICD-10-CM

## 2018-02-27 ENCOUNTER — Telehealth: Payer: Self-pay

## 2018-02-27 NOTE — Telephone Encounter (Signed)
-----   Message from Edrick Kins, DPM sent at 02/15/2018 10:56 AM EST ----- Regarding: Crystexxa Please order Crystexxa.   Thanks, Dr. Amalia Hailey

## 2018-02-27 NOTE — Telephone Encounter (Signed)
Krystexxa referral has been sent and referral to [facility] done.  Appt scheduled with [facility]  Leafy Kindle, PA-c on 03/06/18 # 8:30am.

## 2018-03-06 DIAGNOSIS — M1990 Unspecified osteoarthritis, unspecified site: Secondary | ICD-10-CM | POA: Diagnosis not present

## 2018-03-06 DIAGNOSIS — E663 Overweight: Secondary | ICD-10-CM | POA: Diagnosis not present

## 2018-03-06 DIAGNOSIS — Z6825 Body mass index (BMI) 25.0-25.9, adult: Secondary | ICD-10-CM | POA: Diagnosis not present

## 2018-03-06 DIAGNOSIS — M1A09X1 Idiopathic chronic gout, multiple sites, with tophus (tophi): Secondary | ICD-10-CM | POA: Diagnosis not present

## 2018-03-06 DIAGNOSIS — M2550 Pain in unspecified joint: Secondary | ICD-10-CM | POA: Diagnosis not present

## 2018-04-17 DIAGNOSIS — M1A09X1 Idiopathic chronic gout, multiple sites, with tophus (tophi): Secondary | ICD-10-CM | POA: Diagnosis not present

## 2018-04-17 DIAGNOSIS — M1990 Unspecified osteoarthritis, unspecified site: Secondary | ICD-10-CM | POA: Diagnosis not present

## 2018-04-17 DIAGNOSIS — R208 Other disturbances of skin sensation: Secondary | ICD-10-CM | POA: Diagnosis not present

## 2018-04-17 DIAGNOSIS — Z6824 Body mass index (BMI) 24.0-24.9, adult: Secondary | ICD-10-CM | POA: Diagnosis not present

## 2018-04-17 DIAGNOSIS — M2550 Pain in unspecified joint: Secondary | ICD-10-CM | POA: Diagnosis not present

## 2018-04-27 DIAGNOSIS — H6593 Unspecified nonsuppurative otitis media, bilateral: Secondary | ICD-10-CM | POA: Diagnosis not present

## 2018-04-27 DIAGNOSIS — H7293 Unspecified perforation of tympanic membrane, bilateral: Secondary | ICD-10-CM | POA: Diagnosis not present

## 2018-05-08 ENCOUNTER — Other Ambulatory Visit: Payer: Self-pay | Admitting: Physician Assistant

## 2018-05-08 DIAGNOSIS — N63 Unspecified lump in unspecified breast: Secondary | ICD-10-CM

## 2018-05-08 DIAGNOSIS — H6593 Unspecified nonsuppurative otitis media, bilateral: Secondary | ICD-10-CM | POA: Diagnosis not present

## 2018-05-08 DIAGNOSIS — I10 Essential (primary) hypertension: Secondary | ICD-10-CM | POA: Diagnosis not present

## 2018-05-08 DIAGNOSIS — M1A00X0 Idiopathic chronic gout, unspecified site, without tophus (tophi): Secondary | ICD-10-CM | POA: Diagnosis not present

## 2018-05-08 DIAGNOSIS — H7293 Unspecified perforation of tympanic membrane, bilateral: Secondary | ICD-10-CM | POA: Diagnosis not present

## 2018-05-08 DIAGNOSIS — E781 Pure hyperglyceridemia: Secondary | ICD-10-CM | POA: Diagnosis not present

## 2018-05-08 DIAGNOSIS — H9193 Unspecified hearing loss, bilateral: Secondary | ICD-10-CM | POA: Diagnosis not present

## 2018-05-09 ENCOUNTER — Other Ambulatory Visit: Payer: Self-pay | Admitting: Physician Assistant

## 2018-05-09 DIAGNOSIS — N63 Unspecified lump in unspecified breast: Secondary | ICD-10-CM

## 2018-05-15 DIAGNOSIS — M1990 Unspecified osteoarthritis, unspecified site: Secondary | ICD-10-CM | POA: Diagnosis not present

## 2018-05-15 DIAGNOSIS — R208 Other disturbances of skin sensation: Secondary | ICD-10-CM | POA: Diagnosis not present

## 2018-05-15 DIAGNOSIS — M1A09X1 Idiopathic chronic gout, multiple sites, with tophus (tophi): Secondary | ICD-10-CM | POA: Diagnosis not present

## 2018-05-15 DIAGNOSIS — Z6825 Body mass index (BMI) 25.0-25.9, adult: Secondary | ICD-10-CM | POA: Diagnosis not present

## 2018-05-15 DIAGNOSIS — E663 Overweight: Secondary | ICD-10-CM | POA: Diagnosis not present

## 2018-05-15 DIAGNOSIS — M2550 Pain in unspecified joint: Secondary | ICD-10-CM | POA: Diagnosis not present

## 2018-05-16 ENCOUNTER — Ambulatory Visit
Admission: RE | Admit: 2018-05-16 | Discharge: 2018-05-16 | Disposition: A | Discharge status: Home / Self Care | Payer: PPO | Source: Ambulatory Visit | Attending: Physician Assistant | Admitting: Physician Assistant

## 2018-05-16 DIAGNOSIS — N63 Unspecified lump in unspecified breast: Secondary | ICD-10-CM

## 2018-05-16 DIAGNOSIS — N62 Hypertrophy of breast: Secondary | ICD-10-CM | POA: Diagnosis not present

## 2018-05-16 DIAGNOSIS — R928 Other abnormal and inconclusive findings on diagnostic imaging of breast: Secondary | ICD-10-CM | POA: Diagnosis not present

## 2018-05-22 ENCOUNTER — Telehealth: Payer: Self-pay | Admitting: *Deleted

## 2018-05-22 NOTE — Telephone Encounter (Signed)
Left message for patient to notify them that it is time to schedule annual low dose lung cancer screening CT scan. Instructed patient to call back to verify information prior to the scan being scheduled.  

## 2018-05-23 DIAGNOSIS — H9209 Otalgia, unspecified ear: Secondary | ICD-10-CM | POA: Diagnosis not present

## 2018-05-23 DIAGNOSIS — H903 Sensorineural hearing loss, bilateral: Secondary | ICD-10-CM | POA: Diagnosis not present

## 2018-05-28 ENCOUNTER — Telehealth: Payer: Self-pay | Admitting: *Deleted

## 2018-05-28 DIAGNOSIS — Z87891 Personal history of nicotine dependence: Secondary | ICD-10-CM

## 2018-05-28 DIAGNOSIS — Z122 Encounter for screening for malignant neoplasm of respiratory organs: Secondary | ICD-10-CM

## 2018-05-28 NOTE — Telephone Encounter (Signed)
Patient has been notified that annual lung cancer screening low dose CT scan is due currently or will be in near future. Confirmed that patient is within the age range of 55-77, and asymptomatic, (no signs or symptoms of lung cancer). Patient denies illness that would prevent curative treatment for lung cancer if found. Verified smoking history, (current, 61 pack year). The shared decision making visit was done 06/05/14. Patient is agreeable for CT scan being scheduled.

## 2018-05-29 ENCOUNTER — Encounter: Payer: Self-pay | Admitting: *Deleted

## 2018-05-30 ENCOUNTER — Ambulatory Visit
Admission: RE | Admit: 2018-05-30 | Discharge: 2018-05-30 | Disposition: A | Discharge status: Home / Self Care | Payer: PPO | Source: Ambulatory Visit | Attending: Oncology | Admitting: Oncology

## 2018-05-30 DIAGNOSIS — Z87891 Personal history of nicotine dependence: Secondary | ICD-10-CM | POA: Insufficient documentation

## 2018-05-30 DIAGNOSIS — Z122 Encounter for screening for malignant neoplasm of respiratory organs: Secondary | ICD-10-CM | POA: Insufficient documentation

## 2018-05-30 DIAGNOSIS — F17210 Nicotine dependence, cigarettes, uncomplicated: Secondary | ICD-10-CM | POA: Diagnosis not present

## 2018-05-31 ENCOUNTER — Telehealth: Payer: Self-pay | Admitting: *Deleted

## 2018-05-31 ENCOUNTER — Encounter: Payer: Self-pay | Admitting: *Deleted

## 2018-05-31 DIAGNOSIS — M19011 Primary osteoarthritis, right shoulder: Secondary | ICD-10-CM | POA: Diagnosis not present

## 2018-05-31 NOTE — Telephone Encounter (Signed)
Notified patient of LDCT lung cancer screening program results with recommendation for 12 month follow up imaging.  Also notified of incidental findings noted below and is encouraged to discuss further questions with PCP who will receive a copy of this not and/or the CT reports.  Patient verbalized understanding.     IMPRESSION: 1. Lung-RADS Category 2, benign appearance or behavior. Continue annual screening with low-dose chest CT without contrast in 12 months. 2. Coronary artery atherosclerosis. 3. New soft tissue fullness at the right breast. This was evaluated with mammogram on 05/16/2018. Please see that report.  Aortic Atherosclerosis (ICD10-I70.0).

## 2018-06-10 DIAGNOSIS — M5416 Radiculopathy, lumbar region: Secondary | ICD-10-CM | POA: Diagnosis not present

## 2018-06-10 DIAGNOSIS — M5136 Other intervertebral disc degeneration, lumbar region: Secondary | ICD-10-CM | POA: Diagnosis not present

## 2018-06-18 DIAGNOSIS — M1A09X1 Idiopathic chronic gout, multiple sites, with tophus (tophi): Secondary | ICD-10-CM | POA: Diagnosis not present

## 2018-06-20 DIAGNOSIS — M19012 Primary osteoarthritis, left shoulder: Secondary | ICD-10-CM | POA: Diagnosis not present

## 2018-07-08 DIAGNOSIS — M5416 Radiculopathy, lumbar region: Secondary | ICD-10-CM | POA: Diagnosis not present

## 2018-07-08 DIAGNOSIS — N183 Chronic kidney disease, stage 3 unspecified: Secondary | ICD-10-CM | POA: Diagnosis not present

## 2018-07-08 DIAGNOSIS — M5136 Other intervertebral disc degeneration, lumbar region: Secondary | ICD-10-CM | POA: Diagnosis not present

## 2018-07-08 DIAGNOSIS — H524 Presbyopia: Secondary | ICD-10-CM | POA: Diagnosis not present

## 2018-08-21 DIAGNOSIS — R208 Other disturbances of skin sensation: Secondary | ICD-10-CM | POA: Diagnosis not present

## 2018-08-21 DIAGNOSIS — M2550 Pain in unspecified joint: Secondary | ICD-10-CM | POA: Diagnosis not present

## 2018-08-21 DIAGNOSIS — M1A09X1 Idiopathic chronic gout, multiple sites, with tophus (tophi): Secondary | ICD-10-CM | POA: Diagnosis not present

## 2018-09-10 DIAGNOSIS — M5136 Other intervertebral disc degeneration, lumbar region: Secondary | ICD-10-CM | POA: Diagnosis not present

## 2018-09-10 DIAGNOSIS — M5416 Radiculopathy, lumbar region: Secondary | ICD-10-CM | POA: Diagnosis not present

## 2018-09-13 ENCOUNTER — Emergency Department
Admission: EM | Admit: 2018-09-13 | Discharge: 2018-09-13 | Disposition: A | Discharge status: Home / Self Care | Payer: PPO | Attending: Emergency Medicine | Admitting: Emergency Medicine

## 2018-09-13 ENCOUNTER — Other Ambulatory Visit: Payer: Self-pay

## 2018-09-13 ENCOUNTER — Emergency Department: Payer: PPO

## 2018-09-13 ENCOUNTER — Encounter: Payer: Self-pay | Admitting: Emergency Medicine

## 2018-09-13 DIAGNOSIS — S61214A Laceration without foreign body of right ring finger without damage to nail, initial encounter: Secondary | ICD-10-CM | POA: Diagnosis not present

## 2018-09-13 DIAGNOSIS — Y999 Unspecified external cause status: Secondary | ICD-10-CM | POA: Diagnosis not present

## 2018-09-13 DIAGNOSIS — Z79899 Other long term (current) drug therapy: Secondary | ICD-10-CM | POA: Insufficient documentation

## 2018-09-13 DIAGNOSIS — Y929 Unspecified place or not applicable: Secondary | ICD-10-CM | POA: Insufficient documentation

## 2018-09-13 DIAGNOSIS — Z8546 Personal history of malignant neoplasm of prostate: Secondary | ICD-10-CM | POA: Diagnosis not present

## 2018-09-13 DIAGNOSIS — F17210 Nicotine dependence, cigarettes, uncomplicated: Secondary | ICD-10-CM | POA: Diagnosis not present

## 2018-09-13 DIAGNOSIS — S61210A Laceration without foreign body of right index finger without damage to nail, initial encounter: Secondary | ICD-10-CM

## 2018-09-13 DIAGNOSIS — S61212A Laceration without foreign body of right middle finger without damage to nail, initial encounter: Secondary | ICD-10-CM

## 2018-09-13 DIAGNOSIS — F0390 Unspecified dementia without behavioral disturbance: Secondary | ICD-10-CM | POA: Diagnosis not present

## 2018-09-13 DIAGNOSIS — I2510 Atherosclerotic heart disease of native coronary artery without angina pectoris: Secondary | ICD-10-CM | POA: Insufficient documentation

## 2018-09-13 DIAGNOSIS — W312XXA Contact with powered woodworking and forming machines, initial encounter: Secondary | ICD-10-CM | POA: Insufficient documentation

## 2018-09-13 DIAGNOSIS — Y939 Activity, unspecified: Secondary | ICD-10-CM | POA: Insufficient documentation

## 2018-09-13 DIAGNOSIS — S61218A Laceration without foreign body of other finger without damage to nail, initial encounter: Secondary | ICD-10-CM

## 2018-09-13 MED ORDER — SULFAMETHOXAZOLE-TRIMETHOPRIM 800-160 MG PO TABS
1.0000 | ORAL_TABLET | Freq: Once | ORAL | Status: AC
  Administered 2018-09-13: 1 via ORAL
  Filled 2018-09-13: qty 1

## 2018-09-13 MED ORDER — TRAMADOL HCL 50 MG PO TABS: 50.0000 mg | ORAL_TABLET | Freq: Two times a day (BID) | ORAL | 0 refills | Status: DC | PRN

## 2018-09-13 MED ORDER — SULFAMETHOXAZOLE-TRIMETHOPRIM 800-160 MG PO TABS: 1.0000 | ORAL_TABLET | Freq: Two times a day (BID) | ORAL | 0 refills | Status: DC

## 2018-09-13 MED ORDER — BACITRACIN-NEOMYCIN-POLYMYXIN 400-5-5000 EX OINT
TOPICAL_OINTMENT | Freq: Once | CUTANEOUS | Status: AC
  Administered 2018-09-13: 1 via TOPICAL
  Filled 2018-09-13: qty 1

## 2018-09-13 MED ORDER — LIDOCAINE HCL (PF) 1 % IJ SOLN
INTRAMUSCULAR | Status: AC
  Filled 2018-09-13: qty 10

## 2018-09-13 MED ORDER — TRAMADOL HCL 50 MG PO TABS
50.0000 mg | ORAL_TABLET | Freq: Once | ORAL | Status: AC
  Administered 2018-09-13: 50 mg via ORAL
  Filled 2018-09-13: qty 1

## 2018-09-13 MED ORDER — LIDOCAINE HCL (PF) 1 % IJ SOLN: 10.0000 mL | Freq: Once | INTRAMUSCULAR | Status: DC

## 2018-09-13 NOTE — Discharge Instructions (Signed)
Follow discharge care instruction take antibiotics as directed.  May take pain medication as needed.

## 2018-09-13 NOTE — ED Provider Notes (Signed)
[facility] Emergency Department Provider Note   ____________________________________________   First MD Initiated Contact with Patient 09/13/18 1126     (approximate)  I have reviewed the triage vital signs and the nursing notes.   HISTORY  Chief Complaint Extremity Laceration    HPI Ian Ross is a 73 y.o. male patient presents with laceration to the second through fourth digit right hand.  Patient fingers were cut with saw.  Hemorrhaging controlled direct pressure.  Patient denies loss of function or sensation.  Patient states tetanus shot is up-to-date.  Patient rates pain as 4/10.  Patient described pain as "sore".  No other palliative measure for complaint.         Past Medical History:  Diagnosis Date  . Anxiety   . Arthritis   . Chicken pox   . Dementia ([place])   . Depression   . Gout   . Personal history of tobacco use, presenting hazards to health 08/06/2015  . Prostate cancer ([place])   . Testicular cyst    Dr. Jacqlyn Larsen    Patient Active Problem List   Diagnosis Date Noted  . Solitary pulmonary nodule 09/08/2015  . Atherosclerosis 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 09/08/2015  . Personal history of tobacco use, presenting hazards to health 08/06/2015  . Routine general medical examination at a health care facility 05/05/2015  . Ganglion cyst 04/26/2015  . Malignant neoplasm of prostate ([place]) 12/24/2014  . Insomnia 12/24/2014  . Tobacco abuse 05/20/2014  . Medicare annual wellness visit, subsequent 04/18/2012  . B12 deficiency 01/03/2012  . Depression 12/06/2011  . Dementia ([place]) 12/06/2011  . Neuropathy ([place]) 12/06/2011    Past Surgical History:  Procedure Laterality Date  . GANGLION CYST EXCISION Right 10/08/2015   Procedure: REMOVAL GANGLION CYST FOOT/2 right foot;  Surgeon: Sharlotte Alamo, DPM;  Location: [facility] ORS;  Service: Podiatry;  Laterality: Right;  . KNEE ARTHROSCOPY Right    Dr. Tamala Julian,  [facility]  . NASAL SINUS SURGERY     Dr. Nadeen Landau, [facility]    Prior to Admission medications   Medication Sig Start Date End Date Taking? Authorizing Provider  allopurinol (ZYLOPRIM) 100 MG tablet Take 1 tablet (100 mg total) by mouth daily. 02/08/18   Edrick Kins, DPM  ARIPiprazole (ABILIFY PO) Take by mouth.    [provider]  aspirin 81 MG tablet Take 81 mg by mouth daily.    [provider]  colchicine 0.6 MG tablet Take 0.6 mg by mouth daily as needed.  12/12/13   [provider]  cyanocobalamin (,VITAMIN B-12,) 1000 MCG/ML injection Inject 1 mL (1,000 mcg total) into the muscle every 30 (thirty) days. 09/08/15   Jackolyn Confer, MD  donepezil (ARICEPT) 5 MG tablet Take 1 tablet (5 mg total) by mouth daily. 05/05/15   Jackolyn Confer, MD  gabapentin (NEURONTIN) 100 MG capsule 2 po qHS 11/20/16   [provider]  sertraline (ZOLOFT) 100 MG tablet TAKE 1 TABLET(100 MG) BY MOUTH DAILY 12/25/16   Thersa Salt G, DO  sulfamethoxazole-trimethoprim (BACTRIM DS) 800-160 MG tablet Take 1 tablet by mouth 2 (two) times daily. 09/13/18   Sable Feil, PA-C  traMADol (ULTRAM) 50 MG tablet Take 1 tablet (50 mg total) by mouth every 12 (twelve) hours as needed. 09/13/18   Sable Feil, PA-C  zolpidem (AMBIEN) 10 MG tablet TK 1 T PO NIGHTLY PRF SLEEP 09/29/16   [provider]  Allergies Codeine  Family History  Problem Relation Age of Onset  . Cancer Mother        breast  . Heart disease Mother   . Cancer Father        prostate  . Heart disease Father   . Breast cancer Neg Hx     Social History Social History   Tobacco Use  . Smoking status: Current Every Day Smoker    Packs/day: 0.25    Types: Cigarettes  . Smokeless tobacco: Current User    Types: Chew  . Tobacco comment: chews tobacco  Substance Use Topics  . Alcohol use: Yes    Alcohol/week: 0.0 standard drinks    Comment: occassionally  . Drug use: No    Review of Systems  Constitutional: No fever/chills Eyes: No visual changes. ENT: No sore throat. Cardiovascular: Denies chest pain. Respiratory: Denies shortness of breath. Gastrointestinal: No abdominal pain.  No nausea, no vomiting.  No diarrhea.  No constipation. Genitourinary: Negative for dysuria. Musculoskeletal: Negative for back pain. Skin: Negative for rash.  Finger laceration Neurological: Negative for headaches, focal weakness or numbness. Psychiatric:  Anxiety and depression  Allergic/Immunilogical: Codeine. ____________________________________________   PHYSICAL EXAM:  VITAL SIGNS: ED Triage Vitals  Enc Vitals Group     BP 09/13/18 1131 (!) 145/88     Pulse Rate 09/13/18 1131 88     Resp 09/13/18 1131 18     Temp 09/13/18 1131 98 F (36.7 C)     Temp Source 09/13/18 1131 Oral     SpO2 09/13/18 1131 99 %     Weight 09/13/18 1118 210 lb (95.3 kg)     Height 09/13/18 1118 6\' 2"  (1.88 m)     Head Circumference --      Peak Flow --      Pain Score 09/13/18 1117 4     Pain Loc --      Pain Edu? --      Excl. in [place]? --    Constitutional: Alert and oriented. Well appearing and in no acute distress. Cardiovascular: Normal rate, regular rhythm. Grossly normal heart sounds.  Good peripheral circulation. Respiratory: Normal respiratory effort.  No retractions. Lungs CTAB. Neurologic:  Normal speech and language. No gross focal neurologic deficits are appreciated. No gait instability. Skin: Laceration to the palmar aspect of the second through fourth digit. Psychiatric: Mood and affect are normal. Speech and behavior are normal.  ____________________________________________   LABS (all labs ordered are listed, but only abnormal results are displayed)  Labs Reviewed - No data to display ____________________________________________  EKG   ____________________________________________  RADIOLOGY  ED MD interpretation:    Official radiology report(s): Dg Hand Complete Right   Result Date: 09/13/2018 CLINICAL DATA:  Skill saw injury. EXAM: RIGHT HAND - COMPLETE 3+ VIEW COMPARISON:  None. FINDINGS: Lacerations are present in the index and long fingers. There are cortical fractures with bone fragments in both digits. This involves the radial aspect of the head of the middle phalanx in the index finger. The cortical disruption is more proximal in the middle phalanx of the long finger, also along the radial aspect. No radiopaque foreign body is present. No other focal injuries are evident. IMPRESSION: 1. Deep lacerations and cortical fractures involving the index and middle fingers as described. Electronically Signed   By: San Morelle M.D.   On: 09/13/2018 11:47    ____________________________________________   PROCEDURES  Procedure(s) performed (including Critical Care):  Marland KitchenMarland KitchenLaceration Repair  Date/Time: 09/13/2018 1:02 PM  Performed by: Sable Feil, PA-C Authorized by: Sable Feil, PA-C   Consent:    Consent obtained:  Verbal   Consent given by:  Patient   Risks discussed:  Infection, pain and poor cosmetic result Anesthesia (see MAR for exact dosages):    Anesthesia method:  Local infiltration and nerve block   Local anesthetic:  Lidocaine 1% w/o epi Laceration details:    Location:  Finger   Finger location:  R index finger   Length (cm):  1 Repair type:    Repair type:  Simple Pre-procedure details:    Preparation:  Patient was prepped and draped in usual sterile fashion Exploration:    Contaminated: yes   Treatment:    Area cleansed with:  Betadine and saline   Amount of cleaning:  Standard   Irrigation solution:  Sterile saline   Irrigation method:  Syringe   Visualized foreign bodies/material removed: no   Skin repair:    Repair method:  Sutures   Suture size:  4-0   Suture technique:  Simple interrupted   Number of sutures:  6 Post-procedure details:    Dressing:  Antibiotic ointment, non-adherent dressing and splint for  protection   Patient tolerance of procedure:  Tolerated well, no immediate complications .Marland KitchenLaceration Repair  Date/Time: 09/13/2018 1:04 PM Performed by: Sable Feil, PA-C Authorized by: Sable Feil, PA-C   Consent:    Consent obtained:  Verbal   Consent given by:  Patient   Risks discussed:  Infection, pain and poor cosmetic result Anesthesia (see MAR for exact dosages):    Anesthesia method:  Nerve block   Block anesthetic:  Lidocaine 1% w/o epi   Block injection procedure:  Anatomic landmarks identified   Block outcome:  Anesthesia achieved Laceration details:    Location:  Finger   Finger location:  R long finger   Length (cm):  1 Repair type:    Repair type:  Simple Pre-procedure details:    Preparation:  Patient was prepped and draped in usual sterile fashion Exploration:    Contaminated: yes   Treatment:    Area cleansed with:  Betadine and saline   Amount of cleaning:  Standard   Irrigation solution:  Sterile saline   Irrigation method:  Syringe   Visualized foreign bodies/material removed: no   Skin repair:    Repair method:  Sutures   Suture size:  4-0   Suture technique:  Simple interrupted   Number of sutures:  6 Approximation:    Approximation:  Close Post-procedure details:    Dressing:  Antibiotic ointment, sterile dressing and splint for protection   Patient tolerance of procedure:  Tolerated well, no immediate complications .Marland KitchenLaceration Repair  Date/Time: 09/13/2018 1:06 PM Performed by: Sable Feil, PA-C Authorized by: Sable Feil, PA-C   Consent:    Consent obtained:  Verbal   Consent given by:  Patient   Risks discussed:  Infection, pain and poor cosmetic result Anesthesia (see MAR for exact dosages):    Anesthesia method:  Nerve block   Block anesthetic:  Lidocaine 1% w/o epi   Block injection procedure:  Anatomic landmarks identified   Block outcome:  Anesthesia achieved Laceration details:    Location:  Finger   Finger  location:  R ring finger   Length (cm):  1.5 Repair type:    Repair type:  Simple Exploration:    Contaminated: yes   Treatment:    Area cleansed with:  Betadine and saline  Amount of cleaning:  Standard   Irrigation method:  Syringe   Visualized foreign bodies/material removed: no   Skin repair:    Repair method:  Sutures   Suture size:  4-0   Suture technique:  Simple interrupted   Number of sutures:  10 Approximation:    Approximation:  Close Post-procedure details:    Dressing:  Antibiotic ointment, sterile dressing and splint for protection   Patient tolerance of procedure:  Tolerated well, no immediate complications     ____________________________________________   INITIAL IMPRESSION / ASSESSMENT AND PLAN / ED COURSE  As part of my medical decision making, I reviewed the following data within the [place]         Patient presents for laceration to the second third and fourth digit of the right hand.  Discussed x-ray findings with patient showing small bone fragments.  Discussed rationale for suturing at this time.  Patient given discharge care instructions and advised to return to ED if wound reopens for healing is complete.  Advised to have sutures removed in 10 days.  Patient discharge medication for Bactrim DS and tramadol.      ____________________________________________   FINAL CLINICAL IMPRESSION(S) / ED DIAGNOSES  Final diagnoses:  Laceration of right middle finger without foreign body, nail damage status unspecified, initial encounter  Laceration of right index finger, foreign body presence unspecified, nail damage status unspecified, initial encounter  Laceration of ring finger without damage to nail, foreign body presence unspecified, unspecified laterality, initial encounter     ED Discharge Orders         Ordered    sulfamethoxazole-trimethoprim (BACTRIM DS) 800-160 MG tablet  2 times daily     09/13/18 1258    traMADol  (ULTRAM) 50 MG tablet  Every 12 hours PRN,   Status:  Discontinued     09/13/18 1258    traMADol (ULTRAM) 50 MG tablet  Every 12 hours PRN,   Status:  Discontinued     09/13/18 1300    traMADol (ULTRAM) 50 MG tablet  Every 12 hours PRN     09/13/18 1300           Note:  This document was prepared using Dragon voice recognition software and may include unintentional dictation errors.    Sable Feil, PA-C 09/13/18 1313    Earleen Newport, MD 09/13/18 [phone]

## 2018-09-13 NOTE — ED Notes (Signed)
See triage note  Presents with laceration to right hand  States he was using a skill saw  Lacerations noted to right 2nd,3rd,4th and 5 th fingers

## 2018-09-13 NOTE — ED Triage Notes (Signed)
Patient presents to the ED with lacerations to his middle 3 fingers on his right hand caused by a saw.  Bleeding is controlled at this time.

## 2018-09-14 ENCOUNTER — Emergency Department
Admission: EM | Admit: 2018-09-14 | Discharge: 2018-09-14 | Disposition: A | Discharge status: Home / Self Care | Payer: PPO | Attending: Emergency Medicine | Admitting: Emergency Medicine

## 2018-09-14 ENCOUNTER — Encounter: Payer: Self-pay | Admitting: Emergency Medicine

## 2018-09-14 DIAGNOSIS — Z4801 Encounter for change or removal of surgical wound dressing: Secondary | ICD-10-CM | POA: Diagnosis not present

## 2018-09-14 DIAGNOSIS — M79641 Pain in right hand: Secondary | ICD-10-CM | POA: Diagnosis not present

## 2018-09-14 DIAGNOSIS — F0390 Unspecified dementia without behavioral disturbance: Secondary | ICD-10-CM | POA: Insufficient documentation

## 2018-09-14 DIAGNOSIS — Z4800 Encounter for change or removal of nonsurgical wound dressing: Secondary | ICD-10-CM | POA: Diagnosis not present

## 2018-09-14 DIAGNOSIS — Z8546 Personal history of malignant neoplasm of prostate: Secondary | ICD-10-CM | POA: Insufficient documentation

## 2018-09-14 DIAGNOSIS — F17210 Nicotine dependence, cigarettes, uncomplicated: Secondary | ICD-10-CM | POA: Insufficient documentation

## 2018-09-14 DIAGNOSIS — Z5189 Encounter for other specified aftercare: Secondary | ICD-10-CM

## 2018-09-14 DIAGNOSIS — Z79899 Other long term (current) drug therapy: Secondary | ICD-10-CM | POA: Insufficient documentation

## 2018-09-14 NOTE — ED Provider Notes (Signed)
[facility] Emergency Department Provider Note  ____________________________________________   First MD Initiated Contact with Patient 09/14/18 [phone]     (approximate)  I have reviewed the triage vital signs and the nursing notes.   HISTORY  Chief Complaint Wound Check   HPI PHENG PROKOP is a 73 y.o. male presents to the ED for wound check and also dressing change.  Patient was seen yesterday for multiple lacerations to his right hand from a saw.  Currently rates his pain as 6/10.      Past Medical History:  Diagnosis Date  . Anxiety   . Arthritis   . Chicken pox   . Dementia ([place])   . Depression   . Gout   . Personal history of tobacco use, presenting hazards to health 08/06/2015  . Prostate cancer ([place])   . Testicular cyst    Dr. Jacqlyn Larsen    Patient Active Problem List   Diagnosis Date Noted  . Solitary pulmonary nodule 09/08/2015  . Atherosclerosis 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 09/08/2015  . Personal history of tobacco use, presenting hazards to health 08/06/2015  . Routine general medical examination at a health care facility 05/05/2015  . Ganglion cyst 04/26/2015  . Malignant neoplasm of prostate ([place]) 12/24/2014  . Insomnia 12/24/2014  . Tobacco abuse 05/20/2014  . Medicare annual wellness visit, subsequent 04/18/2012  . B12 deficiency 01/03/2012  . Depression 12/06/2011  . Dementia ([place]) 12/06/2011  . Neuropathy ([place]) 12/06/2011    Past Surgical History:  Procedure Laterality Date  . GANGLION CYST EXCISION Right 10/08/2015   Procedure: REMOVAL GANGLION CYST FOOT/2 right foot;  Surgeon: Sharlotte Alamo, DPM;  Location: [facility] ORS;  Service: Podiatry;  Laterality: Right;  . KNEE ARTHROSCOPY Right    Dr. Tamala Julian, [facility]  . NASAL SINUS SURGERY     Dr. Nadeen Landau, [facility]    Prior to Admission medications   Medication Sig Start Date End Date Taking? Authorizing Provider  allopurinol (ZYLOPRIM) 100 MG  tablet Take 1 tablet (100 mg total) by mouth daily. 02/08/18   Edrick Kins, DPM  ARIPiprazole (ABILIFY PO) Take by mouth.    [provider]  aspirin 81 MG tablet Take 81 mg by mouth daily.    [provider]  colchicine 0.6 MG tablet Take 0.6 mg by mouth daily as needed.  12/12/13   [provider]  cyanocobalamin (,VITAMIN B-12,) 1000 MCG/ML injection Inject 1 mL (1,000 mcg total) into the muscle every 30 (thirty) days. 09/08/15   Jackolyn Confer, MD  donepezil (ARICEPT) 5 MG tablet Take 1 tablet (5 mg total) by mouth daily. 05/05/15   Jackolyn Confer, MD  gabapentin (NEURONTIN) 100 MG capsule 2 po qHS 11/20/16   [provider]  sertraline (ZOLOFT) 100 MG tablet TAKE 1 TABLET(100 MG) BY MOUTH DAILY 12/25/16   Thersa Salt G, DO  sulfamethoxazole-trimethoprim (BACTRIM DS) 800-160 MG tablet Take 1 tablet by mouth 2 (two) times daily. 09/13/18   Sable Feil, PA-C  traMADol (ULTRAM) 50 MG tablet Take 1 tablet (50 mg total) by mouth every 12 (twelve) hours as needed. 09/13/18   Sable Feil, PA-C  zolpidem (AMBIEN) 10 MG tablet TK 1 T PO NIGHTLY PRF SLEEP 09/29/16   [provider]    Allergies Codeine  Family History  Problem Relation Age of Onset  . Cancer Mother        breast  . Heart disease Mother   .  Cancer Father        prostate  . Heart disease Father   . Breast cancer Neg Hx     Social History Social History   Tobacco Use  . Smoking status: Current Every Day Smoker    Packs/day: 0.25    Types: Cigarettes  . Smokeless tobacco: Current User    Types: Chew  . Tobacco comment: chews tobacco  Substance Use Topics  . Alcohol use: Yes    Alcohol/week: 0.0 standard drinks    Comment: occassionally  . Drug use: No    Review of Systems Constitutional: No fever/chills Cardiovascular: Denies chest pain. Respiratory: Denies shortness of breath. Musculoskeletal: Right hand pain. Skin: Right hand lacerations. Neurological:  Negative for  focal weakness or numbness. ____________________________________________   PHYSICAL EXAM:  VITAL SIGNS: ED Triage Vitals [09/14/18 0742]  Enc Vitals Group     BP 124/64     Pulse Rate 65     Resp 16     Temp (!) 97.5 F (36.4 C)     Temp Source Oral     SpO2 97 %     Weight      Height      Head Circumference      Peak Flow      Pain Score 6     Pain Loc      Pain Edu?      Excl. in [place]?    Constitutional: Alert and oriented. Well appearing and in no acute distress. Eyes: Conjunctivae are normal.  Head: Atraumatic. Neck: No stridor.  Cardiovascular:  Good peripheral circulation. Respiratory: Normal respiratory effort.  No retractions. Musculoskeletal: Right hand with multiple lacerations to the second, third, fourth and fifth digit.  No nail involvement.  No evidence of infection.  Slow range of motion secondary to patient's discomfort.  Capillary refill is still less than 3 seconds. Neurologic:  Normal speech and language. No gross focal neurologic deficits are appreciated.  Skin:  Skin is warm, dry.  Lacerations as noted above. Psychiatric: Mood and affect are normal. Speech and behavior are normal.  ____________________________________________   LABS (all labs ordered are listed, but only abnormal results are displayed)  Labs Reviewed - No data to display ____________________________________________ _______________________________________   PROCEDURES  Procedure(s) performed (including Critical Care):  Procedures Dressings were removed and area was soaked due to gauze being stuck to the wound site.  Areas were cleaned and dressing placed by the nurse.  ____________________________________________   INITIAL IMPRESSION / ASSESSMENT AND PLAN / ED COURSE  As part of my medical decision making, I reviewed the following data within the electronic MEDICAL RECORD NUMBER Notes from prior ED visits and [place] Controlled Substance Database      73 year old  male presents to the ED for dressing change and evaluation of his wounds.  He was seen yesterday cut his right hand.  He has multiple lacerations all of which appear to be healing without any evidence of infection.  X-rays were reviewed from yesterday.  Right hand was clean and dressings were placed to individual fingers.  Patient was given instructions on how to care for this.  He will call his PCP to see if they can remove sutures or he will return to the ED in 10 days for suture removal.  ____________________________________________   FINAL CLINICAL IMPRESSION(S) / ED DIAGNOSES  Final diagnoses:  Encounter for wound re-check     ED Discharge Orders    None       Note:  This document was prepared using Dragon voice recognition software and may include unintentional dictation errors.    Johnn Hai, PA-C 09/14/18 3845    Delman Kitten, MD 09/14/18 1453

## 2018-09-14 NOTE — Discharge Instructions (Addendum)
Keep area clean and dry.  Elevate hand to decrease swelling and also help with pain.  Continue taking your medication as directed.  You may go to your primary care doctor to have sutures removed in 10 days or return to the emergency department.  Leave dressing on for 2 days and then take this dressing off and began cleaning your fingers with mild soap and water daily.  Allow to dry completely.  Watch for any signs of infection and return to the ED if there is any concerns for infection.

## 2018-09-14 NOTE — ED Notes (Signed)
Patient was seen here at [facility] yesterday for laceration to right hand fingers from saw. Patient reports he was told to come back today for wound recheck.

## 2018-09-14 NOTE — ED Notes (Signed)
Patients right hand currently soaking to get guaze off and check wound

## 2018-09-14 NOTE — ED Triage Notes (Signed)
Pt to ED via POV, pt is here for recheck of wound from yesterday. Pt is in NAD.

## 2018-09-19 DIAGNOSIS — L570 Actinic keratosis: Secondary | ICD-10-CM | POA: Diagnosis not present

## 2018-09-19 DIAGNOSIS — X32XXXA Exposure to sunlight, initial encounter: Secondary | ICD-10-CM | POA: Diagnosis not present

## 2018-09-19 DIAGNOSIS — L821 Other seborrheic keratosis: Secondary | ICD-10-CM | POA: Diagnosis not present

## 2018-09-26 DIAGNOSIS — S61411A Laceration without foreign body of right hand, initial encounter: Secondary | ICD-10-CM | POA: Diagnosis not present

## 2018-09-26 DIAGNOSIS — S62650A Nondisplaced fracture of medial phalanx of right index finger, initial encounter for closed fracture: Secondary | ICD-10-CM | POA: Diagnosis not present

## 2018-09-26 DIAGNOSIS — M25541 Pain in joints of right hand: Secondary | ICD-10-CM | POA: Diagnosis not present

## 2018-09-26 DIAGNOSIS — S61219D Laceration without foreign body of unspecified finger without damage to nail, subsequent encounter: Secondary | ICD-10-CM | POA: Diagnosis not present

## 2018-09-26 DIAGNOSIS — S62652A Nondisplaced fracture of medial phalanx of right middle finger, initial encounter for closed fracture: Secondary | ICD-10-CM | POA: Diagnosis not present

## 2018-11-08 ENCOUNTER — Other Ambulatory Visit: Payer: Self-pay

## 2018-11-11 ENCOUNTER — Other Ambulatory Visit: Payer: Self-pay

## 2018-11-11 ENCOUNTER — Inpatient Hospital Stay: Payer: PPO | Attending: Radiation Oncology

## 2018-11-11 DIAGNOSIS — C61 Malignant neoplasm of prostate: Secondary | ICD-10-CM | POA: Diagnosis not present

## 2018-11-11 LAB — PSA: Prostatic Specific Antigen: 0.07 ng/mL (ref 0.00–4.00)

## 2018-11-18 ENCOUNTER — Ambulatory Visit: Payer: PPO | Admitting: Radiation Oncology

## 2018-11-29 ENCOUNTER — Other Ambulatory Visit: Payer: Self-pay | Admitting: Physical Medicine and Rehabilitation

## 2018-11-29 DIAGNOSIS — M5416 Radiculopathy, lumbar region: Secondary | ICD-10-CM | POA: Diagnosis not present

## 2018-11-29 DIAGNOSIS — M48062 Spinal stenosis, lumbar region with neurogenic claudication: Secondary | ICD-10-CM | POA: Diagnosis not present

## 2018-11-29 DIAGNOSIS — M62830 Muscle spasm of back: Secondary | ICD-10-CM | POA: Diagnosis not present

## 2018-11-29 DIAGNOSIS — M5441 Lumbago with sciatica, right side: Secondary | ICD-10-CM | POA: Diagnosis not present

## 2018-11-29 DIAGNOSIS — M5136 Other intervertebral disc degeneration, lumbar region: Secondary | ICD-10-CM | POA: Diagnosis not present

## 2018-12-10 ENCOUNTER — Other Ambulatory Visit: Payer: Self-pay

## 2018-12-10 ENCOUNTER — Ambulatory Visit
Admission: RE | Admit: 2018-12-10 | Discharge: 2018-12-10 | Disposition: A | Discharge status: Home / Self Care | Payer: PPO | Source: Ambulatory Visit | Attending: Physical Medicine and Rehabilitation | Admitting: Physical Medicine and Rehabilitation

## 2018-12-10 DIAGNOSIS — M5416 Radiculopathy, lumbar region: Secondary | ICD-10-CM | POA: Diagnosis not present

## 2018-12-10 DIAGNOSIS — M545 Low back pain: Secondary | ICD-10-CM | POA: Diagnosis not present

## 2018-12-30 DIAGNOSIS — M5416 Radiculopathy, lumbar region: Secondary | ICD-10-CM | POA: Diagnosis not present

## 2018-12-30 DIAGNOSIS — M5136 Other intervertebral disc degeneration, lumbar region: Secondary | ICD-10-CM | POA: Diagnosis not present

## 2018-12-30 DIAGNOSIS — M48062 Spinal stenosis, lumbar region with neurogenic claudication: Secondary | ICD-10-CM | POA: Diagnosis not present

## 2019-01-07 DIAGNOSIS — M48062 Spinal stenosis, lumbar region with neurogenic claudication: Secondary | ICD-10-CM | POA: Diagnosis not present

## 2019-01-07 DIAGNOSIS — M5416 Radiculopathy, lumbar region: Secondary | ICD-10-CM | POA: Diagnosis not present

## 2019-01-07 DIAGNOSIS — M5136 Other intervertebral disc degeneration, lumbar region: Secondary | ICD-10-CM | POA: Diagnosis not present

## 2019-01-31 DIAGNOSIS — B354 Tinea corporis: Secondary | ICD-10-CM | POA: Diagnosis not present

## 2019-02-07 DIAGNOSIS — F5101 Primary insomnia: Secondary | ICD-10-CM | POA: Diagnosis not present

## 2019-02-07 DIAGNOSIS — Z79899 Other long term (current) drug therapy: Secondary | ICD-10-CM | POA: Diagnosis not present

## 2019-02-07 DIAGNOSIS — F17200 Nicotine dependence, unspecified, uncomplicated: Secondary | ICD-10-CM | POA: Diagnosis not present

## 2019-02-07 DIAGNOSIS — R5383 Other fatigue: Secondary | ICD-10-CM | POA: Diagnosis not present

## 2019-02-07 DIAGNOSIS — C61 Malignant neoplasm of prostate: Secondary | ICD-10-CM | POA: Diagnosis not present

## 2019-02-07 DIAGNOSIS — M1A00X0 Idiopathic chronic gout, unspecified site, without tophus (tophi): Secondary | ICD-10-CM | POA: Diagnosis not present

## 2019-02-07 DIAGNOSIS — R634 Abnormal weight loss: Secondary | ICD-10-CM | POA: Diagnosis not present

## 2019-02-10 ENCOUNTER — Other Ambulatory Visit: Payer: Self-pay | Admitting: Physician Assistant

## 2019-02-10 DIAGNOSIS — F17200 Nicotine dependence, unspecified, uncomplicated: Secondary | ICD-10-CM

## 2019-02-10 DIAGNOSIS — R634 Abnormal weight loss: Secondary | ICD-10-CM

## 2019-02-10 DIAGNOSIS — C61 Malignant neoplasm of prostate: Secondary | ICD-10-CM

## 2019-02-20 ENCOUNTER — Other Ambulatory Visit: Payer: Self-pay | Admitting: Physician Assistant

## 2019-02-20 DIAGNOSIS — Z6823 Body mass index (BMI) 23.0-23.9, adult: Secondary | ICD-10-CM | POA: Diagnosis not present

## 2019-02-20 DIAGNOSIS — F17200 Nicotine dependence, unspecified, uncomplicated: Secondary | ICD-10-CM

## 2019-02-20 DIAGNOSIS — M1A09X1 Idiopathic chronic gout, multiple sites, with tophus (tophi): Secondary | ICD-10-CM | POA: Diagnosis not present

## 2019-02-20 DIAGNOSIS — M2550 Pain in unspecified joint: Secondary | ICD-10-CM | POA: Diagnosis not present

## 2019-02-20 DIAGNOSIS — R634 Abnormal weight loss: Secondary | ICD-10-CM

## 2019-02-20 DIAGNOSIS — C61 Malignant neoplasm of prostate: Secondary | ICD-10-CM

## 2019-02-20 DIAGNOSIS — R208 Other disturbances of skin sensation: Secondary | ICD-10-CM | POA: Diagnosis not present

## 2019-02-21 ENCOUNTER — Other Ambulatory Visit: Payer: Self-pay

## 2019-02-21 ENCOUNTER — Ambulatory Visit
Admission: RE | Admit: 2019-02-21 | Discharge: 2019-02-21 | Disposition: A | Discharge status: Home / Self Care | Payer: PPO | Source: Ambulatory Visit | Attending: Physician Assistant | Admitting: Physician Assistant

## 2019-02-21 DIAGNOSIS — C61 Malignant neoplasm of prostate: Secondary | ICD-10-CM | POA: Insufficient documentation

## 2019-02-21 DIAGNOSIS — R918 Other nonspecific abnormal finding of lung field: Secondary | ICD-10-CM | POA: Diagnosis not present

## 2019-02-21 DIAGNOSIS — F17200 Nicotine dependence, unspecified, uncomplicated: Secondary | ICD-10-CM | POA: Diagnosis not present

## 2019-02-21 DIAGNOSIS — R634 Abnormal weight loss: Secondary | ICD-10-CM | POA: Insufficient documentation

## 2019-02-21 MED ORDER — IOHEXOL 300 MG/ML  SOLN
100.0000 mL | Freq: Once | INTRAMUSCULAR | Status: AC | PRN
  Administered 2019-02-21: 100 mL via INTRAVENOUS

## 2019-03-26 DIAGNOSIS — L853 Xerosis cutis: Secondary | ICD-10-CM | POA: Diagnosis not present

## 2019-03-26 DIAGNOSIS — L821 Other seborrheic keratosis: Secondary | ICD-10-CM | POA: Diagnosis not present

## 2019-03-26 DIAGNOSIS — L570 Actinic keratosis: Secondary | ICD-10-CM | POA: Diagnosis not present

## 2019-03-26 DIAGNOSIS — L280 Lichen simplex chronicus: Secondary | ICD-10-CM | POA: Diagnosis not present

## 2019-03-26 DIAGNOSIS — X32XXXA Exposure to sunlight, initial encounter: Secondary | ICD-10-CM | POA: Diagnosis not present

## 2019-04-08 ENCOUNTER — Ambulatory Visit: Payer: PPO | Admitting: Podiatry

## 2019-04-08 ENCOUNTER — Other Ambulatory Visit: Payer: Self-pay

## 2019-04-08 ENCOUNTER — Encounter: Payer: Self-pay | Admitting: Podiatry

## 2019-04-08 ENCOUNTER — Ambulatory Visit (INDEPENDENT_AMBULATORY_CARE_PROVIDER_SITE_OTHER): Payer: PPO

## 2019-04-08 DIAGNOSIS — I739 Peripheral vascular disease, unspecified: Secondary | ICD-10-CM | POA: Diagnosis not present

## 2019-04-08 DIAGNOSIS — R209 Unspecified disturbances of skin sensation: Secondary | ICD-10-CM

## 2019-04-09 ENCOUNTER — Telehealth: Payer: Self-pay

## 2019-04-09 DIAGNOSIS — I739 Peripheral vascular disease, unspecified: Secondary | ICD-10-CM

## 2019-04-09 NOTE — Telephone Encounter (Signed)
-----   Message from Edrick Kins, DPM sent at 04/08/2019  3:43 PM EST ----- Regarding: refer to vascular Please refer to vascular  Dx: PVD b/l LE  Thanks, Dr. Amalia Hailey

## 2019-04-09 NOTE — Telephone Encounter (Signed)
Referral has been entered in chart and faxed to [facility] for scheduling

## 2019-04-11 NOTE — Progress Notes (Signed)
   HPI: 74 y.o. male presenting today with a chief complaint of intermittent cramping, stinging pain in the toes that began about one year ago. He states bending the toes causes the symptoms. He reports associated coldness of the feet. He has been applying Vaseline and "arthritis spray" for treatment. Patient is here for further evaluation and treatment.   Past Medical History:  Diagnosis Date  . Anxiety   . Arthritis   . Chicken pox   . Dementia ([place])   . Depression   . Gout   . Personal history of tobacco use, presenting hazards to health 08/06/2015  . Prostate cancer ([place])   . Testicular cyst    Dr. Jacqlyn Larsen     Physical Exam: General: The patient is alert and oriented x3 in no acute distress.  Dermatology: Skin is warm, dry and supple bilateral lower extremities. Negative for open lesions or macerations.  Vascular: Delayed capillary refill noted bilaterally. Skin is cold to touch. No edema or erythema noted.   Neurological: Epicritic and protective threshold grossly intact bilaterally.   Musculoskeletal Exam: Range of motion within normal limits to all pedal and ankle joints bilateral. Muscle strength 5/5 in all groups bilateral.   Radiographic Exam:  Normal osseous mineralization. Joint spaces preserved. No fracture/dislocation/boney destruction.    Assessment: 1. PVD BLE   Plan of Care:  1. Patient evaluated. X-Rays reviewed.  2. Arterial doppler ordered for BLE.  3. Referral to Vascular placed.  4. Return to clinic as needed.      Edrick Kins, DPM [facility]  Dr. Edrick Kins, DPM    [address], [place] [address]                Office [phone]  Fax [phone]

## 2019-04-14 ENCOUNTER — Other Ambulatory Visit (INDEPENDENT_AMBULATORY_CARE_PROVIDER_SITE_OTHER): Payer: Self-pay | Admitting: Vascular Surgery

## 2019-04-14 ENCOUNTER — Ambulatory Visit (INDEPENDENT_AMBULATORY_CARE_PROVIDER_SITE_OTHER): Payer: PPO

## 2019-04-14 ENCOUNTER — Ambulatory Visit (INDEPENDENT_AMBULATORY_CARE_PROVIDER_SITE_OTHER): Payer: PPO | Admitting: Vascular Surgery

## 2019-04-14 ENCOUNTER — Other Ambulatory Visit: Payer: Self-pay

## 2019-04-14 ENCOUNTER — Encounter (INDEPENDENT_AMBULATORY_CARE_PROVIDER_SITE_OTHER): Payer: Self-pay

## 2019-04-14 ENCOUNTER — Encounter (INDEPENDENT_AMBULATORY_CARE_PROVIDER_SITE_OTHER): Payer: Self-pay | Admitting: Vascular Surgery

## 2019-04-14 VITALS — BP 149/85 | HR 103 | Resp 12 | Ht 74.0 in | Wt 189.0 lb

## 2019-04-14 DIAGNOSIS — R209 Unspecified disturbances of skin sensation: Secondary | ICD-10-CM

## 2019-04-14 DIAGNOSIS — I70223 Atherosclerosis of native arteries of extremities with rest pain, bilateral legs: Secondary | ICD-10-CM | POA: Diagnosis not present

## 2019-04-14 DIAGNOSIS — R0989 Other specified symptoms and signs involving the circulatory and respiratory systems: Secondary | ICD-10-CM

## 2019-04-14 DIAGNOSIS — I2510 Atherosclerotic heart disease of native coronary artery without angina pectoris: Secondary | ICD-10-CM | POA: Diagnosis not present

## 2019-04-14 DIAGNOSIS — M79606 Pain in leg, unspecified: Secondary | ICD-10-CM | POA: Diagnosis not present

## 2019-04-14 DIAGNOSIS — I70229 Atherosclerosis of native arteries of extremities with rest pain, unspecified extremity: Secondary | ICD-10-CM | POA: Insufficient documentation

## 2019-04-14 MED ORDER — OXYCODONE-ACETAMINOPHEN 5-325 MG PO TABS: 1.0000 | ORAL_TABLET | Freq: Three times a day (TID) | ORAL | 0 refills | Status: DC | PRN

## 2019-04-15 ENCOUNTER — Telehealth (INDEPENDENT_AMBULATORY_CARE_PROVIDER_SITE_OTHER): Payer: Self-pay

## 2019-04-15 DIAGNOSIS — M79606 Pain in leg, unspecified: Secondary | ICD-10-CM | POA: Insufficient documentation

## 2019-04-15 DIAGNOSIS — R209 Unspecified disturbances of skin sensation: Secondary | ICD-10-CM | POA: Insufficient documentation

## 2019-04-15 NOTE — Telephone Encounter (Signed)
Patient was seen in office on 04/14/19 and was scheduled for a left leg angio on 04/18/19 with Dr. Delana Meyer with a 8:30 am arrival time to the [facility]. Patient will do covid testing on 04/16/19 between 12:30-2:30 pm at the [facility]. Pre-procedure instructions were discussed and given to the patient.

## 2019-04-15 NOTE — Progress Notes (Signed)
MRN : [id number]  Ian Ross is a 74 y.o. ([birth year]) male who presents with chief complaint of  Chief Complaint  Patient presents with  . Follow-up    PVD  .  History of Present Illness:  The patient is seen at the request of Dr. Amalia Hailey for evaluation of leg pain and review of the noninvasive studies. There has been a significant deterioration in his lower extremity symptoms over the past several months.  The patient notes shortening of their claudication distance and development of moderate to severe rest pain symptoms. No ulcers or wounds have occurred but he notes there has been progressive discoloration of the toes and forefoot bilaterally.  There have been no significant changes to the patient's overall health care.  The patient denies amaurosis fugax or recent TIA symptoms. There are no recent neurological changes noted. The patient denies history of DVT, PE or superficial thrombophlebitis. The patient denies recent episodes of angina or shortness of breath.   ABI's Rt=1.07 and Lt=1.33   Current Meds  Medication Sig  . allopurinol (ZYLOPRIM) 100 MG tablet Take 1 tablet (100 mg total) by mouth daily.  Marland Kitchen aspirin 81 MG chewable tablet Chew 81 mg by mouth daily.  . colchicine 0.6 MG tablet Take 0.6 mg by mouth daily as needed.   . cyanocobalamin (,VITAMIN B-12,) 1000 MCG/ML injection Inject 1 mL (1,000 mcg total) into the muscle every 30 (thirty) days.    Past Medical History:  Diagnosis Date  . Anxiety   . Arthritis   . Chicken pox   . Dementia ([place])   . Depression   . Gout   . Personal history of tobacco use, presenting hazards to health 08/06/2015  . Prostate cancer ([place])   . Testicular cyst    Dr. Jacqlyn Larsen    Past Surgical History:  Procedure Laterality Date  . GANGLION CYST EXCISION Right 10/08/2015   Procedure: REMOVAL GANGLION CYST FOOT/2 right foot;  Surgeon: Sharlotte Alamo, DPM;  Location: [facility] ORS;  Service: Podiatry;  Laterality: Right;  . KNEE ARTHROSCOPY  Right    Dr. Tamala [facility], [facility]  . NASAL SINUS SURGERY     Dr. Nadeen Landau, [facility]    Social History Social History   Tobacco Use  . Smoking status: Current Every Day Smoker    Packs/day: 0.25    Types: Cigarettes  . Smokeless tobacco: Current User    Types: Chew  . Tobacco comment: chews tobacco  Substance Use Topics  . Alcohol use: Yes    Alcohol/week: 0.0 standard drinks    Comment: occassionally  . Drug use: No    Family History Family History  Problem Relation Age of Onset  . Cancer Mother        breast  . Heart disease Mother   . Cancer Father        prostate  . Heart disease Father   . Breast cancer Neg Hx   No family history of bleeding/clotting disorders, porphyria or autoimmune disease   Allergies  Allergen Reactions  . Codeine Other (See Comments) and Nausea Only    Anxiety     REVIEW OF SYSTEMS (Negative unless checked)  Constitutional: [] Weight loss  [] Fever  [] Chills Cardiac: [] Chest pain   [] Chest pressure   [] Palpitations   [] Shortness of breath when laying flat   [] Shortness of breath with exertion. Vascular:  [x] Pain in legs with walking   [x] Pain in legs at rest  [] History of DVT   [] Phlebitis   [] Swelling  in legs   [] Varicose veins   [] Non-healing ulcers Pulmonary:   [] Uses home oxygen   [] Productive cough   [] Hemoptysis   [] Wheeze  [] COPD   [] Asthma Neurologic:  [] Dizziness   [] Seizures   [] History of stroke   [] History of TIA  [] Aphasia   [] Vissual changes   [] Weakness or numbness in arm   [] Weakness or numbness in leg Musculoskeletal:   [] Joint swelling   [] Joint pain   [] Low back pain Hematologic:  [] Easy bruising  [] Easy bleeding   [] Hypercoagulable state   [] Anemic Gastrointestinal:  [] Diarrhea   [] Vomiting  [] Gastroesophageal reflux/heartburn   [] Difficulty swallowing. Genitourinary:  [] Chronic kidney disease   [] Difficult urination  [] Frequent urination   [] Blood in urine Skin:  [] Rashes   [] Ulcers  Psychological:  [] History of anxiety   []   History of major depression.  Physical Examination  Vitals:   04/14/19 1434  BP: (!) 149/85  Pulse: (!) 103  Resp: 12  Weight: 189 lb (85.7 kg)  Height: 6\' 2"  (1.88 m)   Body mass index is 24.27 kg/m. Gen: WD/WN, NAD Head: [place]/AT, No temporalis wasting.  Ear/Nose/Throat: Hearing grossly intact, nares w/o erythema or drainage, poor dentition Eyes: PER, EOMI, sclera nonicteric.  Neck: Supple, no masses.  No bruit or JVD.  Pulmonary:  Good air movement, clear to auscultation bilaterally, no use of accessory muscles.  Cardiac: RRR, normal S1, S2, no Murmurs. Vascular: The toes and forefoot is notable for coldness delayed capillary refill and dependent rubor Vessel Right Left  Radial Palpable Palpable  PT Not Palpable Not Palpable  DP Not Palpable Not Palpable  Gastrointestinal: soft, non-distended. No guarding/no peritoneal signs.  Musculoskeletal: M/S 5/5 throughout.  No deformity or atrophy.  Neurologic: CN 2-12 intact. Pain and light touch intact in extremities.  Symmetrical.  Speech is fluent. Motor exam as listed above. Psychiatric: Judgment intact, Mood & affect appropriate for pt's clinical situation. Dermatologic: + forefoot and toe rashes no ulcers noted.  No changes consistent with cellulitis. Lymph : No Cervical lymphadenopathy, no lichenification or skin changes of chronic lymphedema.  CBC Lab Results  Component Value Date   WBC 7.4 09/30/2015   HGB 15.4 09/30/2015   HCT 45.1 09/30/2015   MCV 98.2 09/30/2015   PLT 149 (L) 09/30/2015    BMET    Component Value Date/Time   NA 139 05/05/2015 1455   K 3.8 05/05/2015 1455   CL 105 05/05/2015 1455   CO2 21 05/05/2015 1455   GLUCOSE 135 (H) 05/05/2015 1455   BUN 13 05/05/2015 1455   CREATININE 1.05 05/05/2015 1455   CALCIUM 9.6 05/05/2015 1455   CrCl cannot be calculated (Patient's most recent lab result is older than the maximum 21 days allowed.).  COAG No results found for: INR, PROTIME  Radiology No  results found.   Assessment/Plan 1. Painful and cold lower extremity Recommend:  The patient has evidence of severe atherosclerotic changes of both lower extremities with rest pain that is associated with preulcerative changes and impending tissue loss of the foot.  This represents a limb threatening ischemia and places the patient at the risk for limb loss.  Patient should undergo angiography of the lower extremities with the hope for intervention for limb salvage.  The risks and benefits as well as the alternative therapies was discussed in detail with the patient.  All questions were answered.  Patient agrees to proceed with angiography.  The patient will follow up with me in the office after the procedure.  2. Atherosclerosis of native artery of both lower extremities with rest pain (HCC) Recommend:  The patient has evidence of severe atherosclerotic changes of both lower extremities with rest pain that is associated with preulcerative changes and impending tissue loss of the foot.  This represents a limb threatening ischemia and places the patient at the risk for limb loss.  Patient should undergo angiography of the lower extremities with the hope for intervention for limb salvage.  The risks and benefits as well as the alternative therapies was discussed in detail with the patient.  All questions were answered.  Patient agrees to proceed with angiography.  The patient will follow up with me in the office after the procedure.   3. Prominent popliteal pulse No evidence of popliteal artery aneurysm by duplex obtained STAT today  4. Atherosclerosis 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 Continue cardiac and antihypertensive medications as already ordered and reviewed, no changes at this time.  Continue statin as ordered and reviewed, no changes at this time  Nitrates PRN for chest pain     Hortencia Pilar, MD  04/15/2019 10:50 AM

## 2019-04-16 ENCOUNTER — Other Ambulatory Visit: Payer: Self-pay

## 2019-04-16 ENCOUNTER — Other Ambulatory Visit
Admission: RE | Admit: 2019-04-16 | Discharge: 2019-04-16 | Disposition: A | Discharge status: Home / Self Care | Payer: PPO | Source: Ambulatory Visit | Attending: Vascular Surgery | Admitting: Vascular Surgery

## 2019-04-16 ENCOUNTER — Other Ambulatory Visit (INDEPENDENT_AMBULATORY_CARE_PROVIDER_SITE_OTHER): Payer: Self-pay | Admitting: Nurse Practitioner

## 2019-04-16 DIAGNOSIS — Z01812 Encounter for preprocedural laboratory examination: Secondary | ICD-10-CM | POA: Insufficient documentation

## 2019-04-16 DIAGNOSIS — Z20822 Contact with and (suspected) exposure to covid-19: Secondary | ICD-10-CM | POA: Insufficient documentation

## 2019-04-17 LAB — SARS CORONAVIRUS 2 (TAT 6-24 HRS): SARS Coronavirus 2: NEGATIVE

## 2019-04-17 MED ORDER — CEFAZOLIN SODIUM-DEXTROSE 2-4 GM/100ML-% IV SOLN
2.0000 g | Freq: Once | INTRAVENOUS | Status: AC
  Administered 2019-04-18: 2 g via INTRAVENOUS

## 2019-04-18 ENCOUNTER — Encounter: Payer: Self-pay | Admitting: Vascular Surgery

## 2019-04-18 ENCOUNTER — Other Ambulatory Visit: Payer: Self-pay

## 2019-04-18 ENCOUNTER — Ambulatory Visit
Admission: RE | Admit: 2019-04-18 | Discharge: 2019-04-18 | Disposition: A | Discharge status: Home / Self Care | Payer: PPO | Attending: Vascular Surgery | Admitting: Vascular Surgery

## 2019-04-18 ENCOUNTER — Encounter
Admission: RE | Disposition: A | Discharge status: Home / Self Care | Payer: Self-pay | Source: Home / Self Care | Attending: Vascular Surgery

## 2019-04-18 DIAGNOSIS — I998 Other disorder of circulatory system: Secondary | ICD-10-CM | POA: Diagnosis not present

## 2019-04-18 DIAGNOSIS — Z7982 Long term (current) use of aspirin: Secondary | ICD-10-CM | POA: Insufficient documentation

## 2019-04-18 DIAGNOSIS — M109 Gout, unspecified: Secondary | ICD-10-CM | POA: Insufficient documentation

## 2019-04-18 DIAGNOSIS — I2510 Atherosclerotic heart disease of native coronary artery without angina pectoris: Secondary | ICD-10-CM | POA: Diagnosis not present

## 2019-04-18 DIAGNOSIS — F329 Major depressive disorder, single episode, unspecified: Secondary | ICD-10-CM | POA: Diagnosis not present

## 2019-04-18 DIAGNOSIS — F419 Anxiety disorder, unspecified: Secondary | ICD-10-CM | POA: Diagnosis not present

## 2019-04-18 DIAGNOSIS — Z79899 Other long term (current) drug therapy: Secondary | ICD-10-CM | POA: Insufficient documentation

## 2019-04-18 DIAGNOSIS — I70223 Atherosclerosis of native arteries of extremities with rest pain, bilateral legs: Secondary | ICD-10-CM | POA: Diagnosis not present

## 2019-04-18 DIAGNOSIS — I70229 Atherosclerosis of native arteries of extremities with rest pain, unspecified extremity: Secondary | ICD-10-CM

## 2019-04-18 DIAGNOSIS — F0390 Unspecified dementia without behavioral disturbance: Secondary | ICD-10-CM | POA: Insufficient documentation

## 2019-04-18 HISTORY — PX: LOWER EXTREMITY ANGIOGRAPHY: CATH118251

## 2019-04-18 LAB — CREATININE, SERUM
Creatinine, Ser: 0.79 mg/dL (ref 0.61–1.24)
GFR calc Af Amer: >60 mL/min (ref >60)
GFR calc non Af Amer: >60 mL/min (ref >60)

## 2019-04-18 LAB — BUN: BUN: 7 mg/dL — ABNORMAL LOW (ref 8–23)

## 2019-04-18 SURGERY — LOWER EXTREMITY ANGIOGRAPHY
Anesthesia: Moderate Sedation | Site: Leg Lower | Laterality: Left

## 2019-04-18 MED ORDER — ONDANSETRON HCL 4 MG/2ML IJ SOLN: 4.0000 mg | Freq: Four times a day (QID) | INTRAMUSCULAR | Status: DC | PRN

## 2019-04-18 MED ORDER — METHYLPREDNISOLONE SODIUM SUCC 125 MG IJ SOLR: 125.0000 mg | Freq: Once | INTRAMUSCULAR | Status: DC | PRN

## 2019-04-18 MED ORDER — MIDAZOLAM HCL 2 MG/2ML IJ SOLN
INTRAMUSCULAR | Status: DC | PRN
  Administered 2019-04-18: 2 mg via INTRAVENOUS
  Administered 2019-04-18: 0.5 mg via INTRAVENOUS

## 2019-04-18 MED ORDER — SODIUM CHLORIDE 0.9 % IV SOLN: INTRAVENOUS | Status: DC

## 2019-04-18 MED ORDER — HYDROMORPHONE HCL 1 MG/ML IJ SOLN: 1.0000 mg | Freq: Once | INTRAMUSCULAR | Status: DC | PRN

## 2019-04-18 MED ORDER — FAMOTIDINE 20 MG PO TABS: 40.0000 mg | ORAL_TABLET | Freq: Once | ORAL | Status: DC | PRN

## 2019-04-18 MED ORDER — MIDAZOLAM HCL 2 MG/ML PO SYRP: 8.0000 mg | ORAL_SOLUTION | Freq: Once | ORAL | Status: DC | PRN

## 2019-04-18 MED ORDER — MORPHINE SULFATE (PF) 4 MG/ML IV SOLN: 2.0000 mg | INTRAVENOUS | Status: DC | PRN

## 2019-04-18 MED ORDER — DIPHENHYDRAMINE HCL 50 MG/ML IJ SOLN: 50.0000 mg | Freq: Once | INTRAMUSCULAR | Status: DC | PRN

## 2019-04-18 MED ORDER — AMLODIPINE BESYLATE 5 MG PO TABS: 5.0000 mg | ORAL_TABLET | Freq: Every day | ORAL | 11 refills | Status: DC

## 2019-04-18 MED ORDER — FENTANYL CITRATE (PF) 100 MCG/2ML IJ SOLN
INTRAMUSCULAR | Status: DC | PRN
  Administered 2019-04-18: 50 ug via INTRAVENOUS
  Administered 2019-04-18: 25 ug via INTRAVENOUS

## 2019-04-18 MED ORDER — MIDAZOLAM HCL 5 MG/5ML IJ SOLN
INTRAMUSCULAR | Status: AC
  Filled 2019-04-18: qty 5

## 2019-04-18 MED ORDER — HEPARIN SODIUM (PORCINE) 1000 UNIT/ML IJ SOLN
INTRAMUSCULAR | Status: AC
  Filled 2019-04-18: qty 1

## 2019-04-18 MED ORDER — FENTANYL CITRATE (PF) 100 MCG/2ML IJ SOLN
INTRAMUSCULAR | Status: AC
  Filled 2019-04-18: qty 2

## 2019-04-18 SURGICAL SUPPLY — 11 items
CATH PIG 70CM (CATHETERS) ×1 IMPLANT
DEVICE STARCLOSE SE CLOSURE (Vascular Products) ×1 IMPLANT
GLIDEWIRE ADV .035X260CM (WIRE) ×1 IMPLANT
NDL ENTRY 21GA 7CM ECHOTIP (NEEDLE) IMPLANT
NEEDLE ENTRY 21GA 7CM ECHOTIP (NEEDLE) ×2 IMPLANT
PACK ANGIOGRAPHY (CUSTOM PROCEDURE TRAY) ×2 IMPLANT
SET INTRO CAPELLA COAXIAL (SET/KITS/TRAYS/PACK) ×1 IMPLANT
SHEATH BRITE TIP 5FRX11 (SHEATH) ×1 IMPLANT
SYR MEDRAD MARK 7 150ML (SYRINGE) ×1 IMPLANT
TUBING CONTRAST HIGH PRESS 72 (TUBING) ×1 IMPLANT
WIRE J 3MM .035X145CM (WIRE) ×1 IMPLANT

## 2019-04-18 NOTE — Op Note (Signed)
[facility]  Percutaneous Study/Intervention Procedural Note   Date of Surgery: 04/18/2019,9:20 AM  Surgeon:Ian Ross, Ian Ross   Pre-operative Diagnosis: Ischemic changes of the forefeet bilaterally with absence of the dorsalis pedis pulse  Post-operative diagnosis:  Same  Procedure(s) Performed:  1.  Abdominal aortogram  2.  Introduction catheter left lower extremity third order catheter placement  3.  Bilateral lower extremity distal runoff  4.  Star close right common femoral   Anesthesia: Conscious sedation was administered by the interventional radiology RN under my direct supervision. IV Versed plus fentanyl were utilized. Continuous ECG, pulse oximetry and blood pressure was monitored throughout the entire procedure.  Conscious sedation was administered for a total of 30 minutes.  Sheath: 5 French 11 cm Pinnacle sheath retrograde right common femoral  Contrast: 125 cc   Fluoroscopy Time: 5.2 minutes  Indications:  The patient presents to [facility] with ischemic changes to both feet.  Pedal pulses are nonpalpable bilaterally suggesting atherosclerotic occlusive disease.  The risks and benefits as well as alternative therapies for lower extremity revascularization are reviewed with the patient all questions are answered the patient agrees to proceed.  The patient is therefore undergoing angiography with the hope for intervention for limb salvage.   Procedure:  Ian Ross a 74 y.o. male who was identified and appropriate procedural time out was performed.  The patient was then placed supine on the table and prepped and draped in the usual sterile fashion.  Ultrasound was used to evaluate the right common femoral artery.  It was echolucent and pulsatile indicating it is patent .  An ultrasound image was acquired for the permanent record.  A micropuncture needle was used to access the right common femoral artery under direct ultrasound guidance.   The microwire was then advanced under fluoroscopic guidance without difficulty followed by the micro-sheath.  A 0.035 J wire was advanced without resistance and a 5Fr sheath was placed.    Pigtail catheter was then advanced to the level of T12 and AP projection of the aorta was obtained. Pigtail catheter was then repositioned to above the bifurcation and AP view of the pelvis was obtained. Stiff angled Glidewire and pigtail catheter was then used across the bifurcation and the catheter was positioned in the distal external iliac artery.  LAO view of the left groin was then obtained. Wire was reintroduced and negotiated into the SFA and the catheter was advanced into the SFA. Distal runoff was then performed.  Hand-injection through the right common femoral sheath was used to create imaging of the right lower extremity  After review of the images the catheter was removed over wire and an RAO view of the groin was obtained. StarClose device was deployed without difficulty.   Findings:   Aortogram: The abdominal aorta is opacified with a bolus injection of contrast beginning just above the level of the diaphragms.  There is no evidence of ulceration or atherosclerotic plaque formation.  All of the visceral vessels are opacified rapidly renal arteries are widely patent.  All of the paired lumbars are widely patent as well the aorta and bilateral common external and internal iliac arteries are all smooth and free of any obstruction stricture or atherosclerotic changes  Right Lower Extremity: The right common femoral profunda femoris superficial femoral-popliteal and tibial vessels are widely patent.  The posterior tibial is the dominant runoff and fills the lateral and medial plantar vessels which fills the pedal arch for rays 1 through 3.  There is filling although it is somewhat diminished of the fourth and fifth rays.  The anterior tibial does not appear to fill a true dorsalis pedis.  The peroneal is patent  throughout its course and at the ankle extends a collateral toward the lateral aspect of the foot.  It does not contribute significantly to the distal posterior tibial or plantar vessels.  This appears to be an insignificant variation based on filling from the posterior tibial.  Left Lower Extremity: The common femoral profunda femoris and superficial femoral are widely patent.  There are no atherosclerotic changes or irregularities.  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  The anterior tibial is hypoplastic and the peroneal is quite large and contributes directly to the pedal arch there is complete filling of the pedal arch in all rays via the peroneal and the posterior tibial which are rather large tibial vessels.  There is no evidence of distal small vessel disease.  Given the findings at angiography vasospasm would appear to be a more likely cause for his forefoot and toe changes.  I will start him on 5 mg of Norvasc to see if this improves his symptoms   Disposition: Patient was taken to the recovery room in stable condition having tolerated the procedure well.  Ian Ross 04/18/2019,9:20 AM

## 2019-04-18 NOTE — H&P (Signed)
[facility] History & Physical Update  The patient was interviewed and re-examined.  The patient's previous History and Physical has been reviewed and is unchanged.  There is no change in the plan of care. We plan to proceed with the scheduled procedure.  Hortencia Pilar, MD  04/18/2019, 7:59 AM

## 2019-05-02 ENCOUNTER — Ambulatory Visit: Payer: PPO | Admitting: Podiatry

## 2019-05-02 ENCOUNTER — Other Ambulatory Visit: Payer: Self-pay

## 2019-05-02 DIAGNOSIS — I739 Peripheral vascular disease, unspecified: Secondary | ICD-10-CM | POA: Diagnosis not present

## 2019-05-02 DIAGNOSIS — M659 Synovitis and tenosynovitis, unspecified: Secondary | ICD-10-CM

## 2019-05-07 NOTE — Progress Notes (Signed)
   HPI: 74 y.o. male presenting today for follow up evaluation of PVD of the bilateral lower extremities. He reports worsening pain with associated redness. He states he was seen by vascular, had a doppler done and was told there were no abnormal findings. He denies modifying factors. Patient is here for further evaluation and treatment.   Past Medical History:  Diagnosis Date  . Anxiety   . Arthritis   . Chicken pox   . Dementia ([place])   . Depression   . Gout   . Personal history of tobacco use, presenting hazards to health 08/06/2015  . Prostate cancer ([place])   . Testicular cyst    Dr. Jacqlyn Larsen     Physical Exam: General: The patient is alert and oriented x3 in no acute distress.  Dermatology: Skin is warm, dry and supple bilateral lower extremities. Negative for open lesions or macerations.  Vascular: Delayed capillary refill noted bilaterally. Skin is cold to touch. No edema or erythema noted.   Neurological: Epicritic and protective threshold grossly intact bilaterally.   Musculoskeletal Exam: Pain with palpation noted to the medial, lateral and anterior aspects of the left ankle joint. Range of motion within normal limits to all pedal and ankle joints bilateral. Muscle strength 5/5 in all groups bilateral.   Assessment: 1. PVD BLE 2. Ankle synovitis / DJD left ankle    Plan of Care:  1. Patient evaluated.  2. Injection of 0.5 mLs Celestone Soluspan injected into the left ankle joint.  3. Patient recently underwent revascularization with Dr. Delana Meyer, cardiovascular.  4. Return to clinic in 2 weeks.      Edrick Kins, DPM [facility]  Dr. Edrick Kins, DPM    [address], [place] [address]                Office [phone]  Fax [phone]

## 2019-05-12 ENCOUNTER — Ambulatory Visit (INDEPENDENT_AMBULATORY_CARE_PROVIDER_SITE_OTHER): Payer: PPO | Admitting: Vascular Surgery

## 2019-05-16 ENCOUNTER — Ambulatory Visit: Payer: PPO | Admitting: Podiatry

## 2019-05-16 ENCOUNTER — Encounter: Payer: Self-pay | Admitting: Podiatry

## 2019-05-16 ENCOUNTER — Other Ambulatory Visit: Payer: Self-pay

## 2019-05-16 DIAGNOSIS — M659 Synovitis and tenosynovitis, unspecified: Secondary | ICD-10-CM

## 2019-05-16 DIAGNOSIS — M19072 Primary osteoarthritis, left ankle and foot: Secondary | ICD-10-CM

## 2019-05-20 NOTE — Progress Notes (Signed)
   HPI: 74 y.o. male presenting today for follow up evaluation of PVD of the bilateral lower extremities and left ankle pain. He states he is doing well. He states the injection helped the ankle pain significantly and would like another. He denies worsening factors at this time. Patient is here for further evaluation and treatment.   Past Medical History:  Diagnosis Date  . Anxiety   . Arthritis   . Chicken pox   . Dementia ([place])   . Depression   . Gout   . Personal history of tobacco use, presenting hazards to health 08/06/2015  . Prostate cancer ([place])   . Testicular cyst    Dr. Jacqlyn Larsen     Objective: Physical Exam General: The patient is alert and oriented x3 in no acute distress.  Dermatology: Skin is cool, dry and supple bilateral lower extremities. Negative for open lesions or macerations.  Vascular: Palpable pedal pulses bilaterally. No edema or erythema noted. Capillary refill within normal limits.  Neurological: Epicritic and protective threshold grossly intact bilaterally.   Musculoskeletal Exam: Pain with palpation noted to the medial, lateral and anterior aspects of the left ankle joint. Range of motion within normal limits to all pedal and ankle joints bilateral. Muscle strength 5/5 in all groups bilateral.    Assessment: 1. PVD BLE with recent revascularization  2. Ankle synovitis / DJD left ankle    Plan of Care:  1. Patient evaluated.  2. Injection of 0.5 mLs Celestone Soluspan injected into the left ankle joint.  3. Cannot take oral NSAIDs.  4. Patient recently underwent revascularization with Dr. Delana Meyer, cardiovascular. 5. Return to clinic as needed.      Edrick Kins, DPM [facility]  Dr. Edrick Kins, DPM    [address],  [address]                Office [phone]  Fax [phone]

## 2019-05-29 ENCOUNTER — Telehealth: Payer: Self-pay | Admitting: *Deleted

## 2019-05-29 DIAGNOSIS — Z87891 Personal history of nicotine dependence: Secondary | ICD-10-CM

## 2019-05-29 NOTE — Telephone Encounter (Signed)
(  05/29/2019) Patient has been notified that lung cancer screening CT scan is due currently or will be in near future. Confirmed that patient is within the appropriate age range, and asymptomatic. Patient denies illness that would prevent curative treatment for lung cancer if found. Verified smoking history (Current Smoker,0.25 ppd ). Patient is agreeable for CT scan being scheduled, no time or day preference SRW

## 2019-05-30 NOTE — Addendum Note (Signed)
Addended by: Lieutenant Diego on: 05/30/2019 02:11 PM   Modules accepted: Orders

## 2019-05-30 NOTE — Telephone Encounter (Signed)
Smoking history: current, 61.25 pack year

## 2019-06-02 DIAGNOSIS — M5136 Other intervertebral disc degeneration, lumbar region: Secondary | ICD-10-CM | POA: Diagnosis not present

## 2019-06-02 DIAGNOSIS — M62830 Muscle spasm of back: Secondary | ICD-10-CM | POA: Diagnosis not present

## 2019-06-02 DIAGNOSIS — M5416 Radiculopathy, lumbar region: Secondary | ICD-10-CM | POA: Diagnosis not present

## 2019-06-02 DIAGNOSIS — M48062 Spinal stenosis, lumbar region with neurogenic claudication: Secondary | ICD-10-CM | POA: Diagnosis not present

## 2019-06-04 ENCOUNTER — Other Ambulatory Visit: Payer: Self-pay

## 2019-06-04 ENCOUNTER — Ambulatory Visit
Admission: RE | Admit: 2019-06-04 | Discharge: 2019-06-04 | Disposition: A | Discharge status: Home / Self Care | Payer: PPO | Source: Ambulatory Visit | Attending: Oncology | Admitting: Oncology

## 2019-06-04 DIAGNOSIS — Z87891 Personal history of nicotine dependence: Secondary | ICD-10-CM | POA: Insufficient documentation

## 2019-06-04 DIAGNOSIS — F17210 Nicotine dependence, cigarettes, uncomplicated: Secondary | ICD-10-CM | POA: Diagnosis not present

## 2019-06-12 ENCOUNTER — Encounter: Payer: Self-pay | Admitting: *Deleted

## 2019-07-02 ENCOUNTER — Other Ambulatory Visit: Payer: Self-pay | Admitting: Podiatry

## 2019-07-02 DIAGNOSIS — I739 Peripheral vascular disease, unspecified: Secondary | ICD-10-CM

## 2019-07-10 DIAGNOSIS — M48062 Spinal stenosis, lumbar region with neurogenic claudication: Secondary | ICD-10-CM | POA: Diagnosis not present

## 2019-07-10 DIAGNOSIS — M5136 Other intervertebral disc degeneration, lumbar region: Secondary | ICD-10-CM | POA: Diagnosis not present

## 2019-07-10 DIAGNOSIS — M5416 Radiculopathy, lumbar region: Secondary | ICD-10-CM | POA: Diagnosis not present

## 2019-08-20 DIAGNOSIS — Z6824 Body mass index (BMI) 24.0-24.9, adult: Secondary | ICD-10-CM | POA: Diagnosis not present

## 2019-08-20 DIAGNOSIS — R208 Other disturbances of skin sensation: Secondary | ICD-10-CM | POA: Diagnosis not present

## 2019-08-20 DIAGNOSIS — M2550 Pain in unspecified joint: Secondary | ICD-10-CM | POA: Diagnosis not present

## 2019-08-20 DIAGNOSIS — M1A09X1 Idiopathic chronic gout, multiple sites, with tophus (tophi): Secondary | ICD-10-CM | POA: Diagnosis not present

## 2019-09-15 DIAGNOSIS — H2513 Age-related nuclear cataract, bilateral: Secondary | ICD-10-CM | POA: Diagnosis not present

## 2020-01-21 DIAGNOSIS — M5416 Radiculopathy, lumbar region: Secondary | ICD-10-CM | POA: Diagnosis not present

## 2020-01-21 DIAGNOSIS — M1711 Unilateral primary osteoarthritis, right knee: Secondary | ICD-10-CM | POA: Diagnosis not present

## 2020-01-21 DIAGNOSIS — M48062 Spinal stenosis, lumbar region with neurogenic claudication: Secondary | ICD-10-CM | POA: Diagnosis not present

## 2020-01-21 DIAGNOSIS — M19012 Primary osteoarthritis, left shoulder: Secondary | ICD-10-CM | POA: Diagnosis not present

## 2020-01-21 DIAGNOSIS — M5136 Other intervertebral disc degeneration, lumbar region: Secondary | ICD-10-CM | POA: Diagnosis not present

## 2020-02-04 DIAGNOSIS — M19012 Primary osteoarthritis, left shoulder: Secondary | ICD-10-CM | POA: Diagnosis not present

## 2020-02-09 DIAGNOSIS — H353131 Nonexudative age-related macular degeneration, bilateral, early dry stage: Secondary | ICD-10-CM | POA: Diagnosis not present

## 2020-02-09 DIAGNOSIS — H2513 Age-related nuclear cataract, bilateral: Secondary | ICD-10-CM | POA: Diagnosis not present

## 2020-02-16 DIAGNOSIS — E782 Mixed hyperlipidemia: Secondary | ICD-10-CM | POA: Diagnosis not present

## 2020-02-16 DIAGNOSIS — Z1211 Encounter for screening for malignant neoplasm of colon: Secondary | ICD-10-CM | POA: Diagnosis not present

## 2020-02-16 DIAGNOSIS — I2510 Atherosclerotic heart disease of native coronary artery without angina pectoris: Secondary | ICD-10-CM | POA: Diagnosis not present

## 2020-02-16 DIAGNOSIS — E781 Pure hyperglyceridemia: Secondary | ICD-10-CM | POA: Diagnosis not present

## 2020-02-16 DIAGNOSIS — M1A00X0 Idiopathic chronic gout, unspecified site, without tophus (tophi): Secondary | ICD-10-CM | POA: Diagnosis not present

## 2020-02-16 DIAGNOSIS — F17200 Nicotine dependence, unspecified, uncomplicated: Secondary | ICD-10-CM | POA: Diagnosis not present

## 2020-02-16 DIAGNOSIS — F5101 Primary insomnia: Secondary | ICD-10-CM | POA: Diagnosis not present

## 2020-02-16 DIAGNOSIS — Z0000 Encounter for general adult medical examination without abnormal findings: Secondary | ICD-10-CM | POA: Diagnosis not present

## 2020-02-16 DIAGNOSIS — C61 Malignant neoplasm of prostate: Secondary | ICD-10-CM | POA: Diagnosis not present

## 2020-02-16 DIAGNOSIS — I10 Essential (primary) hypertension: Secondary | ICD-10-CM | POA: Diagnosis not present

## 2020-03-24 DIAGNOSIS — L853 Xerosis cutis: Secondary | ICD-10-CM | POA: Diagnosis not present

## 2020-03-24 DIAGNOSIS — L570 Actinic keratosis: Secondary | ICD-10-CM | POA: Diagnosis not present

## 2020-04-09 ENCOUNTER — Other Ambulatory Visit (INDEPENDENT_AMBULATORY_CARE_PROVIDER_SITE_OTHER): Payer: Self-pay | Admitting: Vascular Surgery

## 2020-04-12 DIAGNOSIS — H2511 Age-related nuclear cataract, right eye: Secondary | ICD-10-CM | POA: Diagnosis not present

## 2020-04-12 DIAGNOSIS — I10 Essential (primary) hypertension: Secondary | ICD-10-CM | POA: Diagnosis not present

## 2020-04-13 ENCOUNTER — Encounter: Payer: Self-pay | Admitting: Ophthalmology

## 2020-04-13 ENCOUNTER — Other Ambulatory Visit: Payer: Self-pay

## 2020-04-19 ENCOUNTER — Other Ambulatory Visit: Payer: Self-pay

## 2020-04-19 ENCOUNTER — Other Ambulatory Visit
Admission: RE | Admit: 2020-04-19 | Discharge: 2020-04-19 | Disposition: A | Discharge status: Home / Self Care | Payer: PPO | Source: Ambulatory Visit | Attending: Ophthalmology | Admitting: Ophthalmology

## 2020-04-19 DIAGNOSIS — Z01812 Encounter for preprocedural laboratory examination: Secondary | ICD-10-CM | POA: Insufficient documentation

## 2020-04-19 DIAGNOSIS — Z20822 Contact with and (suspected) exposure to covid-19: Secondary | ICD-10-CM | POA: Diagnosis not present

## 2020-04-19 LAB — SARS CORONAVIRUS 2 (TAT 6-24 HRS): SARS Coronavirus 2: NEGATIVE

## 2020-04-20 NOTE — Discharge Instructions (Signed)

## 2020-04-21 ENCOUNTER — Other Ambulatory Visit: Payer: Self-pay

## 2020-04-21 ENCOUNTER — Encounter: Payer: Self-pay | Admitting: Ophthalmology

## 2020-04-21 ENCOUNTER — Ambulatory Visit
Admission: RE | Admit: 2020-04-21 | Discharge: 2020-04-21 | Disposition: A | Discharge status: Home / Self Care | Payer: PPO | Attending: Ophthalmology | Admitting: Ophthalmology

## 2020-04-21 ENCOUNTER — Ambulatory Visit: Payer: PPO | Admitting: Anesthesiology

## 2020-04-21 ENCOUNTER — Encounter
Admission: RE | Disposition: A | Discharge status: Home / Self Care | Payer: Self-pay | Source: Home / Self Care | Attending: Ophthalmology

## 2020-04-21 DIAGNOSIS — Z885 Allergy status to narcotic agent status: Secondary | ICD-10-CM | POA: Insufficient documentation

## 2020-04-21 DIAGNOSIS — H2511 Age-related nuclear cataract, right eye: Secondary | ICD-10-CM | POA: Diagnosis not present

## 2020-04-21 DIAGNOSIS — Z7982 Long term (current) use of aspirin: Secondary | ICD-10-CM | POA: Diagnosis not present

## 2020-04-21 DIAGNOSIS — Z79899 Other long term (current) drug therapy: Secondary | ICD-10-CM | POA: Insufficient documentation

## 2020-04-21 DIAGNOSIS — H25811 Combined forms of age-related cataract, right eye: Secondary | ICD-10-CM | POA: Diagnosis not present

## 2020-04-21 HISTORY — PX: CATARACT EXTRACTION W/PHACO: SHX586

## 2020-04-21 HISTORY — DX: Essential (primary) hypertension: I10

## 2020-04-21 SURGERY — PHACOEMULSIFICATION, CATARACT, WITH IOL INSERTION
Anesthesia: Monitor Anesthesia Care | Site: Eye | Laterality: Right

## 2020-04-21 MED ORDER — CEFUROXIME OPHTHALMIC INJECTION 1 MG/0.1 ML
INJECTION | OPHTHALMIC | Status: DC | PRN
  Administered 2020-04-21: 0.1 mL via INTRACAMERAL

## 2020-04-21 MED ORDER — MIDAZOLAM HCL 2 MG/2ML IJ SOLN
INTRAMUSCULAR | Status: DC | PRN
  Administered 2020-04-21: 2 mg via INTRAVENOUS

## 2020-04-21 MED ORDER — NA HYALUR & NA CHOND-NA HYALUR 0.4-0.35 ML IO KIT
PACK | INTRAOCULAR | Status: DC | PRN
  Administered 2020-04-21: 1 mL via INTRAOCULAR

## 2020-04-21 MED ORDER — LACTATED RINGERS IV SOLN: INTRAVENOUS | Status: DC

## 2020-04-21 MED ORDER — LIDOCAINE HCL (PF) 2 % IJ SOLN
INTRAOCULAR | Status: DC | PRN
  Administered 2020-04-21: 2 mL

## 2020-04-21 MED ORDER — FENTANYL CITRATE (PF) 100 MCG/2ML IJ SOLN
INTRAMUSCULAR | Status: DC | PRN
  Administered 2020-04-21 (×2): 50 ug via INTRAVENOUS

## 2020-04-21 MED ORDER — BRIMONIDINE TARTRATE-TIMOLOL 0.2-0.5 % OP SOLN
OPHTHALMIC | Status: DC | PRN
  Administered 2020-04-21: 1 [drp] via OPHTHALMIC

## 2020-04-21 MED ORDER — ARMC OPHTHALMIC DILATING DROPS
1.0000 "application " | OPHTHALMIC | Status: DC | PRN
  Administered 2020-04-21 (×3): 1 via OPHTHALMIC

## 2020-04-21 MED ORDER — TETRACAINE HCL 0.5 % OP SOLN
1.0000 [drp] | OPHTHALMIC | Status: DC | PRN
  Administered 2020-04-21 (×3): 1 [drp] via OPHTHALMIC

## 2020-04-21 MED ORDER — EPINEPHRINE PF 1 MG/ML IJ SOLN
INTRAOCULAR | Status: DC | PRN
  Administered 2020-04-21: 64 mL via OPHTHALMIC

## 2020-04-21 SURGICAL SUPPLY — 23 items
CANNULA ANT/CHMB 27G (MISCELLANEOUS) ×1 IMPLANT
CANNULA ANT/CHMB 27GA (MISCELLANEOUS) ×2 IMPLANT
GLOVE SURG LX 7.5 STRW (GLOVE) ×1
GLOVE SURG LX STRL 7.5 STRW (GLOVE) ×1 IMPLANT
GLOVE SURG TRIUMPH 8.0 PF LTX (GLOVE) ×2 IMPLANT
GOWN STRL REUS W/ TWL LRG LVL3 (GOWN DISPOSABLE) ×2 IMPLANT
GOWN STRL REUS W/TWL LRG LVL3 (GOWN DISPOSABLE) ×4
LENS IOL DIOP 17.0 (Intraocular Lens) ×2 IMPLANT
LENS IOL TECNIS MONO 17.0 (Intraocular Lens) IMPLANT
MARKER SKIN DUAL TIP RULER LAB (MISCELLANEOUS) ×2 IMPLANT
NDL CAPSULORHEX 25GA (NEEDLE) ×1 IMPLANT
NDL FILTER BLUNT 18X1 1/2 (NEEDLE) ×2 IMPLANT
NEEDLE CAPSULORHEX 25GA (NEEDLE) ×2 IMPLANT
NEEDLE FILTER BLUNT 18X 1/2SAF (NEEDLE) ×2
NEEDLE FILTER BLUNT 18X1 1/2 (NEEDLE) ×2 IMPLANT
PACK CATARACT BRASINGTON (MISCELLANEOUS) ×2 IMPLANT
PACK EYE AFTER SURG (MISCELLANEOUS) ×2 IMPLANT
PACK OPTHALMIC (MISCELLANEOUS) ×2 IMPLANT
SOLUTION OPHTHALMIC SALT (MISCELLANEOUS) ×2 IMPLANT
SYR 3ML LL SCALE MARK (SYRINGE) ×4 IMPLANT
SYR TB 1ML LUER SLIP (SYRINGE) ×2 IMPLANT
WATER STERILE IRR 250ML POUR (IV SOLUTION) ×2 IMPLANT
WIPE NON LINTING 3.25X3.25 (MISCELLANEOUS) ×2 IMPLANT

## 2020-04-21 NOTE — H&P (Signed)

## 2020-04-21 NOTE — Anesthesia Preprocedure Evaluation (Signed)
Anesthesia Evaluation  Patient identified by MRN, date of birth, ID band Patient awake    Reviewed: Allergy & Precautions, H&P , NPO status , Patient's Chart, lab work & pertinent test results, reviewed documented beta blocker date and time   Airway Mallampati: II  TM Distance: >3 FB Neck ROM: full    Dental no notable dental hx.    Pulmonary neg pulmonary ROS, former smoker,    Pulmonary exam normal breath sounds clear to auscultation       Cardiovascular Exercise Tolerance: Good hypertension, + CAD   Rhythm:regular Rate:Normal     Neuro/Psych Anxiety Depression Dementia negative neurological ROS     GI/Hepatic negative GI ROS, Neg liver ROS,   Endo/Other  negative endocrine ROS  Renal/GU negative Renal ROS  negative genitourinary   Musculoskeletal   Abdominal   Peds  Hematology negative hematology ROS (+)   Anesthesia Other Findings   Reproductive/Obstetrics negative OB ROS                             Anesthesia Physical Anesthesia Plan  ASA: II  Anesthesia Plan: MAC   Post-op Pain Management:    Induction:   PONV Risk Score and Plan: 1 and Treatment may vary due to age or medical condition  Airway Management Planned:   Additional Equipment:   Intra-op Plan:   Post-operative Plan:   Informed Consent: I have reviewed the patients History and Physical, chart, labs and discussed the procedure including the risks, benefits and alternatives for the proposed anesthesia with the patient or authorized representative who has indicated his/her understanding and acceptance.     Dental Advisory Given  Plan Discussed with: CRNA  Anesthesia Plan Comments:         Anesthesia Quick Evaluation

## 2020-04-21 NOTE — Op Note (Signed)
LOCATION:  [facility]   PREOPERATIVE DIAGNOSIS:    Nuclear sclerotic cataract right eye. H25.11   POSTOPERATIVE DIAGNOSIS:  Nuclear sclerotic cataract right eye.     PROCEDURE:  Phacoemusification with posterior chamber intraocular lens placement of the right eye   ULTRASOUND TIME: Procedure(s): CATARACT EXTRACTION PHACO AND INTRAOCULAR LENS PLACEMENT (IOC) RIGHT 5.74 01:01.7 9.3% (Right)  LENS:   Implant Name Type Inv. Item Serial No. Manufacturer Lot No. LRB No. Used Action  LENS IOL DIOP 17.0 - [id number] Intraocular Lens LENS IOL DIOP 17.0 [id number]   Right 1 Implanted         SURGEON:  Wyonia Hough, MD   ANESTHESIA:  Topical with tetracaine drops and 2% Xylocaine jelly, augmented with 1% preservative-free intracameral lidocaine.    COMPLICATIONS:  None.   DESCRIPTION OF PROCEDURE:  The patient was identified in the holding room and transported to the operating room and placed in the supine position under the operating microscope.  The right eye was identified as the operative eye and it was prepped and draped in the usual sterile ophthalmic fashion.   A 1 millimeter clear-corneal paracentesis was made at the 12:00 position.  0.5 ml of preservative-free 1% lidocaine was injected into the anterior chamber. The anterior chamber was filled with Viscoat viscoelastic.  A 2.4 millimeter keratome was used to make a near-clear corneal incision at the 9:00 position.  A curvilinear capsulorrhexis was made with a cystotome and capsulorrhexis forceps.  Balanced salt solution was used to hydrodissect and hydrodelineate the nucleus.   Phacoemulsification was then used in stop and chop fashion to remove the lens nucleus and epinucleus.  The remaining cortex was then removed using the irrigation and aspiration handpiece. Provisc was then placed into the capsular bag to distend it for lens placement.  A lens was then injected into the capsular bag.  The remaining  viscoelastic was aspirated.   Wounds were hydrated with balanced salt solution.  The anterior chamber was inflated to a physiologic pressure with balanced salt solution.  No wound leaks were noted. Cefuroxime 0.1 ml of a 10mg /ml solution was injected into the anterior chamber for a dose of 1 mg of intracameral antibiotic at the completion of the case.   Timolol and Brimonidine drops were applied to the eye.  The patient was taken to the recovery room in stable condition without complications of anesthesia or surgery.   Zayn Selley 04/21/2020, 10:04 AM

## 2020-04-21 NOTE — Anesthesia Postprocedure Evaluation (Signed)
Anesthesia Post Note  Patient: Ian Ross  Procedure(s) Performed: CATARACT EXTRACTION PHACO AND INTRAOCULAR LENS PLACEMENT (IOC) RIGHT 5.74 01:01.7 9.3% (Right Eye)     Patient location during evaluation: PACU Anesthesia Type: MAC Level of consciousness: awake and alert Pain management: pain level controlled Vital Signs Assessment: post-procedure vital signs reviewed and stable Respiratory status: spontaneous breathing, nonlabored ventilation, respiratory function stable and patient connected to nasal cannula oxygen Cardiovascular status: stable and blood pressure returned to baseline Postop Assessment: no apparent nausea or vomiting Anesthetic complications: no   No complications documented.  Alisa Graff

## 2020-04-21 NOTE — Anesthesia Procedure Notes (Signed)
Procedure Name: MAC Date/Time: 04/21/2020 9:40 AM Performed by: Jeannene Patella, CRNA Pre-anesthesia Checklist: Patient identified, Emergency Drugs available, Suction available, Timeout performed and Patient being monitored Patient Re-evaluated:Patient Re-evaluated prior to induction Oxygen Delivery Method: Nasal cannula Placement Confirmation: positive ETCO2

## 2020-04-21 NOTE — Transfer of Care (Signed)
Immediate Anesthesia Transfer of Care Note  Patient: Ian Ross  Procedure(s) Performed: CATARACT EXTRACTION PHACO AND INTRAOCULAR LENS PLACEMENT (IOC) RIGHT 5.74 01:01.7 9.3% (Right Eye)  Patient Location: PACU  Anesthesia Type: MAC  Level of Consciousness: awake, alert  and patient cooperative  Airway and Oxygen Therapy: Patient Spontanous Breathing and Patient connected to supplemental oxygen  Post-op Assessment: Post-op Vital signs reviewed, Patient's Cardiovascular Status Stable, Respiratory Function Stable, Patent Airway and No signs of Nausea or vomiting  Post-op Vital Signs: Reviewed and stable  Complications: No complications documented.

## 2020-04-22 ENCOUNTER — Encounter: Payer: Self-pay | Admitting: Ophthalmology

## 2020-04-26 ENCOUNTER — Encounter: Payer: Self-pay | Admitting: Ophthalmology

## 2020-04-27 DIAGNOSIS — H2512 Age-related nuclear cataract, left eye: Secondary | ICD-10-CM | POA: Diagnosis not present

## 2020-05-03 ENCOUNTER — Other Ambulatory Visit
Admission: RE | Admit: 2020-05-03 | Discharge: 2020-05-03 | Disposition: A | Discharge status: Home / Self Care | Payer: PPO | Source: Ambulatory Visit | Attending: Ophthalmology | Admitting: Ophthalmology

## 2020-05-03 ENCOUNTER — Other Ambulatory Visit: Payer: Self-pay

## 2020-05-03 DIAGNOSIS — Z20822 Contact with and (suspected) exposure to covid-19: Secondary | ICD-10-CM | POA: Diagnosis not present

## 2020-05-03 DIAGNOSIS — Z01812 Encounter for preprocedural laboratory examination: Secondary | ICD-10-CM | POA: Diagnosis not present

## 2020-05-03 LAB — SARS CORONAVIRUS 2 (TAT 6-24 HRS): SARS Coronavirus 2: NEGATIVE

## 2020-05-03 NOTE — Discharge Instructions (Signed)

## 2020-05-04 DIAGNOSIS — Z1211 Encounter for screening for malignant neoplasm of colon: Secondary | ICD-10-CM | POA: Diagnosis not present

## 2020-05-05 ENCOUNTER — Encounter
Admission: RE | Disposition: A | Discharge status: Home / Self Care | Payer: Self-pay | Source: Home / Self Care | Attending: Ophthalmology

## 2020-05-05 ENCOUNTER — Encounter: Payer: Self-pay | Admitting: Ophthalmology

## 2020-05-05 ENCOUNTER — Ambulatory Visit: Payer: PPO | Admitting: Anesthesiology

## 2020-05-05 ENCOUNTER — Ambulatory Visit
Admission: RE | Admit: 2020-05-05 | Discharge: 2020-05-05 | Disposition: A | Discharge status: Home / Self Care | Payer: PPO | Attending: Ophthalmology | Admitting: Ophthalmology

## 2020-05-05 ENCOUNTER — Other Ambulatory Visit: Payer: Self-pay

## 2020-05-05 DIAGNOSIS — I10 Essential (primary) hypertension: Secondary | ICD-10-CM | POA: Diagnosis not present

## 2020-05-05 DIAGNOSIS — R413 Other amnesia: Secondary | ICD-10-CM | POA: Diagnosis not present

## 2020-05-05 DIAGNOSIS — H2512 Age-related nuclear cataract, left eye: Secondary | ICD-10-CM | POA: Insufficient documentation

## 2020-05-05 DIAGNOSIS — Z885 Allergy status to narcotic agent status: Secondary | ICD-10-CM | POA: Diagnosis not present

## 2020-05-05 DIAGNOSIS — H25812 Combined forms of age-related cataract, left eye: Secondary | ICD-10-CM | POA: Diagnosis not present

## 2020-05-05 DIAGNOSIS — F17220 Nicotine dependence, chewing tobacco, uncomplicated: Secondary | ICD-10-CM | POA: Insufficient documentation

## 2020-05-05 HISTORY — PX: CATARACT EXTRACTION W/PHACO: SHX586

## 2020-05-05 SURGERY — PHACOEMULSIFICATION, CATARACT, WITH IOL INSERTION
Anesthesia: Monitor Anesthesia Care | Site: Eye | Laterality: Left

## 2020-05-05 MED ORDER — EPINEPHRINE PF 1 MG/ML IJ SOLN
INTRAOCULAR | Status: DC | PRN
  Administered 2020-05-05: 66 mL via OPHTHALMIC

## 2020-05-05 MED ORDER — FENTANYL CITRATE (PF) 100 MCG/2ML IJ SOLN
INTRAMUSCULAR | Status: DC | PRN
  Administered 2020-05-05: 50 ug via INTRAVENOUS

## 2020-05-05 MED ORDER — TETRACAINE HCL 0.5 % OP SOLN
1.0000 [drp] | OPHTHALMIC | Status: DC | PRN
  Administered 2020-05-05 (×3): 1 [drp] via OPHTHALMIC

## 2020-05-05 MED ORDER — LIDOCAINE HCL (PF) 2 % IJ SOLN
INTRAOCULAR | Status: DC | PRN
  Administered 2020-05-05: 1 mL

## 2020-05-05 MED ORDER — MIDAZOLAM HCL 2 MG/2ML IJ SOLN
INTRAMUSCULAR | Status: DC | PRN
  Administered 2020-05-05: 1 mg via INTRAVENOUS

## 2020-05-05 MED ORDER — ARMC OPHTHALMIC DILATING DROPS
1.0000 "application " | OPHTHALMIC | Status: DC | PRN
  Administered 2020-05-05 (×3): 1 via OPHTHALMIC

## 2020-05-05 MED ORDER — LACTATED RINGERS IV SOLN: INTRAVENOUS | Status: DC

## 2020-05-05 MED ORDER — NA HYALUR & NA CHOND-NA HYALUR 0.4-0.35 ML IO KIT
PACK | INTRAOCULAR | Status: DC | PRN
  Administered 2020-05-05: 1 mL via INTRAOCULAR

## 2020-05-05 MED ORDER — BRIMONIDINE TARTRATE-TIMOLOL 0.2-0.5 % OP SOLN
OPHTHALMIC | Status: DC | PRN
  Administered 2020-05-05: 1 [drp] via OPHTHALMIC

## 2020-05-05 MED ORDER — CEFUROXIME OPHTHALMIC INJECTION 1 MG/0.1 ML
INJECTION | OPHTHALMIC | Status: DC | PRN
  Administered 2020-05-05: 0.1 mL via INTRACAMERAL

## 2020-05-05 SURGICAL SUPPLY — 23 items
CANNULA ANT/CHMB 27G (MISCELLANEOUS) ×1 IMPLANT
CANNULA ANT/CHMB 27GA (MISCELLANEOUS) ×2 IMPLANT
GLOVE SURG LX 7.5 STRW (GLOVE) ×1
GLOVE SURG LX STRL 7.5 STRW (GLOVE) ×1 IMPLANT
GLOVE SURG TRIUMPH 8.0 PF LTX (GLOVE) ×2 IMPLANT
GOWN STRL REUS W/ TWL LRG LVL3 (GOWN DISPOSABLE) ×2 IMPLANT
GOWN STRL REUS W/TWL LRG LVL3 (GOWN DISPOSABLE) ×4
LENS IOL DIOP 17.5 (Intraocular Lens) ×2 IMPLANT
LENS IOL TECNIS MONO 17.5 (Intraocular Lens) IMPLANT
MARKER SKIN DUAL TIP RULER LAB (MISCELLANEOUS) ×2 IMPLANT
NDL CAPSULORHEX 25GA (NEEDLE) ×1 IMPLANT
NDL FILTER BLUNT 18X1 1/2 (NEEDLE) ×2 IMPLANT
NEEDLE CAPSULORHEX 25GA (NEEDLE) ×2 IMPLANT
NEEDLE FILTER BLUNT 18X 1/2SAF (NEEDLE) ×2
NEEDLE FILTER BLUNT 18X1 1/2 (NEEDLE) ×2 IMPLANT
PACK CATARACT BRASINGTON (MISCELLANEOUS) ×2 IMPLANT
PACK EYE AFTER SURG (MISCELLANEOUS) ×2 IMPLANT
PACK OPTHALMIC (MISCELLANEOUS) ×2 IMPLANT
SOLUTION OPHTHALMIC SALT (MISCELLANEOUS) ×2 IMPLANT
SYR 3ML LL SCALE MARK (SYRINGE) ×4 IMPLANT
SYR TB 1ML LUER SLIP (SYRINGE) ×2 IMPLANT
WATER STERILE IRR 250ML POUR (IV SOLUTION) ×2 IMPLANT
WIPE NON LINTING 3.25X3.25 (MISCELLANEOUS) ×2 IMPLANT

## 2020-05-05 NOTE — Op Note (Signed)
OPERATIVE NOTE  Ian Ross [id number] 05/05/2020   PREOPERATIVE DIAGNOSIS:  Nuclear sclerotic cataract left eye. H25.12   POSTOPERATIVE DIAGNOSIS:    Nuclear sclerotic cataract left eye.     PROCEDURE:  Phacoemusification with posterior chamber intraocular lens placement of the left eye  Ultrasound time: Procedure(s): CATARACT EXTRACTION PHACO AND INTRAOCULAR LENS PLACEMENT (IOC) LEFT 5.05 01:01.5 802% (Left)  LENS:   Implant Name Type Inv. Item Serial No. Manufacturer Lot No. LRB No. Used Action  LENS IOL DIOP 17.5 - [id number] Intraocular Lens LENS IOL DIOP 17.5 [id number] JOHNSON   Left 1 Implanted      SURGEON:  Wyonia Hough, MD   ANESTHESIA:  Topical with tetracaine drops and 2% Xylocaine jelly, augmented with 1% preservative-free intracameral lidocaine.    COMPLICATIONS:  None.   DESCRIPTION OF PROCEDURE:  The patient was identified in the holding room and transported to the operating room and placed in the supine position under the operating microscope.  The left eye was identified as the operative eye and it was prepped and draped in the usual sterile ophthalmic fashion.   A 1 millimeter clear-corneal paracentesis was made at the 1:30 position.  0.5 ml of preservative-free 1% lidocaine was injected into the anterior chamber.  The anterior chamber was filled with Viscoat viscoelastic.  A 2.4 millimeter keratome was used to make a near-clear corneal incision at the 10:30 position.  .  A curvilinear capsulorrhexis was made with a cystotome and capsulorrhexis forceps.  Balanced salt solution was used to hydrodissect and hydrodelineate the nucleus.   Phacoemulsification was then used in stop and chop fashion to remove the lens nucleus and epinucleus.  The remaining cortex was then removed using the irrigation and aspiration handpiece. Provisc was then placed into the capsular bag to distend it for lens placement.  A lens was then injected into the capsular bag.  The  remaining viscoelastic was aspirated.   Wounds were hydrated with balanced salt solution.  The anterior chamber was inflated to a physiologic pressure with balanced salt solution.  No wound leaks were noted. Cefuroxime 0.1 ml of a 10mg /ml solution was injected into the anterior chamber for a dose of 1 mg of intracameral antibiotic at the completion of the case.   Timolol and Brimonidine drops were applied to the eye.  The patient was taken to the recovery room in stable condition without complications of anesthesia or surgery.  Ian Ross 05/05/2020, 11:32 AM

## 2020-05-05 NOTE — Anesthesia Preprocedure Evaluation (Signed)
Anesthesia Evaluation  Patient identified by MRN, date of birth, ID band Patient awake    Reviewed: Allergy & Precautions, H&P , NPO status , Patient's Chart, lab work & pertinent test results, reviewed documented beta blocker date and time   Airway Mallampati: II  TM Distance: >3 FB Neck ROM: full    Dental no notable dental hx.    Pulmonary Patient abstained from smoking., former smoker,    Pulmonary exam normal breath sounds clear to auscultation       Cardiovascular Exercise Tolerance: Good hypertension, + CAD   Rhythm:regular Rate:Normal     Neuro/Psych Anxiety Depression Dementia negative neurological ROS     GI/Hepatic negative GI ROS, Neg liver ROS,   Endo/Other  negative endocrine ROS  Renal/GU negative Renal ROS  negative genitourinary   Musculoskeletal   Abdominal   Peds  Hematology negative hematology ROS (+)   Anesthesia Other Findings   Reproductive/Obstetrics negative OB ROS                             Anesthesia Physical Anesthesia Plan  ASA: III  Anesthesia Plan: MAC   Post-op Pain Management:    Induction:   PONV Risk Score and Plan: 1 and Treatment may vary due to age or medical condition  Airway Management Planned:   Additional Equipment:   Intra-op Plan:   Post-operative Plan:   Informed Consent: I have reviewed the patients History and Physical, chart, labs and discussed the procedure including the risks, benefits and alternatives for the proposed anesthesia with the patient or authorized representative who has indicated his/her understanding and acceptance.     Dental Advisory Given  Plan Discussed with: CRNA  Anesthesia Plan Comments:         Anesthesia Quick Evaluation

## 2020-05-05 NOTE — Anesthesia Postprocedure Evaluation (Signed)
Anesthesia Post Note  Patient: Ian Ross  Procedure(s) Performed: CATARACT EXTRACTION PHACO AND INTRAOCULAR LENS PLACEMENT (IOC) LEFT 5.05 01:01.5 802% (Left Eye)     Patient location during evaluation: PACU Anesthesia Type: MAC Level of consciousness: awake and alert Pain management: pain level controlled Vital Signs Assessment: post-procedure vital signs reviewed and stable Respiratory status: spontaneous breathing, nonlabored ventilation, respiratory function stable and patient connected to nasal cannula oxygen Cardiovascular status: stable and blood pressure returned to baseline Postop Assessment: no apparent nausea or vomiting Anesthetic complications: no   No complications documented.  Alisa Graff

## 2020-05-05 NOTE — H&P (Signed)

## 2020-05-05 NOTE — Transfer of Care (Signed)
Immediate Anesthesia Transfer of Care Note  Patient: Ian Ross  Procedure(s) Performed: CATARACT EXTRACTION PHACO AND INTRAOCULAR LENS PLACEMENT (IOC) LEFT 5.05 01:01.5 802% (Left Eye)  Patient Location: PACU  Anesthesia Type: MAC  Level of Consciousness: awake, alert  and patient cooperative  Airway and Oxygen Therapy: Patient Spontanous Breathing and Patient connected to supplemental oxygen  Post-op Assessment: Post-op Vital signs reviewed, Patient's Cardiovascular Status Stable, Respiratory Function Stable, Patent Airway and No signs of Nausea or vomiting  Post-op Vital Signs: Reviewed and stable  Complications: No complications documented.

## 2020-05-05 NOTE — Anesthesia Procedure Notes (Signed)
Procedure Name: MAC Performed by: Kaytlen Lightsey, CRNA Pre-anesthesia Checklist: Patient identified, Emergency Drugs available, Suction available, Timeout performed and Patient being monitored Patient Re-evaluated:Patient Re-evaluated prior to induction Oxygen Delivery Method: Nasal cannula Placement Confirmation: positive ETCO2       

## 2020-05-06 ENCOUNTER — Encounter: Payer: Self-pay | Admitting: Ophthalmology

## 2020-05-12 LAB — COLOGUARD: COLOGUARD: NEGATIVE

## 2020-06-01 ENCOUNTER — Telehealth: Payer: Self-pay | Admitting: *Deleted

## 2020-06-01 NOTE — Telephone Encounter (Signed)
Attempted to contact patient for lung screening no answer and no answering machine.

## 2020-06-04 ENCOUNTER — Telehealth: Payer: Self-pay | Admitting: *Deleted

## 2020-06-04 NOTE — Telephone Encounter (Signed)
Patient scheduled for LCS on 06/14/20 at 11:00

## 2020-06-07 ENCOUNTER — Other Ambulatory Visit: Payer: Self-pay | Admitting: *Deleted

## 2020-06-07 DIAGNOSIS — F17200 Nicotine dependence, unspecified, uncomplicated: Secondary | ICD-10-CM

## 2020-06-07 DIAGNOSIS — Z122 Encounter for screening for malignant neoplasm of respiratory organs: Secondary | ICD-10-CM

## 2020-06-07 DIAGNOSIS — Z87891 Personal history of nicotine dependence: Secondary | ICD-10-CM

## 2020-06-07 NOTE — Progress Notes (Signed)
Contacted and scheduled for annual lung screening scan. Patient is a current smoker with a 61.75 pack year history.

## 2020-06-09 IMAGING — CT CT CHEST LUNG CANCER SCREENING LOW DOSE W/O CM
2 of 11 series · 5 of 40 positions shown, 6 images · non-contrast
Comparison: 05/29/2017

CLINICAL DATA: Current smoker with 61 pack-year history.
Asymptomatic.

EXAM:
CT CHEST WITHOUT CONTRAST LOW-DOSE FOR LUNG CANCER SCREENING
TECHNIQUE: Multidetector CT imaging of the chest was performed following the
standard protocol without IV contrast.

[Series 4: lung · axial · 0.75mm/px · z∈[-1270,-1249]mm · 2 of 343 slices shown, 3 images (1 of 2)]
[im 22/343  mediastinal]
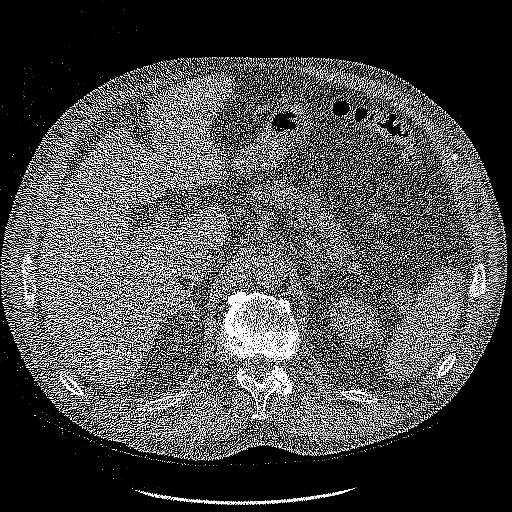
[im 22/343  lung]
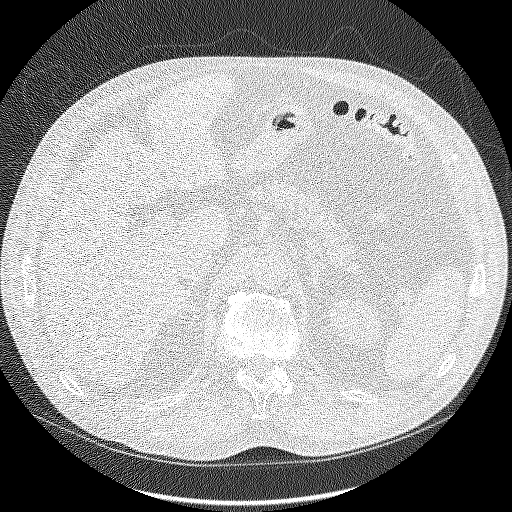
[im 43/343  lung]
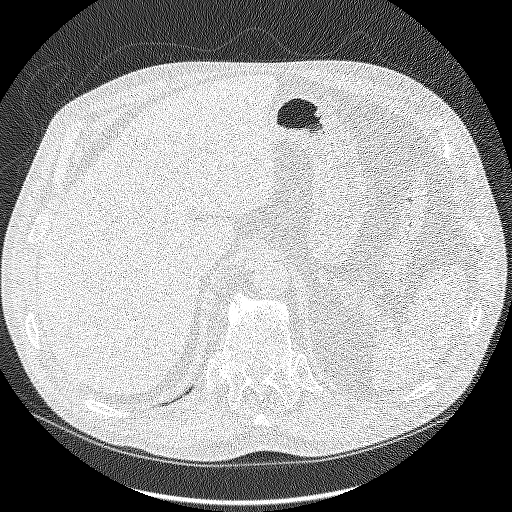

[Series 5: lung · coronal · 0.67mm/px · 3 of 308 slices shown (2 of 2)]
[im 62/308  lung]
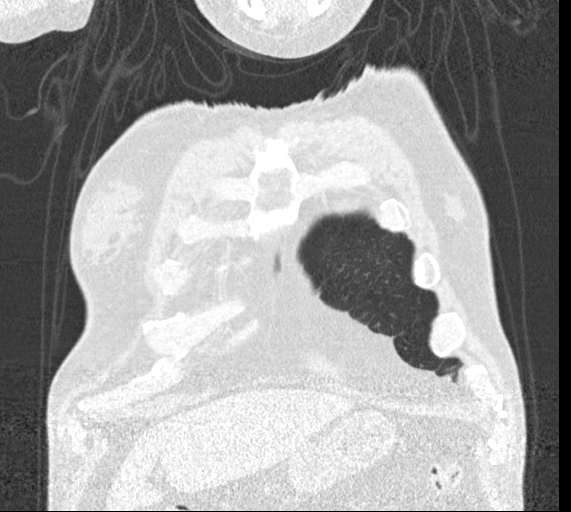
[im 123/308  lung]
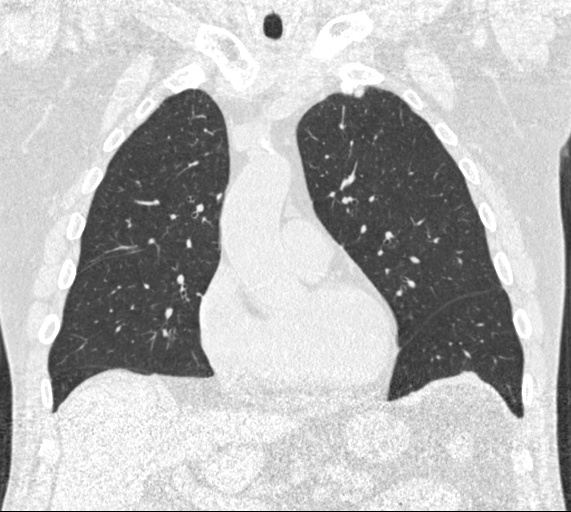
[im 185/308  lung]
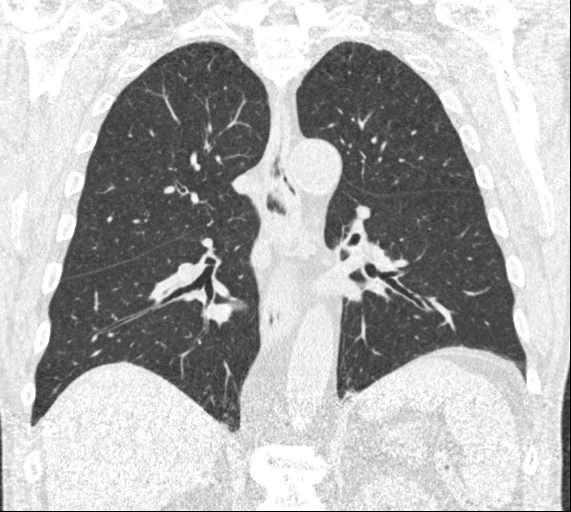

[5 of 40 positions shown; findings below may reference images not displayed]

FINDINGS: Cardiovascular: Aortic and branch vessel atherosclerosis. Normal
heart size with increase in mild pericardial effusion versus
thickening. Multivessel coronary artery atherosclerosis.

Mediastinum/Nodes: No mediastinal or definite hilar adenopathy,
given limitations of unenhanced CT.

Lungs/Pleura: No pleural fluid. Mild centrilobular emphysema.
Calcified and noncalcified pulmonary nodules are tiny and similar.
No enlarging or suspicious pulmonary nodule identified.

Upper Abdomen: Normal imaged portions of the liver, gallbladder,
stomach, pancreas, adrenal glands, kidneys. Splenic calcifications
are similar and may be post infectious or traumatic.

Musculoskeletal: Soft tissue fullness at the right breast including
image [DATE], new. Advanced mid and lower thoracic spondylosis.
IMPRESSION: 1. Lung-RADS Category 2, benign appearance or behavior. Continue
annual screening with low-dose chest CT without contrast in 12
months.
2. Coronary artery atherosclerosis.
3. New soft tissue fullness at the right breast. This was evaluated
with mammogram on 05/16/2018. Please see that report.

Aortic Atherosclerosis ([id number]).

## 2020-06-14 ENCOUNTER — Other Ambulatory Visit: Payer: Self-pay

## 2020-06-14 ENCOUNTER — Ambulatory Visit
Admission: RE | Admit: 2020-06-14 | Discharge: 2020-06-14 | Disposition: A | Discharge status: Home / Self Care | Payer: PPO | Source: Ambulatory Visit | Attending: Oncology | Admitting: Oncology

## 2020-06-14 DIAGNOSIS — F17200 Nicotine dependence, unspecified, uncomplicated: Secondary | ICD-10-CM | POA: Diagnosis not present

## 2020-06-14 DIAGNOSIS — Z122 Encounter for screening for malignant neoplasm of respiratory organs: Secondary | ICD-10-CM | POA: Diagnosis not present

## 2020-06-14 DIAGNOSIS — Z87891 Personal history of nicotine dependence: Secondary | ICD-10-CM | POA: Insufficient documentation

## 2020-06-14 DIAGNOSIS — F17210 Nicotine dependence, cigarettes, uncomplicated: Secondary | ICD-10-CM | POA: Diagnosis not present

## 2020-06-21 ENCOUNTER — Encounter: Payer: Self-pay | Admitting: *Deleted

## 2020-08-02 DIAGNOSIS — E782 Mixed hyperlipidemia: Secondary | ICD-10-CM | POA: Diagnosis not present

## 2020-08-02 DIAGNOSIS — I10 Essential (primary) hypertension: Secondary | ICD-10-CM | POA: Diagnosis not present

## 2020-08-02 DIAGNOSIS — M109 Gout, unspecified: Secondary | ICD-10-CM | POA: Diagnosis not present

## 2020-08-02 DIAGNOSIS — Z0000 Encounter for general adult medical examination without abnormal findings: Secondary | ICD-10-CM | POA: Diagnosis not present

## 2020-08-09 DIAGNOSIS — F325 Major depressive disorder, single episode, in full remission: Secondary | ICD-10-CM | POA: Diagnosis not present

## 2020-08-09 DIAGNOSIS — C61 Malignant neoplasm of prostate: Secondary | ICD-10-CM | POA: Diagnosis not present

## 2020-08-09 DIAGNOSIS — I10 Essential (primary) hypertension: Secondary | ICD-10-CM | POA: Diagnosis not present

## 2020-08-09 DIAGNOSIS — F5101 Primary insomnia: Secondary | ICD-10-CM | POA: Diagnosis not present

## 2020-08-09 DIAGNOSIS — E782 Mixed hyperlipidemia: Secondary | ICD-10-CM | POA: Diagnosis not present

## 2020-08-09 DIAGNOSIS — Z0000 Encounter for general adult medical examination without abnormal findings: Secondary | ICD-10-CM | POA: Diagnosis not present

## 2020-08-09 DIAGNOSIS — I2510 Atherosclerotic heart disease of native coronary artery without angina pectoris: Secondary | ICD-10-CM | POA: Diagnosis not present

## 2020-08-09 DIAGNOSIS — F17200 Nicotine dependence, unspecified, uncomplicated: Secondary | ICD-10-CM | POA: Diagnosis not present

## 2020-08-09 DIAGNOSIS — M1A00X0 Idiopathic chronic gout, unspecified site, without tophus (tophi): Secondary | ICD-10-CM | POA: Diagnosis not present

## 2020-08-19 DIAGNOSIS — Z6823 Body mass index (BMI) 23.0-23.9, adult: Secondary | ICD-10-CM | POA: Diagnosis not present

## 2020-08-19 DIAGNOSIS — M1A09X1 Idiopathic chronic gout, multiple sites, with tophus (tophi): Secondary | ICD-10-CM | POA: Diagnosis not present

## 2020-08-19 DIAGNOSIS — M2550 Pain in unspecified joint: Secondary | ICD-10-CM | POA: Diagnosis not present

## 2020-08-19 DIAGNOSIS — R208 Other disturbances of skin sensation: Secondary | ICD-10-CM | POA: Diagnosis not present

## 2020-09-06 DIAGNOSIS — D040 Carcinoma in situ of skin of lip: Secondary | ICD-10-CM | POA: Diagnosis not present

## 2020-09-06 DIAGNOSIS — D485 Neoplasm of uncertain behavior of skin: Secondary | ICD-10-CM | POA: Diagnosis not present

## 2020-10-15 DIAGNOSIS — C4402 Squamous cell carcinoma of skin of lip: Secondary | ICD-10-CM | POA: Diagnosis not present

## 2020-10-15 DIAGNOSIS — D040 Carcinoma in situ of skin of lip: Secondary | ICD-10-CM | POA: Diagnosis not present

## 2020-12-23 DIAGNOSIS — H353131 Nonexudative age-related macular degeneration, bilateral, early dry stage: Secondary | ICD-10-CM | POA: Diagnosis not present

## 2021-02-09 DIAGNOSIS — I10 Essential (primary) hypertension: Secondary | ICD-10-CM | POA: Diagnosis not present

## 2021-02-09 DIAGNOSIS — E782 Mixed hyperlipidemia: Secondary | ICD-10-CM | POA: Diagnosis not present

## 2021-02-09 DIAGNOSIS — Z8546 Personal history of malignant neoplasm of prostate: Secondary | ICD-10-CM | POA: Diagnosis not present

## 2021-02-09 DIAGNOSIS — Z125 Encounter for screening for malignant neoplasm of prostate: Secondary | ICD-10-CM | POA: Diagnosis not present

## 2021-02-16 DIAGNOSIS — F325 Major depressive disorder, single episode, in full remission: Secondary | ICD-10-CM | POA: Diagnosis not present

## 2021-02-16 DIAGNOSIS — M1A00X0 Idiopathic chronic gout, unspecified site, without tophus (tophi): Secondary | ICD-10-CM | POA: Diagnosis not present

## 2021-02-16 DIAGNOSIS — I2510 Atherosclerotic heart disease of native coronary artery without angina pectoris: Secondary | ICD-10-CM | POA: Diagnosis not present

## 2021-02-16 DIAGNOSIS — I10 Essential (primary) hypertension: Secondary | ICD-10-CM | POA: Diagnosis not present

## 2021-02-16 DIAGNOSIS — E782 Mixed hyperlipidemia: Secondary | ICD-10-CM | POA: Diagnosis not present

## 2021-02-16 DIAGNOSIS — Z0000 Encounter for general adult medical examination without abnormal findings: Secondary | ICD-10-CM | POA: Diagnosis not present

## 2021-02-16 DIAGNOSIS — F5101 Primary insomnia: Secondary | ICD-10-CM | POA: Diagnosis not present

## 2021-02-16 DIAGNOSIS — F17200 Nicotine dependence, unspecified, uncomplicated: Secondary | ICD-10-CM | POA: Diagnosis not present

## 2021-02-16 DIAGNOSIS — C61 Malignant neoplasm of prostate: Secondary | ICD-10-CM | POA: Diagnosis not present

## 2021-04-05 ENCOUNTER — Other Ambulatory Visit: Payer: Self-pay

## 2021-04-05 ENCOUNTER — Ambulatory Visit: Payer: PPO | Admitting: Podiatry

## 2021-04-05 ENCOUNTER — Encounter: Payer: Self-pay | Admitting: Podiatry

## 2021-04-05 DIAGNOSIS — G629 Polyneuropathy, unspecified: Secondary | ICD-10-CM

## 2021-04-05 MED ORDER — GABAPENTIN 100 MG PO CAPS: 100.0000 mg | ORAL_CAPSULE | Freq: Three times a day (TID) | ORAL | 1 refills | Status: DC

## 2021-04-16 NOTE — Progress Notes (Signed)
° °  HPI: 76 y.o. male presenting today for evaluation of stinging to the bilateral feet.  Patient states that the bilateral feet staying on a daily basis.  This is been ongoing for the past year and a half.  He does have a history of peripheral vascular disease.  This was a gradual onset.  He has not anything for treatment.  He does smoke half a pack a day.  Patient denies a history of injury.  He presents for further treatment and evaluation  Past Medical History:  Diagnosis Date   Anxiety    Arthritis    Chicken pox    Dementia ([place])    Depression    Gout    Hypertension    Personal history of tobacco use, presenting hazards to health 08/06/2015   Prostate cancer [facility])    Testicular cyst    Dr. Jacqlyn Larsen    Past Surgical History:  Procedure Laterality Date   CATARACT EXTRACTION W/PHACO Right 04/21/2020   Procedure: CATARACT EXTRACTION PHACO AND INTRAOCULAR LENS PLACEMENT (IOC) RIGHT 5.74 01:01.7 9.3%;  Surgeon: Leandrew Koyanagi, MD;  Location: [place];  Service: Ophthalmology;  Laterality: Right;   CATARACT EXTRACTION W/PHACO Left 05/05/2020   Procedure: CATARACT EXTRACTION PHACO AND INTRAOCULAR LENS PLACEMENT (IOC) LEFT 5.05 01:01.5 802%;  Surgeon: Leandrew Koyanagi, MD;  Location: [place];  Service: Ophthalmology;  Laterality: Left;   GANGLION CYST EXCISION Right 10/08/2015   Procedure: REMOVAL GANGLION CYST FOOT/2 right foot;  Surgeon: Sharlotte Alamo, DPM;  Location: [facility];  Service: Podiatry;  Laterality: Right;   KNEE ARTHROSCOPY Right    Dr. Tamala Julian, [place] Left 04/18/2019   Procedure: LOWER EXTREMITY ANGIOGRAPHY;  Surgeon: Katha Cabal, MD;  Location: [place] [facility];  Service: Cardiovascular;  Laterality: Left;   NASAL SINUS SURGERY     Dr. Nadeen Landau, [facility]    Allergies  Allergen Reactions   Codeine Nausea Only, Anxiety and Other (See Comments)     Physical Exam: General: The patient is alert and oriented x3 in  no acute distress.  Dermatology: Skin is warm, dry and supple bilateral lower extremities. Negative for open lesions or macerations.  Vascular: No history of peripheral vascular disease.  Followed by [facility]  Neurological: Light touch and protective threshold grossly intact.  Patient does experience paresthesia with burning and stinging sensation to the bilateral feet  Musculoskeletal Exam: No pedal deformities noted   Assessment: 1.  Peripheral polyneuropathy bilateral feet 2.  Peripheral vascular disease bilateral   Plan of Care:  1. Patient evaluated.  2.  Continue management with [facility] 3.  Prescription for gabapentin 100 mg 3 times daily #90 4.  Recommend follow-up with his PCP 5.  Return to clinic as needed      Edrick Kins, DPM [facility]  Dr. Edrick Kins, DPM    [address], [place] [address]                Office [phone]  Fax [phone]

## 2021-05-29 ENCOUNTER — Other Ambulatory Visit: Payer: Self-pay | Admitting: Podiatry

## 2021-07-11 ENCOUNTER — Other Ambulatory Visit: Payer: Self-pay | Admitting: *Deleted

## 2021-07-11 DIAGNOSIS — F17210 Nicotine dependence, cigarettes, uncomplicated: Secondary | ICD-10-CM

## 2021-07-11 DIAGNOSIS — Z122 Encounter for screening for malignant neoplasm of respiratory organs: Secondary | ICD-10-CM

## 2021-07-11 DIAGNOSIS — Z87891 Personal history of nicotine dependence: Secondary | ICD-10-CM

## 2021-07-19 ENCOUNTER — Ambulatory Visit
Admission: RE | Admit: 2021-07-19 | Discharge: 2021-07-19 | Disposition: A | Discharge status: Home / Self Care | Payer: PPO | Source: Ambulatory Visit | Attending: Acute Care | Admitting: Acute Care

## 2021-07-19 DIAGNOSIS — Z122 Encounter for screening for malignant neoplasm of respiratory organs: Secondary | ICD-10-CM | POA: Insufficient documentation

## 2021-07-19 DIAGNOSIS — Z87891 Personal history of nicotine dependence: Secondary | ICD-10-CM | POA: Insufficient documentation

## 2021-07-19 DIAGNOSIS — F17210 Nicotine dependence, cigarettes, uncomplicated: Secondary | ICD-10-CM | POA: Diagnosis not present

## 2021-07-19 DIAGNOSIS — I700 Atherosclerosis of aorta: Secondary | ICD-10-CM | POA: Insufficient documentation

## 2021-07-19 DIAGNOSIS — J432 Centrilobular emphysema: Secondary | ICD-10-CM | POA: Insufficient documentation

## 2021-07-19 DIAGNOSIS — I2510 Atherosclerotic heart disease of native coronary artery without angina pectoris: Secondary | ICD-10-CM | POA: Insufficient documentation

## 2021-07-22 ENCOUNTER — Other Ambulatory Visit: Payer: Self-pay

## 2021-07-22 DIAGNOSIS — Z122 Encounter for screening for malignant neoplasm of respiratory organs: Secondary | ICD-10-CM

## 2021-07-22 DIAGNOSIS — F17210 Nicotine dependence, cigarettes, uncomplicated: Secondary | ICD-10-CM

## 2021-07-22 DIAGNOSIS — Z87891 Personal history of nicotine dependence: Secondary | ICD-10-CM

## 2021-07-25 ENCOUNTER — Other Ambulatory Visit: Payer: Self-pay | Admitting: Podiatry

## 2021-07-25 NOTE — Telephone Encounter (Signed)
Please advise 

## 2021-08-09 DIAGNOSIS — E782 Mixed hyperlipidemia: Secondary | ICD-10-CM | POA: Diagnosis not present

## 2021-08-09 DIAGNOSIS — I10 Essential (primary) hypertension: Secondary | ICD-10-CM | POA: Diagnosis not present

## 2021-08-09 DIAGNOSIS — M1A00X0 Idiopathic chronic gout, unspecified site, without tophus (tophi): Secondary | ICD-10-CM | POA: Diagnosis not present

## 2021-08-16 DIAGNOSIS — N62 Hypertrophy of breast: Secondary | ICD-10-CM | POA: Diagnosis not present

## 2021-08-16 DIAGNOSIS — F5101 Primary insomnia: Secondary | ICD-10-CM | POA: Diagnosis not present

## 2021-08-16 DIAGNOSIS — G629 Polyneuropathy, unspecified: Secondary | ICD-10-CM | POA: Diagnosis not present

## 2021-08-16 DIAGNOSIS — E782 Mixed hyperlipidemia: Secondary | ICD-10-CM | POA: Diagnosis not present

## 2021-08-16 DIAGNOSIS — I10 Essential (primary) hypertension: Secondary | ICD-10-CM | POA: Diagnosis not present

## 2021-08-16 DIAGNOSIS — Z0000 Encounter for general adult medical examination without abnormal findings: Secondary | ICD-10-CM | POA: Diagnosis not present

## 2021-08-16 DIAGNOSIS — M1A00X0 Idiopathic chronic gout, unspecified site, without tophus (tophi): Secondary | ICD-10-CM | POA: Diagnosis not present

## 2021-08-16 DIAGNOSIS — C61 Malignant neoplasm of prostate: Secondary | ICD-10-CM | POA: Diagnosis not present

## 2021-08-16 DIAGNOSIS — F325 Major depressive disorder, single episode, in full remission: Secondary | ICD-10-CM | POA: Diagnosis not present

## 2021-08-16 DIAGNOSIS — F17200 Nicotine dependence, unspecified, uncomplicated: Secondary | ICD-10-CM | POA: Diagnosis not present

## 2021-08-16 DIAGNOSIS — Z720 Tobacco use: Secondary | ICD-10-CM | POA: Diagnosis not present

## 2021-10-17 DIAGNOSIS — M1711 Unilateral primary osteoarthritis, right knee: Secondary | ICD-10-CM | POA: Diagnosis not present

## 2021-10-17 DIAGNOSIS — M48062 Spinal stenosis, lumbar region with neurogenic claudication: Secondary | ICD-10-CM | POA: Diagnosis not present

## 2021-10-17 DIAGNOSIS — M5416 Radiculopathy, lumbar region: Secondary | ICD-10-CM | POA: Diagnosis not present

## 2022-01-26 DIAGNOSIS — H353131 Nonexudative age-related macular degeneration, bilateral, early dry stage: Secondary | ICD-10-CM | POA: Diagnosis not present

## 2022-01-30 ENCOUNTER — Encounter (INDEPENDENT_AMBULATORY_CARE_PROVIDER_SITE_OTHER): Payer: Self-pay

## 2022-02-13 DIAGNOSIS — Z79899 Other long term (current) drug therapy: Secondary | ICD-10-CM | POA: Diagnosis not present

## 2022-02-13 DIAGNOSIS — F5101 Primary insomnia: Secondary | ICD-10-CM | POA: Diagnosis not present

## 2022-02-13 DIAGNOSIS — F325 Major depressive disorder, single episode, in full remission: Secondary | ICD-10-CM | POA: Diagnosis not present

## 2022-02-13 DIAGNOSIS — E782 Mixed hyperlipidemia: Secondary | ICD-10-CM | POA: Diagnosis not present

## 2022-02-13 DIAGNOSIS — G629 Polyneuropathy, unspecified: Secondary | ICD-10-CM | POA: Diagnosis not present

## 2022-02-13 DIAGNOSIS — I10 Essential (primary) hypertension: Secondary | ICD-10-CM | POA: Diagnosis not present

## 2022-02-20 DIAGNOSIS — I2510 Atherosclerotic heart disease of native coronary artery without angina pectoris: Secondary | ICD-10-CM | POA: Diagnosis not present

## 2022-02-20 DIAGNOSIS — M1A00X0 Idiopathic chronic gout, unspecified site, without tophus (tophi): Secondary | ICD-10-CM | POA: Diagnosis not present

## 2022-02-20 DIAGNOSIS — F325 Major depressive disorder, single episode, in full remission: Secondary | ICD-10-CM | POA: Diagnosis not present

## 2022-02-20 DIAGNOSIS — Z720 Tobacco use: Secondary | ICD-10-CM | POA: Diagnosis not present

## 2022-02-20 DIAGNOSIS — I10 Essential (primary) hypertension: Secondary | ICD-10-CM | POA: Diagnosis not present

## 2022-02-20 DIAGNOSIS — Z0000 Encounter for general adult medical examination without abnormal findings: Secondary | ICD-10-CM | POA: Diagnosis not present

## 2022-02-20 DIAGNOSIS — Z131 Encounter for screening for diabetes mellitus: Secondary | ICD-10-CM | POA: Diagnosis not present

## 2022-02-20 DIAGNOSIS — C61 Malignant neoplasm of prostate: Secondary | ICD-10-CM | POA: Diagnosis not present

## 2022-02-20 DIAGNOSIS — E782 Mixed hyperlipidemia: Secondary | ICD-10-CM | POA: Diagnosis not present

## 2022-02-20 DIAGNOSIS — F5101 Primary insomnia: Secondary | ICD-10-CM | POA: Diagnosis not present

## 2022-04-11 DIAGNOSIS — J0110 Acute frontal sinusitis, unspecified: Secondary | ICD-10-CM | POA: Diagnosis not present

## 2022-04-21 ENCOUNTER — Ambulatory Visit: Payer: PPO | Admitting: Podiatry

## 2022-04-24 DIAGNOSIS — M25572 Pain in left ankle and joints of left foot: Secondary | ICD-10-CM | POA: Diagnosis not present

## 2022-04-24 DIAGNOSIS — L600 Ingrowing nail: Secondary | ICD-10-CM | POA: Diagnosis not present

## 2022-04-24 DIAGNOSIS — R234 Changes in skin texture: Secondary | ICD-10-CM | POA: Diagnosis not present

## 2022-04-24 DIAGNOSIS — B351 Tinea unguium: Secondary | ICD-10-CM | POA: Diagnosis not present

## 2022-04-24 DIAGNOSIS — L97521 Non-pressure chronic ulcer of other part of left foot limited to breakdown of skin: Secondary | ICD-10-CM | POA: Diagnosis not present

## 2022-04-24 DIAGNOSIS — M67472 Ganglion, left ankle and foot: Secondary | ICD-10-CM | POA: Diagnosis not present

## 2022-05-01 DIAGNOSIS — G8929 Other chronic pain: Secondary | ICD-10-CM | POA: Diagnosis not present

## 2022-05-01 DIAGNOSIS — Z87891 Personal history of nicotine dependence: Secondary | ICD-10-CM | POA: Diagnosis not present

## 2022-05-01 DIAGNOSIS — I739 Peripheral vascular disease, unspecified: Secondary | ICD-10-CM | POA: Diagnosis not present

## 2022-05-01 DIAGNOSIS — G629 Polyneuropathy, unspecified: Secondary | ICD-10-CM | POA: Diagnosis not present

## 2022-05-01 DIAGNOSIS — M545 Low back pain, unspecified: Secondary | ICD-10-CM | POA: Diagnosis not present

## 2022-05-01 DIAGNOSIS — M25572 Pain in left ankle and joints of left foot: Secondary | ICD-10-CM | POA: Diagnosis not present

## 2022-05-01 DIAGNOSIS — B351 Tinea unguium: Secondary | ICD-10-CM | POA: Diagnosis not present

## 2022-05-01 DIAGNOSIS — M67472 Ganglion, left ankle and foot: Secondary | ICD-10-CM | POA: Diagnosis not present

## 2022-07-21 ENCOUNTER — Ambulatory Visit
Admission: RE | Admit: 2022-07-21 | Discharge: 2022-07-21 | Disposition: A | Discharge status: Home / Self Care | Payer: PPO | Source: Ambulatory Visit | Attending: Physician Assistant | Admitting: Physician Assistant

## 2022-07-21 DIAGNOSIS — Z122 Encounter for screening for malignant neoplasm of respiratory organs: Secondary | ICD-10-CM | POA: Insufficient documentation

## 2022-07-21 DIAGNOSIS — F17210 Nicotine dependence, cigarettes, uncomplicated: Secondary | ICD-10-CM | POA: Diagnosis not present

## 2022-07-21 DIAGNOSIS — M47814 Spondylosis without myelopathy or radiculopathy, thoracic region: Secondary | ICD-10-CM | POA: Diagnosis not present

## 2022-07-21 DIAGNOSIS — N62 Hypertrophy of breast: Secondary | ICD-10-CM | POA: Diagnosis not present

## 2022-07-21 DIAGNOSIS — I3139 Other pericardial effusion (noninflammatory): Secondary | ICD-10-CM | POA: Diagnosis not present

## 2022-07-21 DIAGNOSIS — I700 Atherosclerosis of aorta: Secondary | ICD-10-CM | POA: Diagnosis not present

## 2022-07-21 DIAGNOSIS — D7389 Other diseases of spleen: Secondary | ICD-10-CM | POA: Insufficient documentation

## 2022-07-21 DIAGNOSIS — Z87891 Personal history of nicotine dependence: Secondary | ICD-10-CM

## 2022-07-21 DIAGNOSIS — J439 Emphysema, unspecified: Secondary | ICD-10-CM | POA: Insufficient documentation

## 2022-07-21 DIAGNOSIS — I2510 Atherosclerotic heart disease of native coronary artery without angina pectoris: Secondary | ICD-10-CM | POA: Insufficient documentation

## 2022-08-14 DIAGNOSIS — E782 Mixed hyperlipidemia: Secondary | ICD-10-CM | POA: Diagnosis not present

## 2022-08-14 DIAGNOSIS — C61 Malignant neoplasm of prostate: Secondary | ICD-10-CM | POA: Diagnosis not present

## 2022-08-14 DIAGNOSIS — Z131 Encounter for screening for diabetes mellitus: Secondary | ICD-10-CM | POA: Diagnosis not present

## 2022-08-14 DIAGNOSIS — I10 Essential (primary) hypertension: Secondary | ICD-10-CM | POA: Diagnosis not present

## 2022-08-14 DIAGNOSIS — M1A00X0 Idiopathic chronic gout, unspecified site, without tophus (tophi): Secondary | ICD-10-CM | POA: Diagnosis not present

## 2022-08-21 DIAGNOSIS — D696 Thrombocytopenia, unspecified: Secondary | ICD-10-CM | POA: Diagnosis not present

## 2022-08-21 DIAGNOSIS — C61 Malignant neoplasm of prostate: Secondary | ICD-10-CM | POA: Diagnosis not present

## 2022-08-21 DIAGNOSIS — G629 Polyneuropathy, unspecified: Secondary | ICD-10-CM | POA: Diagnosis not present

## 2022-08-21 DIAGNOSIS — E782 Mixed hyperlipidemia: Secondary | ICD-10-CM | POA: Diagnosis not present

## 2022-08-21 DIAGNOSIS — F5101 Primary insomnia: Secondary | ICD-10-CM | POA: Diagnosis not present

## 2022-08-21 DIAGNOSIS — M1A00X0 Idiopathic chronic gout, unspecified site, without tophus (tophi): Secondary | ICD-10-CM | POA: Diagnosis not present

## 2022-08-21 DIAGNOSIS — F325 Major depressive disorder, single episode, in full remission: Secondary | ICD-10-CM | POA: Diagnosis not present

## 2022-08-21 DIAGNOSIS — N62 Hypertrophy of breast: Secondary | ICD-10-CM | POA: Diagnosis not present

## 2022-08-21 DIAGNOSIS — Z0000 Encounter for general adult medical examination without abnormal findings: Secondary | ICD-10-CM | POA: Diagnosis not present

## 2022-08-21 DIAGNOSIS — I10 Essential (primary) hypertension: Secondary | ICD-10-CM | POA: Diagnosis not present

## 2022-11-14 DIAGNOSIS — D696 Thrombocytopenia, unspecified: Secondary | ICD-10-CM | POA: Diagnosis not present

## 2022-11-14 DIAGNOSIS — I10 Essential (primary) hypertension: Secondary | ICD-10-CM | POA: Diagnosis not present

## 2022-11-14 DIAGNOSIS — E782 Mixed hyperlipidemia: Secondary | ICD-10-CM | POA: Diagnosis not present

## 2022-11-21 DIAGNOSIS — M545 Low back pain, unspecified: Secondary | ICD-10-CM | POA: Diagnosis not present

## 2022-11-21 DIAGNOSIS — E538 Deficiency of other specified B group vitamins: Secondary | ICD-10-CM | POA: Diagnosis not present

## 2022-11-21 DIAGNOSIS — F325 Major depressive disorder, single episode, in full remission: Secondary | ICD-10-CM | POA: Diagnosis not present

## 2022-11-21 DIAGNOSIS — Z131 Encounter for screening for diabetes mellitus: Secondary | ICD-10-CM | POA: Diagnosis not present

## 2022-11-21 DIAGNOSIS — C61 Malignant neoplasm of prostate: Secondary | ICD-10-CM | POA: Diagnosis not present

## 2022-11-21 DIAGNOSIS — R7989 Other specified abnormal findings of blood chemistry: Secondary | ICD-10-CM | POA: Diagnosis not present

## 2022-11-21 DIAGNOSIS — E782 Mixed hyperlipidemia: Secondary | ICD-10-CM | POA: Diagnosis not present

## 2022-11-21 DIAGNOSIS — F5101 Primary insomnia: Secondary | ICD-10-CM | POA: Diagnosis not present

## 2022-11-21 DIAGNOSIS — M1711 Unilateral primary osteoarthritis, right knee: Secondary | ICD-10-CM | POA: Diagnosis not present

## 2022-11-21 DIAGNOSIS — M1A00X0 Idiopathic chronic gout, unspecified site, without tophus (tophi): Secondary | ICD-10-CM | POA: Diagnosis not present

## 2022-11-21 DIAGNOSIS — M47816 Spondylosis without myelopathy or radiculopathy, lumbar region: Secondary | ICD-10-CM | POA: Diagnosis not present

## 2022-11-21 DIAGNOSIS — Z720 Tobacco use: Secondary | ICD-10-CM | POA: Diagnosis not present

## 2022-11-21 DIAGNOSIS — M7918 Myalgia, other site: Secondary | ICD-10-CM | POA: Diagnosis not present

## 2022-11-21 DIAGNOSIS — M5186 Other intervertebral disc disorders, lumbar region: Secondary | ICD-10-CM | POA: Diagnosis not present

## 2022-11-21 DIAGNOSIS — M48062 Spinal stenosis, lumbar region with neurogenic claudication: Secondary | ICD-10-CM | POA: Diagnosis not present

## 2022-11-21 DIAGNOSIS — M4316 Spondylolisthesis, lumbar region: Secondary | ICD-10-CM | POA: Diagnosis not present

## 2022-11-21 DIAGNOSIS — M5416 Radiculopathy, lumbar region: Secondary | ICD-10-CM | POA: Diagnosis not present

## 2022-11-21 DIAGNOSIS — I10 Essential (primary) hypertension: Secondary | ICD-10-CM | POA: Diagnosis not present

## 2022-11-22 ENCOUNTER — Other Ambulatory Visit: Payer: Self-pay | Admitting: Physician Assistant

## 2022-11-22 DIAGNOSIS — R7989 Other specified abnormal findings of blood chemistry: Secondary | ICD-10-CM

## 2022-11-23 ENCOUNTER — Ambulatory Visit
Admission: RE | Admit: 2022-11-23 | Discharge: 2022-11-23 | Disposition: A | Discharge status: Home / Self Care | Payer: PPO | Source: Ambulatory Visit | Attending: Physician Assistant | Admitting: Physician Assistant

## 2022-11-23 DIAGNOSIS — R7989 Other specified abnormal findings of blood chemistry: Secondary | ICD-10-CM | POA: Diagnosis not present

## 2022-11-28 DIAGNOSIS — M48062 Spinal stenosis, lumbar region with neurogenic claudication: Secondary | ICD-10-CM | POA: Diagnosis not present

## 2022-11-28 DIAGNOSIS — M5416 Radiculopathy, lumbar region: Secondary | ICD-10-CM | POA: Diagnosis not present

## 2022-12-19 DIAGNOSIS — M48062 Spinal stenosis, lumbar region with neurogenic claudication: Secondary | ICD-10-CM | POA: Diagnosis not present

## 2022-12-19 DIAGNOSIS — M5416 Radiculopathy, lumbar region: Secondary | ICD-10-CM | POA: Diagnosis not present

## 2023-01-29 DIAGNOSIS — H40003 Preglaucoma, unspecified, bilateral: Secondary | ICD-10-CM | POA: Diagnosis not present

## 2023-01-29 DIAGNOSIS — H353131 Nonexudative age-related macular degeneration, bilateral, early dry stage: Secondary | ICD-10-CM | POA: Diagnosis not present

## 2023-01-29 DIAGNOSIS — H43813 Vitreous degeneration, bilateral: Secondary | ICD-10-CM | POA: Diagnosis not present

## 2023-02-22 DIAGNOSIS — M48062 Spinal stenosis, lumbar region with neurogenic claudication: Secondary | ICD-10-CM | POA: Diagnosis not present

## 2023-02-22 DIAGNOSIS — M5416 Radiculopathy, lumbar region: Secondary | ICD-10-CM | POA: Diagnosis not present

## 2023-02-22 DIAGNOSIS — M7918 Myalgia, other site: Secondary | ICD-10-CM | POA: Diagnosis not present

## 2023-02-22 DIAGNOSIS — M1711 Unilateral primary osteoarthritis, right knee: Secondary | ICD-10-CM | POA: Diagnosis not present

## 2023-05-21 DIAGNOSIS — Z131 Encounter for screening for diabetes mellitus: Secondary | ICD-10-CM | POA: Diagnosis not present

## 2023-05-21 DIAGNOSIS — E538 Deficiency of other specified B group vitamins: Secondary | ICD-10-CM | POA: Diagnosis not present

## 2023-05-21 DIAGNOSIS — E782 Mixed hyperlipidemia: Secondary | ICD-10-CM | POA: Diagnosis not present

## 2023-05-21 DIAGNOSIS — I10 Essential (primary) hypertension: Secondary | ICD-10-CM | POA: Diagnosis not present

## 2023-05-21 DIAGNOSIS — M1A00X0 Idiopathic chronic gout, unspecified site, without tophus (tophi): Secondary | ICD-10-CM | POA: Diagnosis not present

## 2023-05-28 DIAGNOSIS — F5101 Primary insomnia: Secondary | ICD-10-CM | POA: Diagnosis not present

## 2023-05-28 DIAGNOSIS — G629 Polyneuropathy, unspecified: Secondary | ICD-10-CM | POA: Diagnosis not present

## 2023-05-28 DIAGNOSIS — C61 Malignant neoplasm of prostate: Secondary | ICD-10-CM | POA: Diagnosis not present

## 2023-05-28 DIAGNOSIS — I10 Essential (primary) hypertension: Secondary | ICD-10-CM | POA: Diagnosis not present

## 2023-05-28 DIAGNOSIS — Z0000 Encounter for general adult medical examination without abnormal findings: Secondary | ICD-10-CM | POA: Diagnosis not present

## 2023-05-28 DIAGNOSIS — E538 Deficiency of other specified B group vitamins: Secondary | ICD-10-CM | POA: Diagnosis not present

## 2023-05-28 DIAGNOSIS — Z720 Tobacco use: Secondary | ICD-10-CM | POA: Diagnosis not present

## 2023-05-28 DIAGNOSIS — E782 Mixed hyperlipidemia: Secondary | ICD-10-CM | POA: Diagnosis not present

## 2023-05-28 DIAGNOSIS — F325 Major depressive disorder, single episode, in full remission: Secondary | ICD-10-CM | POA: Diagnosis not present

## 2023-05-28 DIAGNOSIS — M1A00X0 Idiopathic chronic gout, unspecified site, without tophus (tophi): Secondary | ICD-10-CM | POA: Diagnosis not present

## 2023-06-26 ENCOUNTER — Other Ambulatory Visit: Payer: Self-pay

## 2023-06-26 DIAGNOSIS — F17210 Nicotine dependence, cigarettes, uncomplicated: Secondary | ICD-10-CM

## 2023-06-26 DIAGNOSIS — Z122 Encounter for screening for malignant neoplasm of respiratory organs: Secondary | ICD-10-CM

## 2023-06-26 DIAGNOSIS — Z87891 Personal history of nicotine dependence: Secondary | ICD-10-CM

## 2023-07-23 ENCOUNTER — Ambulatory Visit
Admission: RE | Admit: 2023-07-23 | Discharge: 2023-07-23 | Disposition: A | Discharge status: Home / Self Care | Source: Ambulatory Visit | Attending: Acute Care | Admitting: Acute Care

## 2023-07-23 DIAGNOSIS — I700 Atherosclerosis of aorta: Secondary | ICD-10-CM | POA: Insufficient documentation

## 2023-07-23 DIAGNOSIS — Z87891 Personal history of nicotine dependence: Secondary | ICD-10-CM | POA: Diagnosis present

## 2023-07-23 DIAGNOSIS — F17210 Nicotine dependence, cigarettes, uncomplicated: Secondary | ICD-10-CM | POA: Diagnosis not present

## 2023-07-23 DIAGNOSIS — J432 Centrilobular emphysema: Secondary | ICD-10-CM | POA: Diagnosis not present

## 2023-07-23 DIAGNOSIS — Z122 Encounter for screening for malignant neoplasm of respiratory organs: Secondary | ICD-10-CM | POA: Diagnosis not present

## 2023-07-23 DIAGNOSIS — J439 Emphysema, unspecified: Secondary | ICD-10-CM | POA: Insufficient documentation

## 2023-08-21 DIAGNOSIS — Z1331 Encounter for screening for depression: Secondary | ICD-10-CM | POA: Diagnosis not present

## 2023-08-21 DIAGNOSIS — M1040 Other secondary gout, unspecified site: Secondary | ICD-10-CM | POA: Diagnosis not present

## 2023-08-21 DIAGNOSIS — G629 Polyneuropathy, unspecified: Secondary | ICD-10-CM | POA: Diagnosis not present

## 2023-08-21 DIAGNOSIS — F1090 Alcohol use, unspecified, uncomplicated: Secondary | ICD-10-CM | POA: Diagnosis not present

## 2023-08-21 DIAGNOSIS — E538 Deficiency of other specified B group vitamins: Secondary | ICD-10-CM | POA: Diagnosis not present

## 2023-08-29 DIAGNOSIS — C61 Malignant neoplasm of prostate: Secondary | ICD-10-CM | POA: Diagnosis not present

## 2023-08-29 DIAGNOSIS — F5101 Primary insomnia: Secondary | ICD-10-CM | POA: Diagnosis not present

## 2023-08-29 DIAGNOSIS — I10 Essential (primary) hypertension: Secondary | ICD-10-CM | POA: Diagnosis not present

## 2023-08-29 DIAGNOSIS — E782 Mixed hyperlipidemia: Secondary | ICD-10-CM | POA: Diagnosis not present

## 2023-08-29 DIAGNOSIS — G629 Polyneuropathy, unspecified: Secondary | ICD-10-CM | POA: Diagnosis not present

## 2023-08-29 DIAGNOSIS — Z131 Encounter for screening for diabetes mellitus: Secondary | ICD-10-CM | POA: Diagnosis not present

## 2023-09-22 ENCOUNTER — Inpatient Hospital Stay: Payer: Worker's Compensation | Admitting: Anesthesiology

## 2023-09-22 ENCOUNTER — Other Ambulatory Visit: Payer: Self-pay

## 2023-09-22 ENCOUNTER — Encounter
Admission: EM | Disposition: A | Discharge status: Skilled Nursing Facility | Payer: Self-pay | Source: Home / Self Care | Attending: Obstetrics and Gynecology

## 2023-09-22 ENCOUNTER — Inpatient Hospital Stay: Payer: Worker's Compensation

## 2023-09-22 ENCOUNTER — Emergency Department: Payer: Worker's Compensation

## 2023-09-22 ENCOUNTER — Encounter: Payer: Self-pay | Admitting: Emergency Medicine

## 2023-09-22 ENCOUNTER — Inpatient Hospital Stay
Admission: EM | Admit: 2023-09-22 | Discharge: 2023-09-25 | DRG: 956 | Disposition: A | Discharge status: Skilled Nursing Facility | Payer: Worker's Compensation | Attending: Obstetrics and Gynecology | Admitting: Obstetrics and Gynecology

## 2023-09-22 DIAGNOSIS — Z923 Personal history of irradiation: Secondary | ICD-10-CM

## 2023-09-22 DIAGNOSIS — F1020 Alcohol dependence, uncomplicated: Secondary | ICD-10-CM | POA: Diagnosis present

## 2023-09-22 DIAGNOSIS — F0393 Unspecified dementia, unspecified severity, with mood disturbance: Secondary | ICD-10-CM | POA: Diagnosis present

## 2023-09-22 DIAGNOSIS — F17220 Nicotine dependence, chewing tobacco, uncomplicated: Secondary | ICD-10-CM | POA: Diagnosis not present

## 2023-09-22 DIAGNOSIS — Z79899 Other long term (current) drug therapy: Secondary | ICD-10-CM | POA: Diagnosis not present

## 2023-09-22 DIAGNOSIS — I10 Essential (primary) hypertension: Secondary | ICD-10-CM | POA: Diagnosis present

## 2023-09-22 DIAGNOSIS — S72142A Displaced intertrochanteric fracture of left femur, initial encounter for closed fracture: Secondary | ICD-10-CM | POA: Diagnosis not present

## 2023-09-22 DIAGNOSIS — M109 Gout, unspecified: Secondary | ICD-10-CM | POA: Diagnosis present

## 2023-09-22 DIAGNOSIS — N179 Acute kidney failure, unspecified: Secondary | ICD-10-CM | POA: Diagnosis present

## 2023-09-22 DIAGNOSIS — F0390 Unspecified dementia without behavioral disturbance: Secondary | ICD-10-CM | POA: Diagnosis present

## 2023-09-22 DIAGNOSIS — Z7401 Bed confinement status: Secondary | ICD-10-CM | POA: Diagnosis not present

## 2023-09-22 DIAGNOSIS — S32592A Other specified fracture of left pubis, initial encounter for closed fracture: Secondary | ICD-10-CM | POA: Diagnosis present

## 2023-09-22 DIAGNOSIS — F32A Depression, unspecified: Secondary | ICD-10-CM | POA: Diagnosis not present

## 2023-09-22 DIAGNOSIS — I472 Ventricular tachycardia, unspecified: Secondary | ICD-10-CM | POA: Diagnosis not present

## 2023-09-22 DIAGNOSIS — S8992XA Unspecified injury of left lower leg, initial encounter: Secondary | ICD-10-CM | POA: Diagnosis not present

## 2023-09-22 DIAGNOSIS — D6959 Other secondary thrombocytopenia: Secondary | ICD-10-CM | POA: Diagnosis not present

## 2023-09-22 DIAGNOSIS — Z8546 Personal history of malignant neoplasm of prostate: Secondary | ICD-10-CM

## 2023-09-22 DIAGNOSIS — G629 Polyneuropathy, unspecified: Secondary | ICD-10-CM | POA: Diagnosis not present

## 2023-09-22 DIAGNOSIS — E785 Hyperlipidemia, unspecified: Secondary | ICD-10-CM | POA: Diagnosis not present

## 2023-09-22 DIAGNOSIS — W1839XA Other fall on same level, initial encounter: Secondary | ICD-10-CM | POA: Diagnosis present

## 2023-09-22 DIAGNOSIS — Z7982 Long term (current) use of aspirin: Secondary | ICD-10-CM | POA: Diagnosis not present

## 2023-09-22 DIAGNOSIS — S50312A Abrasion of left elbow, initial encounter: Secondary | ICD-10-CM | POA: Diagnosis not present

## 2023-09-22 DIAGNOSIS — S72009A Fracture of unspecified part of neck of unspecified femur, initial encounter for closed fracture: Secondary | ICD-10-CM | POA: Diagnosis not present

## 2023-09-22 DIAGNOSIS — Y92008 Other place in unspecified non-institutional (private) residence as the place of occurrence of the external cause: Secondary | ICD-10-CM | POA: Diagnosis not present

## 2023-09-22 DIAGNOSIS — R2689 Other abnormalities of gait and mobility: Secondary | ICD-10-CM | POA: Diagnosis not present

## 2023-09-22 DIAGNOSIS — Z9181 History of falling: Secondary | ICD-10-CM | POA: Diagnosis not present

## 2023-09-22 DIAGNOSIS — C61 Malignant neoplasm of prostate: Secondary | ICD-10-CM | POA: Diagnosis present

## 2023-09-22 DIAGNOSIS — D62 Acute posthemorrhagic anemia: Secondary | ICD-10-CM | POA: Diagnosis not present

## 2023-09-22 DIAGNOSIS — S72002A Fracture of unspecified part of neck of left femur, initial encounter for closed fracture: Secondary | ICD-10-CM | POA: Diagnosis not present

## 2023-09-22 DIAGNOSIS — S7292XD Unspecified fracture of left femur, subsequent encounter for closed fracture with routine healing: Secondary | ICD-10-CM | POA: Diagnosis not present

## 2023-09-22 DIAGNOSIS — Z720 Tobacco use: Secondary | ICD-10-CM | POA: Diagnosis present

## 2023-09-22 DIAGNOSIS — Z8249 Family history of ischemic heart disease and other diseases of the circulatory system: Secondary | ICD-10-CM | POA: Diagnosis not present

## 2023-09-22 DIAGNOSIS — S79912A Unspecified injury of left hip, initial encounter: Secondary | ICD-10-CM | POA: Diagnosis not present

## 2023-09-22 DIAGNOSIS — Z791 Long term (current) use of non-steroidal anti-inflammatories (NSAID): Secondary | ICD-10-CM

## 2023-09-22 DIAGNOSIS — M6281 Muscle weakness (generalized): Secondary | ICD-10-CM | POA: Diagnosis not present

## 2023-09-22 DIAGNOSIS — W19XXXA Unspecified fall, initial encounter: Secondary | ICD-10-CM | POA: Diagnosis not present

## 2023-09-22 DIAGNOSIS — F1010 Alcohol abuse, uncomplicated: Secondary | ICD-10-CM | POA: Diagnosis present

## 2023-09-22 DIAGNOSIS — S72142D Displaced intertrochanteric fracture of left femur, subsequent encounter for closed fracture with routine healing: Secondary | ICD-10-CM | POA: Diagnosis not present

## 2023-09-22 HISTORY — PX: INTRAMEDULLARY (IM) NAIL INTERTROCHANTERIC: SHX5875

## 2023-09-22 LAB — BASIC METABOLIC PANEL WITH GFR
Anion gap: 8 (ref 5–15)
BUN: 12 mg/dL (ref 8–23)
CO2: 23 mmol/L (ref 22–32)
Calcium: 8.6 mg/dL — ABNORMAL LOW (ref 8.9–10.3)
Chloride: 105 mmol/L (ref 98–111)
Creatinine, Ser: 0.95 mg/dL (ref 0.61–1.24)
GFR, Estimated: >60 mL/min (ref >60)
Glucose, Bld: 121 mg/dL — ABNORMAL HIGH (ref 70–99)
Potassium: 3.8 mmol/L (ref 3.5–5.1)
Sodium: 136 mmol/L (ref 135–145)

## 2023-09-22 LAB — CBC WITH DIFFERENTIAL/PLATELET
Abs Immature Granulocytes: 0.04 10*3/uL (ref 0.00–0.07)
Basophils Absolute: 0 10*3/uL (ref 0.0–0.1)
Basophils Relative: 1 %
Eosinophils Absolute: 0.1 10*3/uL (ref 0.0–0.5)
Eosinophils Relative: 2 %
HCT: 39.4 % (ref 39.0–52.0)
Hemoglobin: 13.3 g/dL (ref 13.0–17.0)
Immature Granulocytes: 1 %
Lymphocytes Relative: 14 %
Lymphs Abs: 0.9 10*3/uL (ref 0.7–4.0)
MCH: 36.3 pg — ABNORMAL HIGH (ref 26.0–34.0)
MCHC: 33.8 g/dL (ref 30.0–36.0)
MCV: 107.7 fL — ABNORMAL HIGH (ref 80.0–100.0)
Monocytes Absolute: 0.3 10*3/uL (ref 0.1–1.0)
Monocytes Relative: 5 %
Neutro Abs: 5.1 10*3/uL (ref 1.7–7.7)
Neutrophils Relative %: 77 %
Platelets: 92 10*3/uL — ABNORMAL LOW (ref 150–400)
RBC: 3.66 MIL/uL — ABNORMAL LOW (ref 4.22–5.81)
RDW: 13.4 % (ref 11.5–15.5)
WBC: 6.4 10*3/uL (ref 4.0–10.5)
nRBC: 0 % (ref 0.0–0.2)

## 2023-09-22 LAB — TYPE AND SCREEN
ABO/RH(D): O NEG
Antibody Screen: NEGATIVE

## 2023-09-22 LAB — GLUCOSE, CAPILLARY: Glucose-Capillary: 107 mg/dL — ABNORMAL HIGH (ref 70–99)

## 2023-09-22 LAB — PROTIME-INR
INR: 1.1 (ref 0.8–1.2)
Prothrombin Time: 14.2 s (ref 11.4–15.2)

## 2023-09-22 LAB — PSA: Prostatic Specific Antigen: 0.04 ng/mL (ref 0.00–4.00)

## 2023-09-22 LAB — MAGNESIUM: Magnesium: 1.9 mg/dL (ref 1.7–2.4)

## 2023-09-22 SURGERY — FIXATION, FRACTURE, INTERTROCHANTERIC, WITH INTRAMEDULLARY ROD
Anesthesia: General | Site: Hip | Laterality: Left

## 2023-09-22 MED ORDER — ALUM & MAG HYDROXIDE-SIMETH 200-200-20 MG/5ML PO SUSP: 30.0000 mL | ORAL | Status: DC | PRN

## 2023-09-22 MED ORDER — CEFAZOLIN SODIUM-DEXTROSE 2-4 GM/100ML-% IV SOLN: 2.0000 g | INTRAVENOUS | Status: DC

## 2023-09-22 MED ORDER — ONDANSETRON HCL 4 MG/2ML IJ SOLN
INTRAMUSCULAR | Status: AC
Start: 2023-09-22 — End: 2023-09-22
  Filled 2023-09-22: qty 2

## 2023-09-22 MED ORDER — ASPIRIN 81 MG PO CHEW: 81.0000 mg | CHEWABLE_TABLET | Freq: Every day | ORAL | Status: DC

## 2023-09-22 MED ORDER — BISACODYL 5 MG PO TBEC: 5.0000 mg | DELAYED_RELEASE_TABLET | Freq: Every day | ORAL | Status: DC | PRN

## 2023-09-22 MED ORDER — PROPOFOL 10 MG/ML IV BOLUS
INTRAVENOUS | Status: AC
  Filled 2023-09-22: qty 20

## 2023-09-22 MED ORDER — ALLOPURINOL 300 MG PO TABS
600.0000 mg | ORAL_TABLET | Freq: Every day | ORAL | Status: DC
  Administered 2023-09-23 – 2023-09-25 (×3): 600 mg via ORAL
  Filled 2023-09-22 (×4): qty 2

## 2023-09-22 MED ORDER — TRANEXAMIC ACID-NACL 1000-0.7 MG/100ML-% IV SOLN
1000.0000 mg | Freq: Once | INTRAVENOUS | Status: AC
  Administered 2023-09-22: 1000 mg via INTRAVENOUS

## 2023-09-22 MED ORDER — FOLIC ACID 1 MG PO TABS
1.0000 mg | ORAL_TABLET | Freq: Every day | ORAL | Status: DC
  Administered 2023-09-23 – 2023-09-25 (×3): 1 mg via ORAL
  Filled 2023-09-22 (×3): qty 1

## 2023-09-22 MED ORDER — SEVOFLURANE IN SOLN
RESPIRATORY_TRACT | Status: AC
  Filled 2023-09-22: qty 250

## 2023-09-22 MED ORDER — DEXAMETHASONE SODIUM PHOSPHATE 10 MG/ML IJ SOLN
INTRAMUSCULAR | Status: AC
  Filled 2023-09-22: qty 1

## 2023-09-22 MED ORDER — ROCURONIUM BROMIDE 100 MG/10ML IV SOLN
INTRAVENOUS | Status: DC | PRN
  Administered 2023-09-22: 40 mg via INTRAVENOUS

## 2023-09-22 MED ORDER — ONDANSETRON HCL 4 MG PO TABS: 4.0000 mg | ORAL_TABLET | Freq: Four times a day (QID) | ORAL | Status: DC | PRN

## 2023-09-22 MED ORDER — DEXAMETHASONE SODIUM PHOSPHATE 10 MG/ML IJ SOLN
INTRAMUSCULAR | Status: DC | PRN
  Administered 2023-09-22: 10 mg via INTRAVENOUS

## 2023-09-22 MED ORDER — CEFAZOLIN SODIUM-DEXTROSE 2-4 GM/100ML-% IV SOLN
2.0000 g | INTRAVENOUS | Status: AC
  Administered 2023-09-22: 2 g via INTRAVENOUS

## 2023-09-22 MED ORDER — ADULT MULTIVITAMIN W/MINERALS CH
1.0000 | ORAL_TABLET | Freq: Every day | ORAL | Status: DC
  Administered 2023-09-23 – 2023-09-25 (×3): 1 via ORAL
  Filled 2023-09-22 (×3): qty 1

## 2023-09-22 MED ORDER — METHOCARBAMOL 500 MG PO TABS
500.0000 mg | ORAL_TABLET | Freq: Four times a day (QID) | ORAL | Status: DC | PRN
Start: 2023-09-22 — End: 2023-09-22

## 2023-09-22 MED ORDER — MORPHINE SULFATE (PF) 2 MG/ML IV SOLN: 1.0000 mg | INTRAVENOUS | Status: DC | PRN

## 2023-09-22 MED ORDER — LORAZEPAM 2 MG/ML IJ SOLN: 1.0000 mg | INTRAMUSCULAR | Status: DC | PRN

## 2023-09-22 MED ORDER — ACETAMINOPHEN 10 MG/ML IV SOLN
INTRAVENOUS | Status: AC
  Filled 2023-09-22: qty 100

## 2023-09-22 MED ORDER — METHOCARBAMOL 1000 MG/10ML IJ SOLN
500.0000 mg | Freq: Four times a day (QID) | INTRAMUSCULAR | Status: DC | PRN
  Filled 2023-09-22: qty 5

## 2023-09-22 MED ORDER — TRANEXAMIC ACID-NACL 1000-0.7 MG/100ML-% IV SOLN
INTRAVENOUS | Status: AC
  Filled 2023-09-22: qty 100

## 2023-09-22 MED ORDER — HYDROCODONE-ACETAMINOPHEN 5-325 MG PO TABS
1.0000 | ORAL_TABLET | ORAL | Status: DC | PRN
  Administered 2023-09-22: 2 via ORAL
  Administered 2023-09-23 (×2): 1 via ORAL
  Administered 2023-09-23 – 2023-09-25 (×4): 2 via ORAL
  Filled 2023-09-22: qty 2
  Filled 2023-09-22: qty 1
  Filled 2023-09-22 (×2): qty 2
  Filled 2023-09-22: qty 1
  Filled 2023-09-22 (×3): qty 2

## 2023-09-22 MED ORDER — BISACODYL 10 MG RE SUPP: 10.0000 mg | Freq: Every day | RECTAL | Status: DC | PRN

## 2023-09-22 MED ORDER — HYDROMORPHONE HCL 1 MG/ML IJ SOLN
1.0000 mg | INTRAMUSCULAR | Status: DC | PRN
  Administered 2023-09-22: 1 mg via INTRAVENOUS
  Filled 2023-09-22: qty 1

## 2023-09-22 MED ORDER — THIAMINE HCL 100 MG/ML IJ SOLN
100.0000 mg | Freq: Every day | INTRAMUSCULAR | Status: DC
  Administered 2023-09-22: 100 mg via INTRAVENOUS
  Filled 2023-09-22: qty 2

## 2023-09-22 MED ORDER — PHENOL 1.4 % MT LIQD: 1.0000 | OROMUCOSAL | Status: DC | PRN

## 2023-09-22 MED ORDER — ENOXAPARIN SODIUM 30 MG/0.3ML IJ SOSY
30.0000 mg | PREFILLED_SYRINGE | INTRAMUSCULAR | Status: DC
  Administered 2023-09-23 – 2023-09-25 (×3): 30 mg via SUBCUTANEOUS
  Filled 2023-09-22 (×3): qty 0.3

## 2023-09-22 MED ORDER — CHLORHEXIDINE GLUCONATE 4 % EX SOLN: Freq: Once | CUTANEOUS | Status: DC

## 2023-09-22 MED ORDER — FLEET ENEMA RE ENEM
1.0000 | ENEMA | Freq: Once | RECTAL | Status: DC | PRN
Start: 2023-09-22 — End: 2023-09-25

## 2023-09-22 MED ORDER — EPHEDRINE SULFATE-NACL 50-0.9 MG/10ML-% IV SOSY
PREFILLED_SYRINGE | INTRAVENOUS | Status: DC | PRN
  Administered 2023-09-22: 5 mg via INTRAVENOUS
  Administered 2023-09-22 (×2): 10 mg via INTRAVENOUS

## 2023-09-22 MED ORDER — LIDOCAINE HCL (PF) 2 % IJ SOLN
INTRAMUSCULAR | Status: AC
  Filled 2023-09-22: qty 5

## 2023-09-22 MED ORDER — ROCURONIUM BROMIDE 10 MG/ML (PF) SYRINGE
PREFILLED_SYRINGE | INTRAVENOUS | Status: AC
Start: 2023-09-22 — End: 2023-09-22
  Filled 2023-09-22: qty 10

## 2023-09-22 MED ORDER — FENTANYL CITRATE (PF) 100 MCG/2ML IJ SOLN
25.0000 ug | INTRAMUSCULAR | Status: DC | PRN
  Administered 2023-09-22 (×2): 50 ug via INTRAVENOUS

## 2023-09-22 MED ORDER — FENTANYL CITRATE (PF) 100 MCG/2ML IJ SOLN
INTRAMUSCULAR | Status: AC
Start: 2023-09-22 — End: 2023-09-22
  Filled 2023-09-22: qty 2

## 2023-09-22 MED ORDER — MORPHINE SULFATE (PF) 2 MG/ML IV SOLN
0.5000 mg | INTRAVENOUS | Status: DC | PRN
  Administered 2023-09-24: 1 mg via INTRAVENOUS
  Filled 2023-09-22: qty 1

## 2023-09-22 MED ORDER — DOCUSATE SODIUM 100 MG PO CAPS: 100.0000 mg | ORAL_CAPSULE | Freq: Two times a day (BID) | ORAL | Status: DC

## 2023-09-22 MED ORDER — NICOTINE 14 MG/24HR TD PT24: 14.0000 mg | MEDICATED_PATCH | Freq: Every day | TRANSDERMAL | Status: DC | PRN

## 2023-09-22 MED ORDER — CEFAZOLIN SODIUM-DEXTROSE 2-4 GM/100ML-% IV SOLN
INTRAVENOUS | Status: AC
Start: 2023-09-22 — End: 2023-09-22
  Filled 2023-09-22: qty 100

## 2023-09-22 MED ORDER — FERROUS SULFATE 325 (65 FE) MG PO TABS
325.0000 mg | ORAL_TABLET | Freq: Every day | ORAL | Status: DC
  Administered 2023-09-23 – 2023-09-25 (×3): 325 mg via ORAL
  Filled 2023-09-22 (×3): qty 1

## 2023-09-22 MED ORDER — ACETAMINOPHEN 325 MG PO TABS: 325.0000 mg | ORAL_TABLET | Freq: Four times a day (QID) | ORAL | Status: DC | PRN

## 2023-09-22 MED ORDER — GENTAMICIN SULFATE 40 MG/ML IJ SOLN
INTRAMUSCULAR | Status: DC | PRN
  Administered 2023-09-22: 80 mg

## 2023-09-22 MED ORDER — ROSUVASTATIN CALCIUM 10 MG PO TABS
10.0000 mg | ORAL_TABLET | Freq: Every day | ORAL | Status: DC
  Administered 2023-09-22 – 2023-09-24 (×3): 10 mg via ORAL
  Filled 2023-09-22 (×3): qty 1

## 2023-09-22 MED ORDER — HYDRALAZINE HCL 20 MG/ML IJ SOLN: 5.0000 mg | INTRAMUSCULAR | Status: DC | PRN

## 2023-09-22 MED ORDER — SODIUM CHLORIDE 0.9 % IV SOLN: INTRAVENOUS | Status: DC

## 2023-09-22 MED ORDER — OXYCODONE HCL 5 MG PO TABS
ORAL_TABLET | ORAL | Status: AC
  Filled 2023-09-22: qty 1

## 2023-09-22 MED ORDER — OXYCODONE HCL 5 MG PO TABS
5.0000 mg | ORAL_TABLET | Freq: Once | ORAL | Status: AC | PRN
  Administered 2023-09-22: 5 mg via ORAL

## 2023-09-22 MED ORDER — LACTATED RINGERS IV SOLN: INTRAVENOUS | Status: DC | PRN

## 2023-09-22 MED ORDER — ACETAMINOPHEN 10 MG/ML IV SOLN
INTRAVENOUS | Status: DC | PRN
  Administered 2023-09-22: 1000 mg via INTRAVENOUS

## 2023-09-22 MED ORDER — PHENYLEPHRINE HCL-NACL 20-0.9 MG/250ML-% IV SOLN
INTRAVENOUS | Status: DC | PRN
  Administered 2023-09-22: 40 ug/min via INTRAVENOUS

## 2023-09-22 MED ORDER — GENTAMICIN SULFATE 40 MG/ML IJ SOLN
INTRAMUSCULAR | Status: AC
Start: 2023-09-22 — End: 2023-09-22
  Filled 2023-09-22: qty 2

## 2023-09-22 MED ORDER — ZOLPIDEM TARTRATE 5 MG PO TABS: 5.0000 mg | ORAL_TABLET | Freq: Every evening | ORAL | Status: DC | PRN

## 2023-09-22 MED ORDER — SODIUM CHLORIDE 0.45 % IV SOLN: INTRAVENOUS | Status: AC

## 2023-09-22 MED ORDER — MENTHOL 3 MG MT LOZG: 1.0000 | LOZENGE | OROMUCOSAL | Status: DC | PRN

## 2023-09-22 MED ORDER — BUPIVACAINE-EPINEPHRINE (PF) 0.5% -1:200000 IJ SOLN
INTRAMUSCULAR | Status: AC
  Filled 2023-09-22: qty 60

## 2023-09-22 MED ORDER — OXYCODONE HCL 5 MG/5ML PO SOLN: 5.0000 mg | Freq: Once | ORAL | Status: AC | PRN

## 2023-09-22 MED ORDER — HYDROMORPHONE HCL 1 MG/ML IJ SOLN
0.5000 mg | Freq: Once | INTRAMUSCULAR | Status: AC
  Administered 2023-09-22: 0.5 mg via INTRAVENOUS
  Filled 2023-09-22: qty 0.5

## 2023-09-22 MED ORDER — CEFAZOLIN SODIUM-DEXTROSE 2-4 GM/100ML-% IV SOLN
2.0000 g | Freq: Three times a day (TID) | INTRAVENOUS | Status: AC
Start: 2023-09-22 — End: 2023-09-23
  Administered 2023-09-22 – 2023-09-23 (×3): 2 g via INTRAVENOUS
  Filled 2023-09-22 (×3): qty 100

## 2023-09-22 MED ORDER — LIDOCAINE HCL (CARDIAC) PF 100 MG/5ML IV SOSY
PREFILLED_SYRINGE | INTRAVENOUS | Status: DC | PRN
  Administered 2023-09-22: 100 mg via INTRAVENOUS

## 2023-09-22 MED ORDER — LORAZEPAM 1 MG PO TABS: 1.0000 mg | ORAL_TABLET | ORAL | Status: DC | PRN

## 2023-09-22 MED ORDER — FENTANYL CITRATE (PF) 100 MCG/2ML IJ SOLN
INTRAMUSCULAR | Status: DC | PRN
  Administered 2023-09-22: 50 ug via INTRAVENOUS

## 2023-09-22 MED ORDER — PROPOFOL 10 MG/ML IV BOLUS
INTRAVENOUS | Status: DC | PRN
  Administered 2023-09-22: 130 mg via INTRAVENOUS

## 2023-09-22 MED ORDER — SENNA 8.6 MG PO TABS
1.0000 | ORAL_TABLET | Freq: Two times a day (BID) | ORAL | Status: DC
  Administered 2023-09-22 – 2023-09-25 (×6): 8.6 mg via ORAL
  Filled 2023-09-22 (×6): qty 1

## 2023-09-22 MED ORDER — METOCLOPRAMIDE HCL 5 MG/ML IJ SOLN: 5.0000 mg | Freq: Three times a day (TID) | INTRAMUSCULAR | Status: DC | PRN

## 2023-09-22 MED ORDER — 0.9 % SODIUM CHLORIDE (POUR BTL) OPTIME
TOPICAL | Status: DC | PRN
  Administered 2023-09-22: 500 mL

## 2023-09-22 MED ORDER — ONDANSETRON HCL 4 MG/2ML IJ SOLN
INTRAMUSCULAR | Status: DC | PRN
  Administered 2023-09-22: 4 mg via INTRAVENOUS

## 2023-09-22 MED ORDER — ONDANSETRON HCL 4 MG/2ML IJ SOLN
4.0000 mg | Freq: Once | INTRAMUSCULAR | Status: AC
  Administered 2023-09-22: 4 mg via INTRAVENOUS
  Filled 2023-09-22: qty 2

## 2023-09-22 MED ORDER — THIAMINE MONONITRATE 100 MG PO TABS
100.0000 mg | ORAL_TABLET | Freq: Every day | ORAL | Status: DC
  Administered 2023-09-23 – 2023-09-25 (×3): 100 mg via ORAL
  Filled 2023-09-22 (×3): qty 1

## 2023-09-22 MED ORDER — PREGABALIN 50 MG PO CAPS: 100.0000 mg | ORAL_CAPSULE | Freq: Two times a day (BID) | ORAL | Status: DC

## 2023-09-22 MED ORDER — SUGAMMADEX SODIUM 200 MG/2ML IV SOLN
INTRAVENOUS | Status: DC | PRN
  Administered 2023-09-22: 200 mg via INTRAVENOUS

## 2023-09-22 MED ORDER — MORPHINE SULFATE (PF) 4 MG/ML IV SOLN
4.0000 mg | Freq: Once | INTRAVENOUS | Status: AC
  Administered 2023-09-22: 4 mg via INTRAVENOUS
  Filled 2023-09-22: qty 1

## 2023-09-22 MED ORDER — TRANEXAMIC ACID-NACL 1000-0.7 MG/100ML-% IV SOLN
1000.0000 mg | INTRAVENOUS | Status: AC
  Administered 2023-09-22: 1000 mg via INTRAVENOUS
  Filled 2023-09-22: qty 100

## 2023-09-22 MED ORDER — METOCLOPRAMIDE HCL 5 MG PO TABS: 5.0000 mg | ORAL_TABLET | Freq: Three times a day (TID) | ORAL | Status: DC | PRN

## 2023-09-22 MED ORDER — ONDANSETRON HCL 4 MG/2ML IJ SOLN: 4.0000 mg | Freq: Four times a day (QID) | INTRAMUSCULAR | Status: DC | PRN

## 2023-09-22 MED ORDER — BUPIVACAINE-EPINEPHRINE (PF) 0.5% -1:200000 IJ SOLN
INTRAMUSCULAR | Status: DC | PRN
  Administered 2023-09-22: 45 mL via PERINEURAL

## 2023-09-22 MED ORDER — MAGNESIUM HYDROXIDE 400 MG/5ML PO SUSP
30.0000 mL | Freq: Every day | ORAL | Status: DC | PRN
  Administered 2023-09-23: 30 mL via ORAL
  Filled 2023-09-22: qty 30

## 2023-09-22 MED ORDER — TRAZODONE HCL 50 MG PO TABS
50.0000 mg | ORAL_TABLET | Freq: Every day | ORAL | Status: DC
  Administered 2023-09-22: 50 mg via ORAL
  Filled 2023-09-22 (×2): qty 1

## 2023-09-22 MED ORDER — POLYETHYLENE GLYCOL 3350 17 G PO PACK
17.0000 g | PACK | Freq: Every day | ORAL | Status: DC | PRN
Start: 2023-09-22 — End: 2023-09-22

## 2023-09-22 MED ORDER — PHENYLEPHRINE 80 MCG/ML (10ML) SYRINGE FOR IV PUSH (FOR BLOOD PRESSURE SUPPORT)
PREFILLED_SYRINGE | INTRAVENOUS | Status: DC | PRN
  Administered 2023-09-22: 80 ug via INTRAVENOUS
  Administered 2023-09-22 (×2): 160 ug via INTRAVENOUS
  Administered 2023-09-22: 80 ug via INTRAVENOUS

## 2023-09-22 SURGICAL SUPPLY — 36 items
BIT DRILL CALIBRATED 4.2 (BIT) IMPLANT
BIT DRILL CANN 16 HIP (BIT) IMPLANT
BIT DRILL CANN STP 6/9 HIP (BIT) IMPLANT
BIT DRILL TAPERED 10 (BIT) IMPLANT
BLADE TFNA HELICAL 100 NS (Anchor) IMPLANT
BNDG COHESIVE 4X5 TAN STRL LF (GAUZE/BANDAGES/DRESSINGS) ×1 IMPLANT
CHLORAPREP W/TINT 26 (MISCELLANEOUS) ×2 IMPLANT
DRAPE C-ARMOR (DRAPES) IMPLANT
DRAPE INCISE 23X17 STRL (DRAPES) ×1 IMPLANT
DRAPE INCISE IOBAN 23X17 STRL (DRAPES) ×1 IMPLANT
ELECTRODE REM PT RTRN 9FT ADLT (ELECTROSURGICAL) ×1 IMPLANT
GAUZE SPONGE 4X4 12PLY STRL (GAUZE/BANDAGES/DRESSINGS) ×2 IMPLANT
GLOVE INDICATOR 8.0 STRL GRN (GLOVE) ×1 IMPLANT
GLOVE SURG ORTHO 8.5 STRL (GLOVE) ×1 IMPLANT
GOWN STRL REUS W/ TWL LRG LVL3 (GOWN DISPOSABLE) ×1 IMPLANT
GOWN STRL REUS W/TWL LRG LVL4 (GOWN DISPOSABLE) ×1 IMPLANT
GUIDEWIRE 3.2X400 (WIRE) IMPLANT
KIT TURNOVER KIT A (KITS) ×1 IMPLANT
MANIFOLD NEPTUNE II (INSTRUMENTS) ×1 IMPLANT
MAT ABSORB FLUID 56X50 GRAY (MISCELLANEOUS) ×1 IMPLANT
NAIL TROCH FIX 10X170 130 (Nail) IMPLANT
NDL SPNL 18GX3.5 QUINCKE PK (NEEDLE) ×1 IMPLANT
NEEDLE SPNL 18GX3.5 QUINCKE PK (NEEDLE) ×1 IMPLANT
NS IRRIG 500ML POUR BTL (IV SOLUTION) ×1 IMPLANT
PACK HIP COMPR (MISCELLANEOUS) ×1 IMPLANT
PAD ABD DERMACEA PRESS 5X9 (GAUZE/BANDAGES/DRESSINGS) ×2 IMPLANT
PENCIL SMOKE EVACUATOR (MISCELLANEOUS) IMPLANT
SCREW LOCK STAR 5X44 (Screw) IMPLANT
SOLUTION PREP PVP 2OZ (MISCELLANEOUS) ×1 IMPLANT
STAPLER SKIN PROX 35W (STAPLE) ×1 IMPLANT
SUCTION TUBE FRAZIER 10FR DISP (SUCTIONS) ×1 IMPLANT
SUT VIC AB 0 CT1 36 (SUTURE) ×1 IMPLANT
SUT VIC AB 2-0 CT1 (SUTURE) ×1 IMPLANT
SYR 30ML LL (SYRINGE) ×1 IMPLANT
TRAP FLUID SMOKE EVACUATOR (MISCELLANEOUS) ×1 IMPLANT
WATER STERILE IRR 500ML POUR (IV SOLUTION) ×1 IMPLANT

## 2023-09-22 NOTE — H&P (Signed)
 History and Physical    Patient: Ian Ross FMW:[id number] DOB: 26-Mar-1946 DOA: 09/22/2023 DOS: the patient was seen and examined on 09/22/2023 PCP: Marikay Eva POUR, PA  Patient coming from: Home - lives with wife; NOK: Wife, Otha Monical, [phone]   Chief Complaint: [place]  HPI: Ian Ross is a 78 y.o. male with medical history significant of anxiety/depression, dementia, HTN, and prostate CA who presented on 6/21 with a fall.  He reports that he was in his usual state of health and was fixing a road grater when the bolt slipped and he lost his footing.  He fell backwards, landing on his left hip.  Pain is suboptimally controlled at this time.  No other injuries other than mild L elbow abrasion.  He drinks daily 2-6 beers.  He also smokes.    ER Course:  Fall today, L intertrochanteric and pubic rami fractures.  Possible OR today with Dr. Cleotilde.     Review of Systems: As mentioned in the history of present illness. All other systems reviewed and are negative. Past Medical History:  Diagnosis Date   Anxiety    Arthritis    Chicken pox    Dementia (HCC)    Depression    Gout    Hypertension    Personal history of tobacco use, presenting hazards to health 08/06/2015   Prostate cancer [facility])    Testicular cyst    Dr. Ike   Past Surgical History:  Procedure Laterality Date   CATARACT EXTRACTION W/PHACO Right 04/21/2020   Procedure: CATARACT EXTRACTION PHACO AND INTRAOCULAR LENS PLACEMENT (IOC) RIGHT 5.74 01:01.7 9.3%;  Surgeon: Mittie Gaskin, MD;  Location: [facility];  Service: Ophthalmology;  Laterality: Right;   CATARACT EXTRACTION W/PHACO Left 05/05/2020   Procedure: CATARACT EXTRACTION PHACO AND INTRAOCULAR LENS PLACEMENT (IOC) LEFT 5.05 01:01.5 802%;  Surgeon: Mittie Gaskin, MD;  Location: [facility];  Service: Ophthalmology;  Laterality: Left;   GANGLION CYST EXCISION Right 10/08/2015   Procedure: REMOVAL GANGLION CYST FOOT/2  right foot;  Surgeon: Krystal Rosella, DPM;  Location: [facility];  Service: Podiatry;  Laterality: Right;   KNEE ARTHROSCOPY Right    Dr. Claudene, [facility]   LOWER EXTREMITY ANGIOGRAPHY Left 04/18/2019   Procedure: LOWER EXTREMITY ANGIOGRAPHY;  Surgeon: Jama Cordella MATSU, MD;  Location: [facility];  Service: Cardiovascular;  Laterality: Left;   NASAL SINUS SURGERY     Dr. Lum Gaskins, [facility]   Social History:  reports that he has quit smoking. His smoking use included cigarettes. He has a 30 pack-year smoking history. His smokeless tobacco use includes chew. He reports current alcohol use of about 2.0 standard drinks of alcohol per week. He reports that he does not use drugs.  Allergies  Allergen Reactions   Codeine Nausea Only, Anxiety and Other (See Comments)    Family History  Problem Relation Age of Onset   Cancer Mother        breast   Heart disease Mother    Cancer Father        prostate   Heart disease Father    Breast cancer Neg Hx     Prior to Admission medications   Medication Sig Start Date End Date Taking? Authorizing Provider  allopurinol  (ZYLOPRIM ) 300 MG tablet Take 600 mg by mouth daily. 12/16/18   [provider]  amLODipine  (NORVASC ) 5 MG tablet Take 1 tablet (5 mg total) by mouth daily. 04/18/19 04/17/20  Schnier, Cordella MATSU, MD  aspirin 81 MG  chewable tablet Chew 81 mg by mouth daily.    [provider]  colchicine 0.6 MG tablet Take 0.6 mg by mouth daily as needed (gout).  12/12/13   [provider]  gabapentin  (NEURONTIN ) 100 MG capsule TAKE 1 CAPSULE(100 MG) BY MOUTH THREE TIMES DAILY 07/25/21   Janit Thresa HERO, DPM  meloxicam  (MOBIC ) 7.5 MG tablet Take 7.5 mg by mouth daily.    [provider]  vitamin B-12 (CYANOCOBALAMIN ) 500 MCG tablet Take 500 mcg by mouth daily.    [provider]    Physical Exam: Vitals:   09/22/23 0850  BP: (!) 154/79  Pulse: 72  Resp: 15  Temp: 97.7 F (36.5 C)  TempSrc: Oral  SpO2: 97%    General:  Appears calm and comfortable and is in NAD Eyes:  PERRL, EOMI, normal lids, iris ENT:  grossly normal hearing, lips & tongue, mmm; artificial upper and absent lower dentition Neck:  no LAD, masses or thyromegaly Cardiovascular:  RRR. No LE edema.  Respiratory:   CTA bilaterally with no wheezes/rales/rhonchi.  Normal respiratory effort. Abdomen:  soft, NT, ND Skin:  no rash or induration seen on limited exam; mild L elbow abrasion Musculoskeletal:  LLE is shortened and externally rotated Lower extremity:  No LE edema.  Limited foot exam with no ulcerations.  2+ distal pulses. Psychiatric:  blunted mood and affect, speech fluent and appropriate, AOx3 Neurologic:  CN 2-12 grossly intact, moves all extremities in coordinated fashion, sensation intactRadiological Exams on Admission: Independently reviewed - see discussion in A/P where applicable  DG Chest 1 View Result Date: 09/22/2023 CLINICAL DATA:  Status post fall with left femoral fracture EXAM: CHEST  1 VIEW COMPARISON:  CT chest dated 07/23/2023 FINDINGS: Normal lung volumes. No focal consolidations. No pleural effusion or pneumothorax. The heart size and mediastinal contours are within normal limits. No acute osseous abnormality. IMPRESSION: No acute disease. Electronically Signed   By: Limin  Xu M.D.   On: 09/22/2023 09:36   DG Hip Unilat W or Wo Pelvis 2-3 Views Left Result Date: 09/22/2023 CLINICAL DATA:  Status post fall with left leg deformity EXAM: DG HIP (WITH OR WITHOUT PELVIS) 3V LEFT COMPARISON:  None Available. FINDINGS: Comminuted left intertrochanteric fracture with varus angulation. Anatomic alignment of the left femoroacetabular joint. Possible minimally displaced fracture of the inferior left pubic ramus. Radiopaque prostate fiducials project over the midline inferior pelvis. IMPRESSION: 1. Comminuted left intertrochanteric fracture with varus angulation. 2. Possible minimally displaced fracture of the inferior left  pubic ramus. Electronically Signed   By: Limin  Xu M.D.   On: 09/22/2023 09:35    EKG: Independently reviewed.  NSR with rate 65; nonspecific ST changes with no evidence of acute ischemia   Labs on Admission: I have personally reviewed the available labs and imaging studies at the time of the admission.  Pertinent labs:    Glucose 121 WBC 6.4 Hgb 13.3 Platelets 92    Assessment and Plan: Principal Problem:   Hip fracture (HCC) Active Problems:   Depression   Dementia (HCC)   Tobacco abuse   Malignant neoplasm of prostate (HCC)   Gout   Alcohol abuse    Hip fracture Apparently mechanical fall resulting in hip fracture Orthopedics consulted NPO in anticipation of surgical repair today SCDs  Pain control with Tylenol , Robaxin , Oxycodone , and Dilaudid  prn Will need PT consult post-operatively Hip fracture order set utilized TXA per orthopedics  Pre-operative stratification Orthopedic/spinal surgery is associated with an intermediate (1-5%) cardiovascular  risk for cardiac death and nonfatal MI No known CVD RF that would interfere with surgery Given his moderate functional capacity, it is reasonable for him to go to the OR without additional evaluation  Pubic ramus fracture Possibly minimally displaced fracture of inferior L pubis ramus This will need conservative treatment only  ETOH abuse Patient with chronic ETOH dependence Number of drinks per day: 2-6 beers CIWA protocol Folate, thiamine , and MVI ordered Will provide symptom-triggered BZD (ativan per CIWA protocol) only since the patient is able to communicate; is not showing current signs of delirium; and has no history of severe withdrawal.  Thrombocytopenia Due to bone marrow suppression from alcohol abuse Monitor daily platelet count   HTN Appears to no longer be taking amlodipine  However, BP is 154/79 and he appears to need medication Will add prn hydralazine for now  HLD Continue  rosuvastatin   Peripheral neuropathy Recently changed to pregabalin  Prostate CA Remote h/o surgery and radiation PSA 0.08 in 2022 There is no evidence that this was a pathologic fracture at this time Will check PSA  Depression/Dementia Continue trazodone  Delirium precautions  Gout Continue allopurinol   Tobacco dependence Encourage cessation.   Patch ordered     Advance Care Planning:   Code Status: Full Code - Code status was discussed with the patient and/or family at the time of admission.  The patient would want to receive full resuscitative measures at this time.   Consults: Orthopedics; nutrition; TOC team; will need PT/OT post-operatively  DVT Prophylaxis: SCDs  Family Communication: None present; I spoke with his wife by telephone  Severity of Illness: The appropriate patient status for this patient is INPATIENT. Inpatient status is judged to be reasonable and necessary in order to provide the required intensity of service to ensure the patient's safety. The patient's presenting symptoms, physical exam findings, and initial radiographic and laboratory data in the context of their chronic comorbidities is felt to place them at high risk for further clinical deterioration. Furthermore, it is not anticipated that the patient will be medically stable for discharge from the hospital within 2 midnights of admission.   * I certify that at the point of admission it is my clinical judgment that the patient will require inpatient hospital care spanning beyond 2 midnights from the point of admission due to high intensity of service, high risk for further deterioration and high frequency of surveillance required.*  Author: Delon Herald, MD 09/22/2023 11:30 AM  For on call review [email or web address].

## 2023-09-22 NOTE — Anesthesia Preprocedure Evaluation (Signed)
 Anesthesia Evaluation  Patient identified by MRN, date of birth, ID band Patient awake    Reviewed: Allergy & Precautions, NPO status , Patient's Chart, lab work & pertinent test results  History of Anesthesia Complications Negative for: history of anesthetic complications  Airway Mallampati: III  TM Distance: >3 FB Neck ROM: full    Dental  (+) Chipped, Poor Dentition, Missing, Upper Dentures   Pulmonary neg shortness of breath, COPD, Patient abstained from smoking., former smoker    + decreased breath sounds      Cardiovascular Exercise Tolerance: Good hypertension, (-) angina + CAD  (-) Past MI Normal cardiovascular exam     Neuro/Psych  PSYCHIATRIC DISORDERS      negative neurological ROS     GI/Hepatic negative GI ROS, Neg liver ROS,neg GERD  ,,  Endo/Other  negative endocrine ROS    Renal/GU      Musculoskeletal   Abdominal   Peds  Hematology negative hematology ROS (+)   Anesthesia Other Findings Past Medical History: No date: Anxiety No date: Arthritis No date: Chicken pox No date: Dementia (HCC) No date: Depression No date: Gout No date: Hypertension 08/06/2015: Personal history of tobacco use, presenting hazards to  health No date: Prostate cancer (HCC) No date: Testicular cyst     Comment:  Dr. Ike  Past Surgical History: 04/21/2020: CATARACT EXTRACTION W/PHACO; Right     Comment:  Procedure: CATARACT EXTRACTION PHACO AND INTRAOCULAR               LENS PLACEMENT (IOC) RIGHT 5.74 01:01.7 9.3%;  Surgeon:               Mittie Gaskin, MD;  [facility];              Service: Ophthalmology;  Laterality: Right; 05/05/2020: CATARACT EXTRACTION W/PHACO; Left     Comment:  Procedure: CATARACT EXTRACTION PHACO AND INTRAOCULAR               LENS PLACEMENT (IOC) LEFT 5.05 01:01.5 802%;  Surgeon:               Mittie Gaskin, MD;  Location: [facility];               Service: Ophthalmology;  Laterality: Left; 10/08/2015: GANGLION CYST EXCISION; Right     Comment:  Procedure: REMOVAL GANGLION CYST FOOT/2 right foot;                Surgeon: Krystal Rosella, DPM;  Location: [facility];  Service:               Podiatry;  Laterality: Right; No date: KNEE ARTHROSCOPY; Right     Comment:  Dr. Claudene, [facility] 04/18/2019: LOWER EXTREMITY ANGIOGRAPHY; Left     Comment:  Procedure: LOWER EXTREMITY ANGIOGRAPHY;  Surgeon:               Jama Cordella MATSU, MD;  Location: [facility];               Service: Cardiovascular;  Laterality: Left; No date: NASAL SINUS SURGERY     Comment:  Dr. Lum Gaskins, [facility]     Reproductive/Obstetrics negative OB ROS                             Anesthesia Physical Anesthesia Plan  ASA: 2  Anesthesia Plan: General ETT   Post-op Pain Management:    Induction: Intravenous  PONV Risk Score and Plan: Ondansetron , Dexamethasone , Midazolam  and Treatment may vary due to age or medical condition  Airway Management Planned: Oral ETT  Additional Equipment:   Intra-op Plan:   Post-operative Plan: Extubation in OR  Informed Consent: I have reviewed the patients History and Physical, chart, labs and discussed the procedure including the risks, benefits and alternatives for the proposed anesthesia with the patient or authorized representative who has indicated his/her understanding and acceptance.     Dental Advisory Given  Plan Discussed with: Anesthesiologist, CRNA and Surgeon  Anesthesia Plan Comments: (History and phone consent from the patients wife Shivan Hodes at [phone]  Patient and wife consented for risks of anesthesia including but not limited to:  - adverse reactions to medications - damage to eyes, teeth, lips or other oral mucosa - nerve damage due to positioning  - sore throat or hoarseness - Damage to heart, brain, nerves, lungs, other parts of body or loss of life  They voiced  understanding and assent.)       Anesthesia Quick Evaluation

## 2023-09-22 NOTE — Anesthesia Postprocedure Evaluation (Signed)
 Anesthesia Post Note  Patient: MELO STAUBER  Procedure(s) Performed: FIXATION, FRACTURE, INTERTROCHANTERIC, WITH INTRAMEDULLARY ROD (Left: Hip)  Patient location during evaluation: PACU Anesthesia Type: General Level of consciousness: awake and alert Pain management: pain level controlled Vital Signs Assessment: post-procedure vital signs reviewed and stable Respiratory status: spontaneous breathing, nonlabored ventilation and respiratory function stable Cardiovascular status: blood pressure returned to baseline and stable Postop Assessment: no apparent nausea or vomiting Anesthetic complications: no   No notable events documented.   Last Vitals:  Vitals:   09/22/23 1829 09/22/23 1935  BP: (!) 108/56 (!) 109/55  Pulse: 61 70  Resp: 16 18  Temp: (!) 36.4 C (!) 36.3 C  SpO2: 97% 97%    Last Pain:  Vitals:   09/22/23 1806  TempSrc:   PainSc: 0-No pain                 Fairy POUR Deaveon Schoen

## 2023-09-22 NOTE — Op Note (Signed)
 DATE OF SURGERY:  09/22/2023  TIME: 4:59 PM  PATIENT NAME:  Ian Ross  AGE: 78 y.o.  PRE-OPERATIVE DIAGNOSIS: Comminuted displaced intertrochanteric left hip fracture  POST-OPERATIVE DIAGNOSIS:  SAME  PROCEDURE:  FIXATION, FRACTURE, left comminuted, displaced INTERTROCHANTERIC femur,  WITH INTRAMEDULLARY ROD  SURGEON:  Kayla FORBES Pinal  ASST:  EBL: 150 cc  COMPLICATIONS: None  OPERATIVE IMPLANTS: Synthes trochanteric femoral nail 130 degree / 10 mm with interlocking helical blade 100 mm and distal locking screw 44 mm.  PREOPERATIVE INDICATIONS:  [name] is a 78 y.o. year old who fell and suffered a hip fracture. He was brought into the ER and then admitted and optimized and then elected for surgical intervention.    The risks benefits and alternatives were discussed with the patient including but not limited to the risks of nonoperative treatment, versus surgical intervention including infection, bleeding, nerve injury, malunion, nonunion, hardware prominence, hardware failure, need for hardware removal, blood clots, cardiopulmonary complications, morbidity, mortality, among others, and they were willing to proceed.    OPERATIVE PROCEDURE:  The patient was brought to the operating room and placed in the supine position.  General endotracheal anesthesia was administered, with out a foley. He was placed on the fracture table.  Closed reduction was performed under C-arm guidance. The length of the femur was also measured using fluoroscopy. Time out was then performed after sterile prep and drape. He received preoperative antibiotics.  Incision was made proximal to the greater trochanter. A guidewire was placed in the appropriate position. Confirmation was made on AP and lateral views. The above-named nail was opened. I opened the proximal femur with a reamer. I then placed the nail by hand easily down. I did not need to ream the femur.  Once the nail was completely  seated, I placed a guidepin into the femoral head into the center center position through a second incision.  I measured the length, and then reamed the lateral cortex and up into the head. I then placed the helical blade. Slight compression was applied.  Stable fixation was achieved. Bone quality was mediocre.  I then secured the proximal interlock.  The distal locking screw was then placed and after confirming the position of the fracture fragments and hardware I then removed the instruments, and took final C-arm pictures AP and lateral the entire length of the leg. Anatomic reconstruction was achieved, and the wounds were irrigated copiously and closed with Vicryl  followed by staples and dry sterile dressing. Sponge and needle count were correct.   The patient was awakened and returned to PACU in stable and satisfactory condition. There no complications and the patient tolerated the procedure well.  He will be partial weightbearing as tolerated, and will be on Lovenox  For DVT prophylaxis.     Kayla FORBES Pinal, M.D.

## 2023-09-22 NOTE — H&P (Signed)
THE PATIENT WAS SEEN PRIOR TO SURGERY TODAY.  HISTORY, ALLERGIES, HOME MEDICATIONS AND OPERATIVE PROCEDURE WERE REVIEWED. RISKS AND BENEFITS OF SURGERY DISCUSSED WITH PATIENT AGAIN.  NO CHANGES FROM INITIAL HISTORY AND PHYSICAL NOTED.    

## 2023-09-22 NOTE — Transfer of Care (Signed)
 Immediate Anesthesia Transfer of Care Note  Patient: Ian Ross  Procedure(s) Performed: FIXATION, FRACTURE, INTERTROCHANTERIC, WITH INTRAMEDULLARY ROD (Left: Hip)  Patient Location: PACU  Anesthesia Type:General  Level of Consciousness: drowsy  Airway & Oxygen Therapy: Patient Spontanous Breathing and Patient connected to face mask oxygen  Post-op Assessment: Report given to RN and Post -op Vital signs reviewed and stable  Post vital signs: Reviewed and stable  Last Vitals:  Vitals Value Taken Time  BP 109/46 09/22/23 17:03  Temp    Pulse 69 09/22/23 17:09  Resp 9 09/22/23 17:09  SpO2 100 % 09/22/23 17:09  Vitals shown include unfiled device data.  Last Pain:  Vitals:   09/22/23 1254  TempSrc:   PainSc: Asleep         Complications: No notable events documented.

## 2023-09-22 NOTE — ED Provider Notes (Signed)
 [facility] Provider Note    Event Date/Time   First MD Initiated Contact with Patient 09/22/23 [phone]     (approximate)   History   Chief Complaint Fall   HPI  Ian Ross is a 78 y.o. male with past medical history of hypertension, gout, and dementia presents to the ED following fall.  Patient reports that he was attempting to tighten a bolt this morning when the bolt broke, causing him to fall and strike his left hip on concrete.  He denies hitting his head or losing consciousness, has been unable to ambulate since the fall due to left hip pain.  He denies any other areas of pain, does not take a blood thinner.     Physical Exam   Triage Vital Signs: ED Triage Vitals [09/22/23 0857]  Encounter Vitals Group     BP      Girls Systolic BP Percentile      Girls Diastolic BP Percentile      Boys Systolic BP Percentile      Boys Diastolic BP Percentile      Pulse      Resp      Temp      Temp src      SpO2      Weight      Height      Head Circumference      Peak Flow      Pain Score 6     Pain Loc      Pain Education      Exclude from Growth Chart     Most recent vital signs: Vitals:   09/22/23 0850  BP: (!) 154/79  Pulse: 72  Resp: 15  Temp: 97.7 F (36.5 C)  SpO2: 97%    Constitutional: Alert and oriented. Eyes: Conjunctivae are normal. Head: Atraumatic. Nose: No congestion/rhinnorhea. Mouth/Throat: Mucous membranes are moist.  Neck: No midline cervical spine tenderness to palpation. Cardiovascular: Normal rate, regular rhythm. Grossly normal heart sounds.  2+ radial and DP pulses bilaterally. Respiratory: Normal respiratory effort.  No retractions. Lungs CTAB.  No chest wall tenderness to palpation. Gastrointestinal: Soft and nontender. No distention. Musculoskeletal: Diffuse tenderness to palpation of left hip with shortening and external rotation.  No tenderness to palpation at right hip, bilateral knees, or bilateral  ankles.  No upper extremity bony tenderness to palpation. Neurologic:  Normal speech and language. No gross focal neurologic deficits are appreciated.    ED Results / Procedures / Treatments   Labs (all labs ordered are listed, but only abnormal results are displayed) Labs Reviewed  CBC WITH DIFFERENTIAL/PLATELET - Abnormal; Notable for the following components:      Result Value   RBC 3.66 (*)    MCV 107.7 (*)    MCH 36.3 (*)    Platelets 92 (*)    All other components within normal limits  BASIC METABOLIC PANEL WITH GFR - Abnormal; Notable for the following components:   Glucose, Bld 121 (*)    Calcium  8.6 (*)    All other components within normal limits  PROTIME-INR  URINALYSIS, ROUTINE W REFLEX MICROSCOPIC  MAGNESIUM  TYPE AND SCREEN     EKG  ED ECG REPORT I, Carlin Palin, the attending physician, personally viewed and interpreted this ECG.   Date: 09/22/2023  EKG Time: 9:55  Rate: 65  Rhythm: normal sinus rhythm  Axis: Normal  Intervals:none  ST&T Change: None  RADIOLOGY Left hip x-ray reviewed and interpreted by  me with intertrochanteric fracture as well as possible inferior pubic rami fracture.  PROCEDURES:  Critical Care performed: No  Procedures   MEDICATIONS ORDERED IN ED: Medications  0.9 %  sodium chloride  infusion (has no administration in time range)  ceFAZolin  (ANCEF ) IVPB 2g/100 mL premix (has no administration in time range)  chlorhexidine (HIBICLENS) 4 % liquid (has no administration in time range)  morphine  (PF) 2 MG/ML injection 1 mg (has no administration in time range)  tranexamic acid  (CYKLOKAPRON ) IVPB 1,000 mg (has no administration in time range)  morphine  (PF) 4 MG/ML injection 4 mg (4 mg Intravenous Given 09/22/23 0937)  ondansetron  (ZOFRAN ) injection 4 mg (4 mg Intravenous Given 09/22/23 0937)  HYDROmorphone  (DILAUDID ) injection 0.5 mg (0.5 mg Intravenous Given 09/22/23 1010)     IMPRESSION / MDM / ASSESSMENT AND PLAN / ED  COURSE  I reviewed the triage vital signs and the nursing notes.                              78 y.o. male with past medical history of hypertension, gout, and dementia who presents to the ED complaining of fall onto his left hip.  Patient's presentation is most consistent with acute presentation with potential threat to life or bodily function.  Differential diagnosis includes, but is not limited to, fracture, dislocation, contusion, neurovascular compromise.  Patient nontoxic-appearing and in no acute distress, vital signs are unremarkable.  He has obvious deformity to his left hip but no other injuries noted on exam.  He is neurovascularly intact distally, x-ray shows intertrochanteric fracture of the hip as well as fracture of the inferior pubic rami.  Patient treated with IV morphine  and Zofran , chest x-ray is unremarkable, labs are pending.  Plan to discuss with orthopedics.  Case with Dr. Cleotilde of orthopedics, who does not feel CT imaging of the hip is needed at this time.  He recommends keeping the patient n.p.o. for possible operative repair this afternoon or evening.  Labs without significant anemia, leukocytosis, electrolyte abnormality, or AKI.  Patient given additional IV pain medication and case discussed with hospitalist for admission.      FINAL CLINICAL IMPRESSION(S) / ED DIAGNOSES   Final diagnoses:  Closed fracture of left hip, initial encounter [facility])     Rx / DC Orders   ED Discharge Orders     None        Note:  This document was prepared using Dragon voice recognition software and may include unintentional dictation errors.   Willo Dunnings, MD 09/22/23 1047

## 2023-09-22 NOTE — ED Triage Notes (Signed)
 Pt coming from work via [employer] with reports of ground level fall. Pt reports while tightening a bolt the bolt broke and he fell onto the concrete. Pt has shortening and rotation of the left hip. Pulses present distal to injury.

## 2023-09-22 NOTE — Consult Note (Signed)
 ORTHOPAEDIC CONSULTATION  REQUESTING PHYSICIAN: Barbarann Nest, MD  Chief Complaint: Left hip pain  HPI: Ian Ross is a 78 y.o. male who complains of left hip pain after falling earlier today while attempting to tighten a bolt on a large piece of machinery.  The boat gave way and he fell injuring the left hip.  He was brought to the emergency room where exam and x-rays revealed a comminuted intertrochanteric fracture of the left hip.  Patient has been admitted for surgical fixation of the fracture.  Risks and benefits of surgery were discussed with the patient.  Plan would be to use a trochanteric fixation nail to fix this.  He has been cleared by the medical service for surgery today.  Past Medical History:  Diagnosis Date   Anxiety    Arthritis    Chicken pox    Dementia (HCC)    Depression    Gout    Hypertension    Personal history of tobacco use, presenting hazards to health 08/06/2015   Prostate cancer [facility])    Testicular cyst    Dr. Ike   Past Surgical History:  Procedure Laterality Date   CATARACT EXTRACTION W/PHACO Right 04/21/2020   Procedure: CATARACT EXTRACTION PHACO AND INTRAOCULAR LENS PLACEMENT (IOC) RIGHT 5.74 01:01.7 9.3%;  Surgeon: Mittie Gaskin, MD;  Location: [facility];  Service: Ophthalmology;  Laterality: Right;   CATARACT EXTRACTION W/PHACO Left 05/05/2020   Procedure: CATARACT EXTRACTION PHACO AND INTRAOCULAR LENS PLACEMENT (IOC) LEFT 5.05 01:01.5 802%;  Surgeon: Mittie Gaskin, MD;  Location: [facility];  Service: Ophthalmology;  Laterality: Left;   GANGLION CYST EXCISION Right 10/08/2015   Procedure: REMOVAL GANGLION CYST FOOT/2 right foot;  Surgeon: Krystal Rosella, DPM;  Location: [facility];  Service: Podiatry;  Laterality: Right;   KNEE ARTHROSCOPY Right    Dr. Claudene, [facility]   LOWER EXTREMITY ANGIOGRAPHY Left 04/18/2019   Procedure: LOWER EXTREMITY ANGIOGRAPHY;  Surgeon: Jama Cordella MATSU, MD;  Location: [facility];   Service: Cardiovascular;  Laterality: Left;   NASAL SINUS SURGERY     Dr. Lum Gaskins, [facility]   Social History   Socioeconomic History   Marital status: Married    Spouse name: Not on file   Number of children: Not on file   Years of education: Not on file   Highest education level: Not on file  Occupational History   Not on file  Tobacco Use   Smoking status: Former    Current packs/day: 0.50    Average packs/day: 0.5 packs/day for 60.0 years (30.0 ttl pk-yrs)    Types: Cigarettes   Smokeless tobacco: Current    Types: Chew   Tobacco comments:    chews tobacco. started smoking age 89  Vaping Use   Vaping status: Never Used  Substance and Sexual Activity   Alcohol use: Yes    Alcohol/week: 2.0 standard drinks of alcohol    Types: 2 Cans of beer per week    Comment: daily   Drug use: No   Sexual activity: Never  Other Topics Concern   Not on file  Social History Narrative   Lives in [place], married, has 2 children (boys), 4 grandchildren.      Work - retired Holiday representative   Social Drivers of Corporate investment banker Strain: Low Risk  (05/28/2023)   Received from [facility]   Overall Financial Resource Strain (CARDIA)    Difficulty of Paying Living Expenses: Not hard at all  Food Insecurity: No Food Insecurity (05/28/2023)   Received from [facility]   Hunger Vital Sign    Within the past 12 months, you worried that your food would run out before you got the money to buy more.: Never true    Within the past 12 months, the food you bought just didn't last and you didn't have money to get more.: Never true  Transportation Needs: No Transportation Needs (05/28/2023)   Received from [facility] - Transportation    In the past 12 months, has lack of transportation kept you from medical appointments or from getting medications?: No    Lack of Transportation (Non-Medical): No  Physical Activity: Not on  file  Stress: Not on file  Social Connections: Not on file   Family History  Problem Relation Age of Onset   Cancer Mother        breast   Heart disease Mother    Cancer Father        prostate   Heart disease Father    Breast cancer Neg Hx    Allergies  Allergen Reactions   Codeine Nausea Only, Anxiety and Other (See Comments)   Prior to Admission medications   Medication Sig Start Date End Date Taking? Authorizing Provider  allopurinol  (ZYLOPRIM ) 300 MG tablet Take 600 mg by mouth daily. 12/16/18  Yes [provider]  aspirin 81 MG chewable tablet Chew 81 mg by mouth daily.   Yes [provider]  pregabalin (LYRICA) 100 MG capsule Take 100 mg by mouth 2 (two) times daily. 08/24/23  Yes [provider]  rosuvastatin  (CRESTOR ) 10 MG tablet Take 10 mg by mouth daily. 08/21/23  Yes [provider]  traZODone  (DESYREL ) 50 MG tablet Take 50 mg by mouth at bedtime. 09/17/23  Yes [provider]  vitamin B-12 (CYANOCOBALAMIN ) 500 MCG tablet Take 500 mcg by mouth daily.   Yes [provider]  amLODipine  (NORVASC ) 5 MG tablet Take 1 tablet (5 mg total) by mouth daily. 04/18/19 04/17/20  Schnier, Cordella MATSU, MD   DG Chest 1 View Result Date: 09/22/2023 CLINICAL DATA:  Status post fall with left femoral fracture EXAM: CHEST  1 VIEW COMPARISON:  CT chest dated 07/23/2023 FINDINGS: Normal lung volumes. No focal consolidations. No pleural effusion or pneumothorax. The heart size and mediastinal contours are within normal limits. No acute osseous abnormality. IMPRESSION: No acute disease. Electronically Signed   By: Limin  Xu M.D.   On: 09/22/2023 09:36   DG Hip Unilat W or Wo Pelvis 2-3 Views Left Result Date: 09/22/2023 CLINICAL DATA:  Status post fall with left leg deformity EXAM: DG HIP (WITH OR WITHOUT PELVIS) 3V LEFT COMPARISON:  None Available. FINDINGS: Comminuted left intertrochanteric fracture with varus angulation. Anatomic alignment of the  left femoroacetabular joint. Possible minimally displaced fracture of the inferior left pubic ramus. Radiopaque prostate fiducials project over the midline inferior pelvis. IMPRESSION: 1. Comminuted left intertrochanteric fracture with varus angulation. 2. Possible minimally displaced fracture of the inferior left pubic ramus. Electronically Signed   By: Limin  Xu M.D.   On: 09/22/2023 09:35    Positive ROS: All other systems have been reviewed and were otherwise negative with the exception of those mentioned in the HPI and as above.  Physical Exam: General: Alert, no acute distress Cardiovascular: No pedal edema Respiratory: No cyanosis, no use of accessory musculature GI: No organomegaly, abdomen is soft and non-tender Skin: No lesions in the  area of chief complaint Neurologic: Sensation intact distally Psychiatric: Patient is competent for consent with normal mood and affect Lymphatic: No axillary or cervical lymphadenopathy  MUSCULOSKELETAL: Patient is awake and alert and oriented.  His left leg is shortened and externally rotated.  There was some swelling in the soft tissues.  The skin is intact.  Neurovascular status is intact distally.  Right leg is normal to exam.  Both elbows show mild abrasions.  Full motion of the elbows and shoulders in both arms.  Assessment: Comminuted intratrochanteric fracture left hip  Plan: ORIF left hip fracture with trochanteric fixation nail    Kayla FORBES Pinal, MD [phone]   09/22/2023 1:04 PM

## 2023-09-22 NOTE — TOC Initial Note (Signed)
 Transition of Care [facility]) - Initial/Assessment Note    Patient Details  Name: Ian Ross MRN: [id number] Date of Birth: 1945/10/07  Transition of Care [facility]) CM/SW Contact:    [place] L Leilanee Righetti, LCSW Phone Number: 09/22/2023, 11:47 AM  Clinical Narrative:                  Chart reviewed. Pt headed to the OR. Pt is now inpatient and will be headed to a floor.        Patient Goals and CMS Choice            Expected Discharge Plan and Services                                              Prior Living Arrangements/Services                       Activities of Daily Living      Permission Sought/Granted                  Emotional Assessment              Admission diagnosis:  Hip fracture (HCC) [S72.009A] Patient Active Problem List   Diagnosis Date Noted   Hip fracture (HCC) 09/22/2023   Gout 09/22/2023   Alcohol abuse 09/22/2023   Painful and cold lower extremity 04/15/2019   Atherosclerosis of native arteries of extremity with rest pain (HCC) 04/14/2019   Prominent popliteal pulse 04/14/2019   Solitary pulmonary nodule 09/08/2015   Atherosclerosis 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 09/08/2015   Personal history of tobacco use, presenting hazards to health 08/06/2015   Routine general medical examination at a health care facility 05/05/2015   Ganglion cyst 04/26/2015   Malignant neoplasm of prostate (HCC) 12/24/2014   Insomnia 12/24/2014   Tobacco abuse 05/20/2014   Medicare annual wellness visit, subsequent 04/18/2012   B12 deficiency 01/03/2012   Depression 12/06/2011   Dementia (HCC) 12/06/2011   Neuropathy (HCC) 12/06/2011   PCP:  Marikay Eva POUR, PA Pharmacy:   [id number] [place], [place] - [address] AT [facility] [address] [place] [address] Phone: [phone] Fax: [phone]     Social Drivers of Health  (SDOH) Social History: SDOH Screenings   Food Insecurity: No Food Insecurity (05/28/2023)   Received from [facility]  Housing: Unknown (05/28/2023)   Received from [facility]  Transportation Needs: No Transportation Needs (05/28/2023)   Received from [facility]  Utilities: Not At Risk (05/28/2023)   Received from [facility]  Financial Resource Strain: Low Risk  (05/28/2023)   Received from [facility]  Tobacco Use: High Risk (09/22/2023)   SDOH Interventions:     Readmission Risk Interventions     No data to display

## 2023-09-22 NOTE — Anesthesia Procedure Notes (Signed)
 Procedure Name: Intubation Date/Time: 09/22/2023 3:27 PM  Performed by: Delores Evalene BROCKS, CRNAPre-anesthesia Checklist: Patient identified, Patient being monitored, Timeout performed, Emergency Drugs available and Suction available Patient Re-evaluated:Patient Re-evaluated prior to induction Oxygen Delivery Method: Circle system utilized Preoxygenation: Pre-oxygenation with 100% oxygen Induction Type: IV induction Ventilation: Mask ventilation without difficulty and Oral airway inserted - appropriate to patient size Laryngoscope Size: Mac and 3 Grade View: Grade I Tube type: Oral Tube size: 7.5 mm Number of attempts: 1 Airway Equipment and Method: Stylet Placement Confirmation: ETT inserted through vocal cords under direct vision, positive ETCO2 and breath sounds checked- equal and bilateral Secured at: 21 cm Tube secured with: Tape Dental Injury: Teeth and Oropharynx as per pre-operative assessment

## 2023-09-23 DIAGNOSIS — S72002A Fracture of unspecified part of neck of left femur, initial encounter for closed fracture: Secondary | ICD-10-CM

## 2023-09-23 DIAGNOSIS — I10 Essential (primary) hypertension: Secondary | ICD-10-CM | POA: Insufficient documentation

## 2023-09-23 LAB — TECHNOLOGIST SMEAR REVIEW: Plt Morphology: DECREASED

## 2023-09-23 LAB — CBC
HCT: 30.5 % — ABNORMAL LOW (ref 39.0–52.0)
Hemoglobin: 10.4 g/dL — ABNORMAL LOW (ref 13.0–17.0)
MCH: 36.4 pg — ABNORMAL HIGH (ref 26.0–34.0)
MCHC: 34.1 g/dL (ref 30.0–36.0)
MCV: 106.6 fL — ABNORMAL HIGH (ref 80.0–100.0)
Platelets: 103 K/uL — ABNORMAL LOW (ref 150–400)
RBC: 2.86 MIL/uL — ABNORMAL LOW (ref 4.22–5.81)
RDW: 13.2 % (ref 11.5–15.5)
WBC: 7.4 K/uL (ref 4.0–10.5)
nRBC: 0 % (ref 0.0–0.2)

## 2023-09-23 LAB — CBC WITH DIFFERENTIAL/PLATELET
Abs Immature Granulocytes: 0.03 10*3/uL (ref 0.00–0.07)
Basophils Absolute: 0 10*3/uL (ref 0.0–0.1)
Basophils Relative: 0 %
Eosinophils Absolute: 0 10*3/uL (ref 0.0–0.5)
Eosinophils Relative: 1 %
HCT: 30.9 % — ABNORMAL LOW (ref 39.0–52.0)
Hemoglobin: 10.3 g/dL — ABNORMAL LOW (ref 13.0–17.0)
Immature Granulocytes: 0 %
Lymphocytes Relative: 5 %
Lymphs Abs: 0.3 10*3/uL — ABNORMAL LOW (ref 0.7–4.0)
MCH: 36.3 pg — ABNORMAL HIGH (ref 26.0–34.0)
MCHC: 33.3 g/dL (ref 30.0–36.0)
MCV: 108.8 fL — ABNORMAL HIGH (ref 80.0–100.0)
Monocytes Absolute: 0.2 10*3/uL (ref 0.1–1.0)
Monocytes Relative: 3 %
Neutro Abs: 6.6 10*3/uL (ref 1.7–7.7)
Neutrophils Relative %: 91 %
Platelets: 102 10*3/uL — ABNORMAL LOW (ref 150–400)
RBC: 2.84 MIL/uL — ABNORMAL LOW (ref 4.22–5.81)
RDW: 13.3 % (ref 11.5–15.5)
WBC: 7.3 10*3/uL (ref 4.0–10.5)
nRBC: 0 % (ref 0.0–0.2)

## 2023-09-23 LAB — BASIC METABOLIC PANEL WITH GFR
Anion gap: 7 (ref 5–15)
BUN: 14 mg/dL (ref 8–23)
CO2: 24 mmol/L (ref 22–32)
Calcium: 8.1 mg/dL — ABNORMAL LOW (ref 8.9–10.3)
Chloride: 104 mmol/L (ref 98–111)
Creatinine, Ser: 1.33 mg/dL — ABNORMAL HIGH (ref 0.61–1.24)
GFR, Estimated: 54 mL/min — ABNORMAL LOW (ref >60)
Glucose, Bld: 161 mg/dL — ABNORMAL HIGH (ref 70–99)
Potassium: 4 mmol/L (ref 3.5–5.1)
Sodium: 135 mmol/L (ref 135–145)

## 2023-09-23 MED ORDER — POLYETHYLENE GLYCOL 3350 17 G PO PACK
17.0000 g | PACK | Freq: Every day | ORAL | Status: DC
  Administered 2023-09-23 – 2023-09-25 (×3): 17 g via ORAL
  Filled 2023-09-23 (×3): qty 1

## 2023-09-23 MED ORDER — ENSURE PLUS HIGH PROTEIN PO LIQD
237.0000 mL | Freq: Two times a day (BID) | ORAL | Status: DC
  Administered 2023-09-24 – 2023-09-25 (×3): 237 mL via ORAL

## 2023-09-23 NOTE — Evaluation (Signed)
 Occupational Therapy Evaluation Patient Details Name: Ian Ross MRN: [id number] DOB: [birth year] Today's Date: 09/23/2023   History of Present Illness   Pt is an 78 y/o male s/p ORIF L hip fx with trochanteric fixation nail secondary to sustaining a mechanical fall at work. PMH including but not limited to anxiety/depression, dementia, HTN, ETOH abuse, and prostate CA.     Clinical Impressions Chart reviewed to date, pt greeted semi supine in bed, agreeable to OT evaluation. Pt is alert and oriented x4. PTA Pt is MOD I-I in ADL/IADL, amb with no AD and works in [employer]. Pt presents with deficits in strength, endurance, activity tolerance, balance, affecting safe and optimal ADL completion. Bed mobility completed with MIN A, STS with CGA-MIN A, amb with RW in room with good adherence to PWBing approx 15'. MAX A required for LB dressing. Educated pt/family re: discharge recommendations/role of OT. Pt is eager to return home but would recommend use of DME/AE and increased family assist for ADLs. Pt is left in bed, all needs met. OT will continue to follow acutely to facilitate return to PLOF.      If plan is discharge home, recommend the following:   A little help with walking and/or transfers;A little help with bathing/dressing/bathroom;Help with stairs or ramp for entrance;Assistance with cooking/housework     Functional Status Assessment   Patient has had a recent decline in their functional status and demonstrates the ability to make significant improvements in function in a reasonable and predictable amount of time.     Equipment Recommendations   BSC/3in1;Other (comment);Tub/shower seat (2WW)     Recommendations for Other Services         Precautions/Restrictions   Precautions Precautions: Fall Restrictions Weight Bearing Restrictions Per Provider Order: Yes LLE Weight Bearing Per Provider Order: Partial weight bearing LLE Partial Weight Bearing Percentage  or Pounds: 50%     Mobility Bed Mobility Overal bed mobility: Needs Assistance Bed Mobility: Supine to Sit, Sit to Supine     Supine to sit: Contact guard Sit to supine: Min assist   General bed mobility comments: for management of LLE    Transfers Overall transfer level: Needs assistance Equipment used: Rolling walker (2 wheels) Transfers: Sit to/from Stand Sit to Stand: From elevated surface, Min assist, Contact guard assist (simulated bed at home)                  Balance Overall balance assessment: Needs assistance Sitting-balance support: Feet supported Sitting balance-Leahy Scale: Good     Standing balance support: During functional activity, Single extremity supported, Bilateral upper extremity supported, Reliant on assistive device for balance Standing balance-Leahy Scale: Fair                             ADL either performed or assessed with clinical judgement   ADL Overall ADL's : Needs assistance/impaired Eating/Feeding: Set up;Sitting   Grooming: Set up               Lower Body Dressing: Maximal assistance;Sitting/lateral leans   Toilet Transfer: Minimal assistance;Rolling walker (2 wheels);Ambulation Toilet Transfer Details (indicate cue type and reason): simulated in room, good adherence to PWBing Toileting- Clothing Manipulation and Hygiene: Moderate assistance Toileting - Clothing Manipulation Details (indicate cue type and reason): anticipate     Functional mobility during ADLs: Minimal assistance;Rolling walker (2 wheels);Cueing for sequencing (approx 15' in room, good adherence to PWBing)  Vision Patient Visual Report: No change from baseline       Perception         Praxis         Pertinent Vitals/Pain Pain Assessment Pain Assessment: 0-10 Pain Score: 5  Pain Location: L hip Pain Descriptors / Indicators: Burning, Grimacing, Guarding Pain Intervention(s): Monitored during session, Repositioned,  Limited activity within patient's tolerance     Extremity/Trunk Assessment Upper Extremity Assessment Upper Extremity Assessment: Overall WFL for tasks assessed   Lower Extremity Assessment Lower Extremity Assessment: Defer to PT evaluation LLE Deficits / Details: pt with decreased strength and ROM limitations secondary to post-op pain and weakness, pt reporting sensation to light touch intact LLE: Unable to fully assess due to pain;Unable to fully assess due to immobilization (unable to fully assess due to restrictions) LLE Sensation: WNL   Cervical / Trunk Assessment Cervical / Trunk Assessment: Normal   Communication Communication Communication: No apparent difficulties   Cognition Arousal: Alert Behavior During Therapy: WFL for tasks assessed/performed Cognition: No apparent impairments                               Following commands: Intact       Cueing  General Comments   Cueing Techniques: Verbal cues;Visual cues;Gestural cues  blood drainage noted from under incision (which is covered), nurse Clesha in room to assess, reports she is aware and will change when pt is back to bed; full linen change provided from techs while pt is mobilizing   Exercises Other Exercises Other Exercises: edu pt/family re: role of OT, role of rehab, discharge recommendations, safe ADL completion with AE/DME   Shoulder Instructions      Home Living Family/patient expects to be discharged to:: Private residence Living Arrangements: Spouse/significant other Available Help at Discharge: Family;Available 24 hours/day Type of Home: House Home Access: Stairs to enter [employer] of Steps: 1 or 3 depending on which entrance Entrance Stairs-Rails: Right;Left Home Layout: One level     Bathroom Shower/Tub: Chief Strategy Officer: Handicapped height     Home Equipment: Crutches;Grab bars - tub/shower          Prior Functioning/Environment Prior  Level of Function : Independent/Modified Independent;Working/employed;Driving             Mobility Comments: pt works in [employer] representative ADLs Comments: ind    OT Problem List: Impaired balance (sitting and/or standing);Decreased activity tolerance;Decreased knowledge of use of DME or AE;Decreased knowledge of precautions   OT Treatment/Interventions: Self-care/ADL training;DME and/or AE instruction;Therapeutic activities;Balance training;Therapeutic exercise;Energy conservation;Patient/family education      OT Goals(Current goals can be found in the care plan section)   Acute Rehab OT Goals Patient Stated Goal: go home OT Goal Formulation: With patient Time For Goal Achievement: 10/07/23 Potential to Achieve Goals: Good ADL Goals Pt Will Perform Grooming: with modified independence;sitting;standing Pt Will Perform Lower Body Dressing: with modified independence;sitting/lateral leans;sit to/from stand Pt Will Transfer to Toilet: with modified independence;ambulating Pt Will Perform Toileting - Clothing Manipulation and hygiene: with modified independence;sit to/from stand;sitting/lateral leans   OT Frequency:  Min 3X/week    Co-evaluation              AM-PAC OT 6 Clicks Daily Activity     Outcome Measure Help from another person eating meals?: None Help from another person taking care of personal grooming?: None Help from another person toileting, which includes using toliet, bedpan, or urinal?:  A Little Help from another person bathing (including washing, rinsing, drying)?: A Lot Help from another person to put on and taking off regular upper body clothing?: None Help from another person to put on and taking off regular lower body clothing?: A Lot 6 Click Score: 19   End of Session Equipment Utilized During Treatment: Rolling walker (2 wheels);Gait belt Nurse Communication: Mobility status  Activity Tolerance: Patient tolerated treatment well Patient left: in  bed;with call bell/phone within reach;with bed alarm set  OT Visit Diagnosis: Other abnormalities of gait and mobility (R26.89)                Time: 8851-8783 OT Time Calculation (min): 28 min Charges:  OT General Charges $OT Visit: 1 Visit OT Evaluation $OT Eval Moderate Complexity: 1 Mod  Therisa Sheffield, OTD OTR/L  09/23/23, 12:26 PM

## 2023-09-23 NOTE — Progress Notes (Addendum)
 PROGRESS NOTE    Ian Ross  FMW:[id number] DOB: 1945/05/02 DOA: 09/22/2023 PCP: Marikay Eva POUR, PA  Outpatient Specialists: vascular    Brief Narrative:   From admission h and p  Ian Ross is a 78 y.o. male with medical history significant of anxiety/depression, dementia, HTN, and prostate CA who presented on 6/21 with a fall.  He reports that he was in his usual state of health and was fixing a road grater when the bolt slipped and he lost his footing.  He fell backwards, landing on his left hip.  Pain is suboptimally controlled at this time.  No other injuries other than mild L elbow abrasion.  He drinks daily 2-6 beers.  He also smokes.       Assessment & Plan:   Principal Problem:   Hip fracture (HCC) Active Problems:   Depression   Dementia (HCC)   Tobacco abuse   Malignant neoplasm of prostate (HCC)   Gout   Alcohol abuse   Essential hypertension  # Comminuted displaced left intertrochanteric hip fracture POD#1 from operative repair (intramedullary rod) by Dr. Cleotilde. Some serous/bloody drainage today. Hgb is appropriate - ortho to see - pt/ot consults - pain control, bowel regimen - trend hgb  # Pubic ramus fracture Management is non-operative  # Asymptomatic v-tach On tele this morning - f/u TTE - maintain tele  # AKI Mild, likely from NPO status yesterday. Now taking PO - gentle fluids  # Heavy drinking 2-4 drinks a day, denies history withdrawal - monitor  # Thrombocytopenia Mild, stable - hiv, hcv, smear  # Neuropathy - continue home pregabalin  # Hypertension BP wnl, doesn't appear currently taking meds at home - monitor  # History prostate cancer S/p surgery, radiation. PSA check here is low  # Tobacco abuse - patch   DVT prophylaxis: lovenox Code Status: full Family Communication: son Glendia updated telephonically 6/22  Level of care: Med-Surg Status is: Inpatient Remains inpatient appropriate because:  severity of illness    Consultants:  orthopedics  Procedures: orif  Antimicrobials:  Surgical ppx    Subjective: Reports mild hip pain  Objective: Vitals:   09/22/23 1935 09/22/23 2350 09/23/23 0000 09/23/23 0351  BP: (!) 109/55 (!) 105/55 (!) 105/55 (!) 112/51  Pulse: 70 68 68 66  Resp: 18 18  18   Temp: (!) 97.3 F (36.3 C) 97.6 F (36.4 C)  97.6 F (36.4 C)  TempSrc:      SpO2: 97% 97%  96%    Intake/Output Summary (Last 24 hours) at 09/23/2023 9176 Last data filed at 09/23/2023 0330 Gross per 24 hour  Intake 1550.54 ml  Output 520 ml  Net 1030.54 ml   There were no vitals filed for this visit.  Examination:  General exam: Appears calm and comfortable  Respiratory system: Clear to auscultation. Respiratory effort normal. Cardiovascular system: S1 & S2 heard, RRR.   Gastrointestinal system: Abdomen is nondistended, soft and nontender. No organomegaly or masses felt. Normal bowel sounds heard. Central nervous system: Alert and oriented. No focal neurological deficits. Extremities: dressing left hip, some blood drainage on the bed Skin: No rashes, lesions or ulcers Psychiatry: Judgement and insight appear normal. Mood & affect appropriate.     Data Reviewed: I have personally reviewed following labs and imaging studies  CBC: Recent Labs  Lab 09/22/23 0941 09/23/23 0118  WBC 6.4 7.4  NEUTROABS 5.1  --   HGB 13.3 10.4*  HCT 39.4 30.5*  MCV 107.7* 106.6*  PLT 92* 103*   Basic Metabolic Panel: Recent Labs  Lab 09/22/23 0941 09/23/23 0118  NA 136 135  K 3.8 4.0  CL 105 104  CO2 23 24  GLUCOSE 121* 161*  BUN 12 14  CREATININE 0.95 1.33*  CALCIUM  8.6* 8.1*  MG 1.9  --    GFR: CrCl cannot be calculated (Unknown ideal weight.). Liver Function Tests: No results for input(s): AST, ALT, ALKPHOS, BILITOT, PROT, ALBUMIN in the last 168 hours. No results for input(s): LIPASE, AMYLASE in the last 168 hours. No results for input(s):  AMMONIA in the last 168 hours. Coagulation Profile: Recent Labs  Lab 09/22/23 1042  INR 1.1   Cardiac Enzymes: No results for input(s): CKTOTAL, CKMB, CKMBINDEX, TROPONINI in the last 168 hours. BNP (last 3 results) No results for input(s): PROBNP in the last 8760 hours. HbA1C: No results for input(s): HGBA1C in the last 72 hours. CBG: Recent Labs  Lab 09/22/23 1331  GLUCAP 107*   Lipid Profile: No results for input(s): CHOL, HDL, LDLCALC, TRIG, CHOLHDL, LDLDIRECT in the last 72 hours. Thyroid  Function Tests: No results for input(s): TSH, T4TOTAL, FREET4, T3FREE, THYROIDAB in the last 72 hours. Anemia Panel: No results for input(s): VITAMINB12, FOLATE, FERRITIN, TIBC, IRON, RETICCTPCT in the last 72 hours. Urine analysis: No results found for: COLORURINE, APPEARANCEUR, LABSPEC, PHURINE, GLUCOSEU, HGBUR, BILIRUBINUR, KETONESUR, PROTEINUR, UROBILINOGEN, NITRITE, LEUKOCYTESUR Sepsis Labs: @LABRCNTIP (procalcitonin:4,lacticidven:4)  )No results found for this or any previous visit (from the past 240 hours).       Radiology Studies: DG HIP UNILAT WITH PELVIS 2-3 VIEWS LEFT Result Date: 09/22/2023 CLINICAL DATA:  Left hip fracture, intraoperative examination. EXAM: DG HIP (WITH OR WITHOUT PELVIS) 2-3V LEFT COMPARISON:  None Available. FINDINGS: Four fluoroscopic intraoperative radiographs demonstrate open reduction internal fixation of an intratrochanteric left hip fracture with fracture fragments in near anatomic alignment on final images. No dislocation. Left hip joint space preserved on this limited examination. Fluoroscopic images: 4 Fluoroscopic dose: 17.47 mGy Fluoroscopic time: 74.3 seconds IMPRESSION: 1. Intraoperative examination during open reduction internal fixation of an intratrochanteric left hip fracture. Electronically Signed   By: Dorethia Molt M.D.   On: 09/22/2023 20:52   DG C-Arm 1-60 Min-No  Report Result Date: 09/22/2023 Fluoroscopy was utilized by the requesting physician.  No radiographic interpretation.   DG C-Arm 1-60 Min-No Report Result Date: 09/22/2023 Fluoroscopy was utilized by the requesting physician.  No radiographic interpretation.   DG Chest 1 View Result Date: 09/22/2023 CLINICAL DATA:  Status post fall with left femoral fracture EXAM: CHEST  1 VIEW COMPARISON:  CT chest dated 07/23/2023 FINDINGS: Normal lung volumes. No focal consolidations. No pleural effusion or pneumothorax. The heart size and mediastinal contours are within normal limits. No acute osseous abnormality. IMPRESSION: No acute disease. Electronically Signed   By: Limin  Xu M.D.   On: 09/22/2023 09:36   DG Hip Unilat W or Wo Pelvis 2-3 Views Left Result Date: 09/22/2023 CLINICAL DATA:  Status post fall with left leg deformity EXAM: DG HIP (WITH OR WITHOUT PELVIS) 3V LEFT COMPARISON:  None Available. FINDINGS: Comminuted left intertrochanteric fracture with varus angulation. Anatomic alignment of the left femoroacetabular joint. Possible minimally displaced fracture of the inferior left pubic ramus. Radiopaque prostate fiducials project over the midline inferior pelvis. IMPRESSION: 1. Comminuted left intertrochanteric fracture with varus angulation. 2. Possible minimally displaced fracture of the inferior left pubic ramus. Electronically Signed   By: Limin  Xu M.D.   On: 09/22/2023 09:35  Scheduled Meds:  allopurinol   600 mg Oral Daily   enoxaparin (LOVENOX) injection  30 mg Subcutaneous Q24H   ferrous sulfate  325 mg Oral Q breakfast   folic acid  1 mg Oral Daily   multivitamin with minerals  1 tablet Oral Daily   rosuvastatin   10 mg Oral Daily   senna  1 tablet Oral BID   thiamine   100 mg Oral Daily   Or   thiamine   100 mg Intravenous Daily   traZODone   50 mg Oral QHS   Continuous Infusions:  sodium chloride  Stopped (09/22/23 2127)    ceFAZolin  (ANCEF ) IV 2 g (09/23/23 0523)      LOS: 1 day     Devaughn KATHEE Ban, MD [facility]   If 7PM-7AM, please contact night-coverage [email or web address] Password [facility] 09/23/2023, 8:23 AM

## 2023-09-23 NOTE — Progress Notes (Signed)
 Subjective: 1 Day Post-Op Procedure(s) (LRB): FIXATION, FRACTURE, INTERTROCHANTERIC, WITH INTRAMEDULLARY ROD (Left) Patient is alert and oriented.  Is fairly comfortable.  Hemoglobin is stable at 10.3 some drainage on the dressing.  Has been up and out of bed today.  Question of whether or not he will need rehab or not.  Patient reports pain as mild.  Objective:   VITALS:   Vitals:   09/23/23 0351 09/23/23 0855  BP: (!) 112/51 (!) 124/53  Pulse: 66 67  Resp: 18 16  Temp: 97.6 F (36.4 C) (!) 97.5 F (36.4 C)  SpO2: 96% 97%    Neurologically intact Dorsiflexion/Plantar flexion intact Incision: moderate drainage  LABS Recent Labs    09/22/23 0941 09/23/23 0118  HGB 13.3 10.3*  10.4*  HCT 39.4 30.9*  30.5*  WBC 6.4 7.3  7.4  PLT 92* 102*  103*    Recent Labs    09/22/23 0941 09/23/23 0118  NA 136 135  K 3.8 4.0  BUN 12 14  CREATININE 0.95 1.33*  GLUCOSE 121* 161*    Recent Labs    09/22/23 1042  INR 1.1     Assessment/Plan: 1 Day Post-Op Procedure(s) (LRB): FIXATION, FRACTURE, INTERTROCHANTERIC, WITH INTRAMEDULLARY ROD (Left)   Advance diet Up with therapy Discharge to SNF most likely. 81 mg ASA twice daily for 6 weeks after discharge Partial weightbearing left leg with walker Return my clinic in 2 weeks after discharge

## 2023-09-23 NOTE — Plan of Care (Signed)
  Problem: Education: Goal: Knowledge of General Education information will improve Description: Including pain rating scale, medication(s)/side effects and non-pharmacologic comfort measures Outcome: Progressing   Problem: Clinical Measurements: Goal: Diagnostic test results will improve Outcome: Progressing   Problem: Nutrition: Goal: Adequate nutrition will be maintained Outcome: Progressing   Problem: Coping: Goal: Level of anxiety will decrease Outcome: Progressing   Problem: Pain Managment: Goal: General experience of comfort will improve and/or be controlled Outcome: Progressing   Problem: Clinical Measurements: Goal: Postoperative complications will be avoided or minimized Outcome: Progressing   Problem: Pain Management: Goal: Pain level will decrease Outcome: Progressing

## 2023-09-23 NOTE — Progress Notes (Signed)
 Initial Nutrition Assessment  INTERVENTION:   -Ensure Plus High Protein po BID, each supplement provides 350 kcal and 20 grams of protein.   -Continue vitamin supplementation  -Needs weight for admission   NUTRITION DIAGNOSIS:   Increased nutrient needs related to hip fracture, post-op healing as evidenced by estimated needs.  GOAL:   Patient will meet greater than or equal to 90% of their needs  MONITOR:   PO intake, Supplement acceptance  REASON FOR ASSESSMENT:   Consult Hip fracture protocol  ASSESSMENT:   78 y.o. male with medical history significant of anxiety/depression, dementia, HTN, and prostate CA who presented on 6/21 with a fall. X-rays revealed a comminuted intertrochanteric fracture of the left hip.  Patient has been admitted for surgical fixation of the fracture.  Patient is s/p hip repair (intramedullary rod) yesterday 6/21. Pt now on regular diet. To aid in post-op recovery will order Ensure supplements. Pt also consumes alcohol daily  at home (2-6 beers). Currently on CIWA vitamin supplementation.  Per weight records, last weight in chart from 08/29/23: 178 lbs. Needs updated weight for admission.  Medications: Ferrous sulfate, Folic acid, Multivitamin with minerals daily, Miralax, Senokot, Thiamine   Labs reviewed: CBGs: 107   NUTRITION - FOCUSED PHYSICAL EXAM:  Unable to complete, working remotely.  Diet Order:   Diet Order             Diet regular Room service appropriate? Yes; Fluid consistency: Thin  Diet effective now                   EDUCATION NEEDS:   Not appropriate for education at this time  Skin:  Skin Assessment: Skin Integrity Issues: Skin Integrity Issues:: Incisions Incisions: left hip  Last BM:  6/20  Height:   Ht Readings from Last 1 Encounters:  07/19/21 6' 2 (1.88 m)    Weight:   Wt Readings from Last 1 Encounters:  07/19/21 104.3 kg    BMI:  There is no height or weight on file to calculate  BMI.  Estimated Nutritional Needs:   Kcal:  2000-2200  Protein:  115-125g  Fluid:  2L/day  Morna Lee, MS, RD, LDN Inpatient Clinical Dietitian Contact via Secure chat

## 2023-09-23 NOTE — Progress Notes (Signed)
 Physical Therapy Evaluation Patient Details Name: Ian Ross MRN: [id number] DOB: 07/02/1945 Today's Date: 09/23/2023  History of Present Illness  Pt is an 78 y/o male s/p ORIF L hip fx with trochanteric fixation nail secondary to sustaining a mechanical fall at work. PMH including but not limited to anxiety/depression, dementia, HTN, ETOH abuse, and prostate CA.   Clinical Impression  Pt presented supine in bed with HOB elevated, awake and willing to participate in therapy session. Pt's daughter-in-law and granddaughter present throughout session. Prior to admission, pt reported that he was independent with all functional mobility and ADLs. Pt continues to work in [employer] and drives. He lives with his wife in a single level home with 1-3 steps to enter. At the time of evaluation, pt was able to complete bed mobility with min A, transfers with CGA and ambulated a short distance in the room with use of RW and CGA for safety. PT reviewed 3/3 POSTERIOR HIP PRECAUTIONS and PWB 50% status with pt and family throughout all functional mobility tasks. Pt would continue to benefit from skilled physical therapy services at this time while admitted and after d/c to address the below listed limitations in order to improve overall safety and independence with functional mobility.         If plan is discharge home, recommend the following: A little help with walking and/or transfers;A little help with bathing/dressing/bathroom;Assist for transportation;Help with stairs or ramp for entrance   Can travel by private vehicle        Equipment Recommendations Rolling walker (2 wheels);BSC/3in1  Recommendations for Other Services       Functional Status Assessment Patient has had a recent decline in their functional status and demonstrates the ability to make significant improvements in function in a reasonable and predictable amount of time.     Precautions / Restrictions Precautions Precautions:  Fall;Posterior Hip Precaution/Restrictions Comments: PT reviewed PWB 50% and POST HIP PRECS with pt and family members throughout session, with frequent reminders, cueing and demos Restrictions Weight Bearing Restrictions Per Provider Order: Yes LLE Weight Bearing Per Provider Order: Partial weight bearing LLE Partial Weight Bearing Percentage or Pounds: 50% Other Position/Activity Restrictions: POST HIP PRECS      Mobility  Bed Mobility Overal bed mobility: Needs Assistance Bed Mobility: Supine to Sit, Sit to Supine     Supine to sit: Min assist Sit to supine: Min assist   General bed mobility comments: increased time and effort, use of bed rails, cueing for sequencing and technique, min A needed for L LE management off of and back onto bed    Transfers Overall transfer level: Needs assistance Equipment used: Rolling walker (2 wheels) Transfers: Sit to/from Stand Sit to Stand: From elevated surface, Contact guard assist           General transfer comment: bed was elevated for pt's tall stature and to assist with maintaining posterior hip precautions during transfers. PT provided demonstration and instruction for technique with use of RW, CGA for safety    Ambulation/Gait Ambulation/Gait assistance: Contact guard assist Gait Distance (Feet): 4 Feet Assistive device: Rolling walker (2 wheels) Gait Pattern/deviations: Step-to pattern, Decreased stride length, Decreased stance time - left, Decreased weight shift to left Gait velocity: decreased     General Gait Details: PT providing cueing and instruction throughout, pt able to maintain PWB L LE (very minimal WB'ing on L) with reliance on bilateral UEs on RW to offload. Pt initially hopping on R LE with RW, but then  able to progress to a step-to pattern but still maintaining very minimal weight through L LE  Stairs            Wheelchair Mobility     Tilt Bed    Modified Rankin (Stroke Patients Only)        Balance Overall balance assessment: Needs assistance Sitting-balance support: Feet supported Sitting balance-Leahy Scale: Good     Standing balance support: During functional activity, Single extremity supported, Bilateral upper extremity supported, Reliant on assistive device for balance Standing balance-Leahy Scale: Poor                               Pertinent Vitals/Pain Pain Assessment Pain Assessment: 0-10 Pain Score: 6  Pain Location: L hip and thigh Pain Descriptors / Indicators: Burning, Grimacing, Guarding Pain Intervention(s): Monitored during session, Repositioned    Home Living Family/patient expects to be discharged to:: Private residence Living Arrangements: Spouse/significant other Available Help at Discharge: Family;Available 24 hours/day Type of Home: House Home Access: Stairs to enter Entrance Stairs-Rails: Doctor, general practice of Steps: 1 or 3 depending on which entrance   Home Layout: One level Home Equipment: Crutches;Grab bars - tub/shower      Prior Function Prior Level of Function : Independent/Modified Independent;Working/employed;Driving             Mobility Comments: pt works in [employer] ADLs Comments: ind     Extremity/Trunk Assessment   Upper Extremity Assessment Upper Extremity Assessment: Overall WFL for tasks assessed;Defer to OT evaluation    Lower Extremity Assessment Lower Extremity Assessment: LLE deficits/detail LLE Deficits / Details: pt with decreased strength and ROM limitations secondary to post-op pain and weakness, pt reporting sensation to light touch intact LLE: Unable to fully assess due to pain;Unable to fully assess due to immobilization (unable to fully assess due to restrictions) LLE Sensation: WNL    Cervical / Trunk Assessment Cervical / Trunk Assessment: Normal  Communication   Communication Communication: No apparent difficulties    Cognition Arousal: Alert Behavior  During Therapy: WFL for tasks assessed/performed   PT - Cognitive impairments: Memory, Safety/Judgement                       PT - Cognition Comments: Pt with difficulties with remembering precautions, requiring frequent cueing throughout Following commands: Intact       Cueing Cueing Techniques: Verbal cues, Visual cues, Gestural cues     General Comments      Exercises General Exercises - Lower Extremity Ankle Circles/Pumps: Supine, 20 reps, Both   Assessment/Plan    PT Assessment Patient needs continued PT services  PT Problem List Decreased strength;Decreased range of motion;Decreased activity tolerance;Decreased balance;Decreased mobility;Decreased coordination;Decreased knowledge of use of DME;Decreased safety awareness;Decreased knowledge of precautions;Pain       PT Treatment Interventions DME instruction;Gait training;Stair training;Functional mobility training;Therapeutic activities;Therapeutic exercise;Balance training;Neuromuscular re-education;Patient/family education    PT Goals (Current goals can be found in the Care Plan section)  Acute Rehab PT Goals Patient Stated Goal: decrease pain and return home, return to PLOF PT Goal Formulation: With patient/family Time For Goal Achievement: 10/07/23 Potential to Achieve Goals: Good    Frequency 7X/week     Co-evaluation               AM-PAC PT 6 Clicks Mobility  Outcome Measure Help needed turning from your back to your side while in a flat bed without using bedrails?:  None Help needed moving from lying on your back to sitting on the side of a flat bed without using bedrails?: A Little Help needed moving to and from a bed to a chair (including a wheelchair)?: A Little Help needed standing up from a chair using your arms (e.g., wheelchair or bedside chair)?: A Little Help needed to walk in hospital room?: A Little Help needed climbing 3-5 steps with a railing? : A Lot 6 Click Score: 18     End of Session Equipment Utilized During Treatment: Gait belt Activity Tolerance: Patient tolerated treatment well Patient left: in bed;with call bell/phone within reach;with bed alarm set;with family/visitor present;with SCD's reapplied Nurse Communication: Mobility status;Precautions;Weight bearing status PT Visit Diagnosis: Other abnormalities of gait and mobility (R26.89);Pain Pain - Right/Left: Left Pain - part of body: Hip;Leg    Time: 9058-8976 PT Time Calculation (min) (ACUTE ONLY): 42 min   Charges:   PT Evaluation $PT Eval Moderate Complexity: 1 Mod PT Treatments $Gait Training: 8-22 mins $Therapeutic Activity: 8-22 mins PT General Charges $$ ACUTE PT VISIT: 1 Visit         Delon DELENA KLEIN, DPT  Acute Rehabilitation Services Office [phone]   Delon HERO Jeannie Mallinger 09/23/2023, 10:44 AM

## 2023-09-24 ENCOUNTER — Inpatient Hospital Stay (HOSPITAL_COMMUNITY)
Admit: 2023-09-24 | Discharge: 2023-09-24 | Disposition: A | Discharge status: Home / Self Care | Payer: Worker's Compensation | Attending: Obstetrics and Gynecology | Admitting: Obstetrics and Gynecology

## 2023-09-24 ENCOUNTER — Encounter: Payer: Self-pay | Admitting: Specialist

## 2023-09-24 ENCOUNTER — Inpatient Hospital Stay: Admit: 2023-09-24 | Payer: Worker's Compensation

## 2023-09-24 DIAGNOSIS — D62 Acute posthemorrhagic anemia: Secondary | ICD-10-CM | POA: Insufficient documentation

## 2023-09-24 DIAGNOSIS — I472 Ventricular tachycardia, unspecified: Secondary | ICD-10-CM

## 2023-09-24 DIAGNOSIS — S72002A Fracture of unspecified part of neck of left femur, initial encounter for closed fracture: Secondary | ICD-10-CM | POA: Diagnosis not present

## 2023-09-24 DIAGNOSIS — I10 Essential (primary) hypertension: Secondary | ICD-10-CM

## 2023-09-24 LAB — BASIC METABOLIC PANEL WITH GFR
Anion gap: 4 — ABNORMAL LOW (ref 5–15)
BUN: 17 mg/dL (ref 8–23)
CO2: 27 mmol/L (ref 22–32)
Calcium: 8.7 mg/dL — ABNORMAL LOW (ref 8.9–10.3)
Chloride: 104 mmol/L (ref 98–111)
Creatinine, Ser: 1.26 mg/dL — ABNORMAL HIGH (ref 0.61–1.24)
GFR, Estimated: 58 mL/min — ABNORMAL LOW (ref >60)
Glucose, Bld: 125 mg/dL — ABNORMAL HIGH (ref 70–99)
Potassium: 4.6 mmol/L (ref 3.5–5.1)
Sodium: 135 mmol/L (ref 135–145)

## 2023-09-24 LAB — CBC
HCT: 26.3 % — ABNORMAL LOW (ref 39.0–52.0)
Hemoglobin: 8.9 g/dL — ABNORMAL LOW (ref 13.0–17.0)
MCH: 36.6 pg — ABNORMAL HIGH (ref 26.0–34.0)
MCHC: 33.8 g/dL (ref 30.0–36.0)
MCV: 108.2 fL — ABNORMAL HIGH (ref 80.0–100.0)
Platelets: 100 10*3/uL — ABNORMAL LOW (ref 150–400)
RBC: 2.43 MIL/uL — ABNORMAL LOW (ref 4.22–5.81)
RDW: 13.4 % (ref 11.5–15.5)
WBC: 8.9 10*3/uL (ref 4.0–10.5)
nRBC: 0 % (ref 0.0–0.2)

## 2023-09-24 LAB — HIV ANTIBODY (ROUTINE TESTING W REFLEX): HIV Screen 4th Generation wRfx: NONREACTIVE

## 2023-09-24 NOTE — Progress Notes (Signed)
 RE: Ian Ross, Ian Ross Date of Birth: 03-25-1946 Date: 09/24/2023     To Whom It May Concern:   Please be advised that the above-named patient will require a short-term nursing home stay - anticipated 30 days or less for rehabilitation and strengthening.  The plan is for return home

## 2023-09-24 NOTE — Progress Notes (Signed)
 Occupational Therapy Treatment Patient Details Name: Ian Ross MRN: [id number] DOB: [birth year] Today's Date: 09/24/2023   History of present illness Pt is an 78 y/o male s/p ORIF L hip fx with trochanteric fixation nail secondary to sustaining a mechanical fall at work. PMH including but not limited to anxiety/depression, dementia, HTN, ETOH abuse, and prostate CA.   OT comments  Ian Ross seen for OT treatment on this date. Upon arrival to room pt seated in chair, agreeable to tx. Pt requires MAX A + RW for STS from chair due to low height and pt's tall stature; required verbal cueing for hand placement. Once standing, pt deferred ambulation to bathroom citing pain and required MOD A to ambulate to sit EOB. Pt performed lateral scoots to [facility] and required MOD A for BLE management. Will continue to follow POC. Discharge recommendation updated to reflect assistance level required for ADLs.        If plan is discharge home, recommend the following:  A lot of help with walking and/or transfers;A lot of help with bathing/dressing/bathroom;Assistance with cooking/housework;Help with stairs or ramp for entrance   Equipment Recommendations  Other (comment) (Defer to next venue of care)    Recommendations for Other Services      Precautions / Restrictions Precautions Precautions: Fall;Posterior Hip Recall of Precautions/Restrictions: Intact Restrictions Weight Bearing Restrictions Per Provider Order: Yes LLE Weight Bearing Per Provider Order: Partial weight bearing LLE Partial Weight Bearing Percentage or Pounds: 50%       Mobility Bed Mobility Overal bed mobility: Needs Assistance Bed Mobility: Sit to Supine       Sit to supine: Mod assist, HOB elevated, Used rails   General bed mobility comments: pt performed lateral scoots to [facility]    Transfers Overall transfer level: Needs assistance Equipment used: Rolling walker (2 wheels) Transfers: Sit to/from Stand, Bed to  chair/wheelchair/BSC Sit to Stand: Max assist     Step pivot transfers: Mod assist     General transfer comment: sit>stand from chair     Balance Overall balance assessment: Needs assistance Sitting-balance support: Feet supported, Bilateral upper extremity supported Sitting balance-Leahy Scale: Fair     Standing balance support: Bilateral upper extremity supported, Reliant on assistive device for balance Standing balance-Leahy Scale: Poor                             ADL either performed or assessed with clinical judgement   ADL                                              Extremity/Trunk Assessment Upper Extremity Assessment Upper Extremity Assessment: Overall WFL for tasks assessed   Lower Extremity Assessment Lower Extremity Assessment: LLE deficits/detail LLE Deficits / Details: pt with decreased strength and ROM limitations secondary to post-op pain and weakness, pt reporting sensation to light touch intact LLE: Unable to fully assess due to pain;Unable to fully assess due to immobilization        Vision       Perception     Praxis     Communication Communication Communication: No apparent difficulties   Cognition Arousal: Alert Behavior During Therapy: [facility] for tasks assessed/performed Cognition: No apparent impairments  Following commands: Intact        Cueing   Cueing Techniques: Verbal cues, Gestural cues, Tactile cues  Exercises      Shoulder Instructions       General Comments Pt with continent void & BM on toilet, requires total assist for peri hygiene.    Pertinent Vitals/ Pain       Pain Assessment Pain Assessment: 0-10 Pain Score: 10-Worst pain ever Pain Location: L hip Pain Descriptors / Indicators: Discomfort, Grimacing, Guarding, Burning Pain Intervention(s): Limited activity within patient's tolerance, Monitored during session, Repositioned (Pt deferred  pain meds and ice)  Home Living                                          Prior Functioning/Environment              Frequency  Min 2X/week        Progress Toward Goals  OT Goals(current goals can now be found in the care plan section)  Progress towards OT goals: Progressing toward goals  Acute Rehab OT Goals Patient Stated Goal: to go home OT Goal Formulation: With patient Time For Goal Achievement: 10/08/23 Potential to Achieve Goals: Good ADL Goals Pt Will Perform Grooming: with modified independence;sitting;standing Pt Will Perform Lower Body Dressing: with modified independence;sitting/lateral leans;sit to/from stand Pt Will Transfer to Toilet: with modified independence;ambulating Pt Will Perform Toileting - Clothing Manipulation and hygiene: with modified independence;sit to/from stand;sitting/lateral leans  Plan      Co-evaluation                 AM-PAC OT 6 Clicks Daily Activity     Outcome Measure   Help from another person eating meals?: None Help from another person taking care of personal grooming?: None Help from another person toileting, which includes using toliet, bedpan, or urinal?: A Lot Help from another person bathing (including washing, rinsing, drying)?: A Lot Help from another person to put on and taking off regular upper body clothing?: None Help from another person to put on and taking off regular lower body clothing?: A Lot 6 Click Score: 18    End of Session Equipment Utilized During Treatment: Rolling walker (2 wheels);Gait belt  OT Visit Diagnosis: Other abnormalities of gait and mobility (R26.89)   Activity Tolerance Patient tolerated treatment well;Patient limited by pain   Patient Left in bed;with call bell/phone within reach;with bed alarm set   Nurse Communication          Time: 8586-8561 OT Time Calculation (min): 25 min  Charges: OT General Charges $OT Visit: 1 Visit OT  Treatments $Therapeutic Activity: 23-37 mins  [place], Student OT   [employer] 09/24/2023, 4:08 PM

## 2023-09-24 NOTE — TOC Initial Note (Signed)
 Transition of Care [facility]) - Initial/Assessment Note    Patient Details  Name: Ian Ross MRN: [id number] Date of Birth: November 14, 1945  [facility]) CM/SW Contact:    Quintella Suzen Jansky, RN Phone Number: 09/24/2023, 2:37 PM  Clinical Narrative:                 DONALD COMA, and bed search completed. Patient pending bed offers.  Expected Discharge Plan: Skilled Nursing Facility Barriers to Discharge: Continued Medical Work up   Patient Goals and CMS Choice            Expected Discharge Plan and Services     Post Acute Care Choice: Skilled Nursing Facility Living arrangements for the past 2 months: Single Family Home                                      Prior Living Arrangements/Services Living arrangements for the past 2 months: Single Family Home Lives with:: Spouse Patient language and need for interpreter reviewed:: Yes Do you feel safe going back to the place where you live?: Yes      Need for Family Participation in Patient Care: Yes (Comment) Care giver support system in place?: Yes (comment) Current home services: DME (Crutches;Grab bars - tub/shower) Criminal Activity/Legal Involvement Pertinent to Current Situation/Hospitalization: No - Comment as needed  Activities of Daily Living      Permission Sought/Granted                  Emotional Assessment Appearance:: Appears stated age Attitude/Demeanor/Rapport: Engaged Affect (typically observed): Accepting Orientation: : Oriented to Self, Oriented to Place, Oriented to  Time, Oriented to Situation Alcohol / Substance Use: Not Applicable Psych Involvement: No (comment)  Admission diagnosis:  Hip fracture (HCC) [S72.009A] Closed fracture of left hip, initial encounter (HCC) [S72.002A] Patient Active Problem List   Diagnosis Date Noted   Acute postoperative anemia due to expected blood loss 09/24/2023   Essential hypertension 09/23/2023   Hip fracture (HCC) 09/22/2023    Gout 09/22/2023   Alcohol abuse 09/22/2023   Painful and cold lower extremity 04/15/2019   Atherosclerosis of native arteries of extremity with rest pain (HCC) 04/14/2019   Prominent popliteal pulse 04/14/2019   Solitary pulmonary nodule 09/08/2015   Atherosclerosis 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 09/08/2015   Personal history of tobacco use, presenting hazards to health 08/06/2015   Routine general medical examination at a health care facility 05/05/2015   Ganglion cyst 04/26/2015   Malignant neoplasm of prostate (HCC) 12/24/2014   Insomnia 12/24/2014   Tobacco abuse 05/20/2014   Medicare annual wellness visit, subsequent 04/18/2012   B12 deficiency 01/03/2012   Depression 12/06/2011   Dementia (HCC) 12/06/2011   Neuropathy (HCC) 12/06/2011   PCP:  Marikay Eva POUR, PA Pharmacy:   [id number] [address], [place] - [address] AT [facility] OF [address] [place] [address] Phone: [phone] Fax: [phone]     Social Drivers of Health (SDOH) Social History: SDOH Screenings   Food Insecurity: No Food Insecurity (09/22/2023)  Housing: Unknown (09/22/2023)  Transportation Needs: No Transportation Needs (09/22/2023)  Utilities: Not At Risk (09/22/2023)  Financial Resource Strain: Low Risk  (05/28/2023)   Received from [facility]  Social Connections: Unknown (09/22/2023)  Tobacco Use: High Risk (09/22/2023)   SDOH Interventions:  Readmission Risk Interventions     No data to display

## 2023-09-24 NOTE — TOC Progression Note (Signed)
 Transition of Care [facility]) - Progression Note    Patient Details  Name: Ian Ross MRN: [id number] Date of Birth: 08/23/1945  Transition of Care [facility]) CM/SW Contact  Quintella Suzen Jansky, RN Phone Number: 09/24/2023, 4:21 PM  Clinical Narrative:     Contacted son, Glendia. Provided him update on process. TOC explained that patient is pending bed offer, once patient receives bed offers will provide list to patient. If [facility] offers patient bed, TOC will select facility and start auth process. He verbalized understanding.   Expected Discharge Plan: Skilled Nursing Facility Barriers to Discharge: Continued Medical Work up  Expected Discharge Plan and Services     Post Acute Care Choice: Skilled Nursing Facility Living arrangements for the past 2 months: Single Family Home                                       Social Determinants of Health (SDOH) Interventions SDOH Screenings   Food Insecurity: No Food Insecurity (09/22/2023)  Housing: Unknown (09/22/2023)  Transportation Needs: No Transportation Needs (09/22/2023)  Utilities: Not At Risk (09/22/2023)  Financial Resource Strain: Low Risk  (05/28/2023)   Received from [facility]  Social Connections: Unknown (09/22/2023)  Tobacco Use: High Risk (09/22/2023)    Readmission Risk Interventions     No data to display

## 2023-09-24 NOTE — Progress Notes (Signed)
 PROGRESS NOTE    Ian Ross  FMW:[id number] DOB: 1945/10/18 DOA: 09/22/2023 PCP: Marikay Eva POUR, PA  Outpatient Specialists: vascular    Brief Narrative:   From admission h and p  Ian Ross is a 78 y.o. male with medical history significant of anxiety/depression, dementia, HTN, and prostate CA who presented on 6/21 with a fall.  He reports that he was in his usual state of health and was fixing a road grater when the bolt slipped and he lost his footing.  He fell backwards, landing on his left hip.  Pain is suboptimally controlled at this time.  No other injuries other than mild L elbow abrasion.  He drinks daily 2-6 beers.  He also smokes.       Assessment & Plan:   Principal Problem:   Hip fracture (HCC) Active Problems:   Depression   Dementia (HCC)   Tobacco abuse   Malignant neoplasm of prostate (HCC)   Gout   Alcohol abuse   Essential hypertension  # Comminuted displaced left intertrochanteric hip fracture POD#2 from operative repair (intramedullary rod) by Dr. Cleotilde. Some serous/bloody drainage today.   - ortho to see - pt/ot advising snf, patient is reluctantly agreeable, TOC consulted - pain control, bowel regimen (stooled yesterday after procedure)  # Acute blood loss anemia 2/2 fracture and surger, hgb has trended to 8.9 today - trend  # Pubic ramus fracture Management is non-operative  # Asymptomatic v-tach On tele 6/22 - f/u TTE - maintain tele  # AKI Mild, likely from NPO status prior to surgery. Improving. Now taking PO - monitor off fluids  # Heavy drinking 2-4 drinks a day, denies history withdrawal - monitor  # Thrombocytopenia Mild, stable. Smear unremarkable - hiv, hcv   # Neuropathy - continue home pregabalin  # Hypertension BP wnl, doesn't appear currently taking meds at home - monitor  # History prostate cancer S/p surgery, radiation. PSA check here is low  # Tobacco abuse - patch   DVT prophylaxis:  lovenox Code Status: full Family Communication: son Glendia updated telephonically 6/23  Level of care: Med-Surg Status is: Inpatient Remains inpatient appropriate because: severity of illness    Consultants:  orthopedics  Procedures: orif  Antimicrobials:  Surgical ppx    Subjective: Reports mild hip pain  Objective: Vitals:   09/23/23 2354 09/24/23 0336 09/24/23 0500 09/24/23 0735  BP: (!) 116/54 (!) 113/52  110/60  Pulse: 81 79  68  Resp:  18  18  Temp: 97.8 F (36.6 C) 98 F (36.7 C)  97.8 F (36.6 C)  TempSrc:    Oral  SpO2: 95% 95%  94%  Weight:   84.2 kg   Height:   6' 2 (1.88 m)     Intake/Output Summary (Last 24 hours) at 09/24/2023 1255 Last data filed at 09/24/2023 1057 Gross per 24 hour  Intake 960 ml  Output 1675 ml  Net -715 ml   Filed Weights   09/24/23 0500  Weight: 84.2 kg    Examination:  General exam: Appears calm and comfortable  Respiratory system: Clear to auscultation. Respiratory effort normal. Cardiovascular system: S1 & S2 heard, RRR.   Gastrointestinal system: Abdomen is nondistended, soft and nontender. No organomegaly or masses felt. Normal bowel sounds heard. Central nervous system: Alert and oriented. No focal neurological deficits. Extremities: dressing left hip, distal sensation intact Skin: No rashes, lesions or ulcers Psychiatry: Judgement and insight appear normal. Mood & affect appropriate.  Data Reviewed: I have personally reviewed following labs and imaging studies  CBC: Recent Labs  Lab 09/22/23 0941 09/23/23 0118 09/24/23 0105  WBC 6.4 7.3  7.4 8.9  NEUTROABS 5.1 6.6  --   HGB 13.3 10.3*  10.4* 8.9*  HCT 39.4 30.9*  30.5* 26.3*  MCV 107.7* 108.8*  106.6* 108.2*  PLT 92* 102*  103* 100*   Basic Metabolic Panel: Recent Labs  Lab 09/22/23 0941 09/23/23 0118 09/24/23 0105  NA 136 135 135  K 3.8 4.0 4.6  CL 105 104 104  CO2 23 24 27   GLUCOSE 121* 161* 125*  BUN 12 14 17   CREATININE 0.95  1.33* 1.26*  CALCIUM  8.6* 8.1* 8.7*  MG 1.9  --   --    GFR: Estimated Creatinine Clearance: 54.4 mL/min (A) (by C-G formula based on SCr of 1.26 mg/dL (H)). Liver Function Tests: No results for input(s): AST, ALT, ALKPHOS, BILITOT, PROT, ALBUMIN in the last 168 hours. No results for input(s): LIPASE, AMYLASE in the last 168 hours. No results for input(s): AMMONIA in the last 168 hours. Coagulation Profile: Recent Labs  Lab 09/22/23 1042  INR 1.1   Cardiac Enzymes: No results for input(s): CKTOTAL, CKMB, CKMBINDEX, TROPONINI in the last 168 hours. BNP (last 3 results) No results for input(s): PROBNP in the last 8760 hours. HbA1C: No results for input(s): HGBA1C in the last 72 hours. CBG: Recent Labs  Lab 09/22/23 1331  GLUCAP 107*   Lipid Profile: No results for input(s): CHOL, HDL, LDLCALC, TRIG, CHOLHDL, LDLDIRECT in the last 72 hours. Thyroid  Function Tests: No results for input(s): TSH, T4TOTAL, FREET4, T3FREE, THYROIDAB in the last 72 hours. Anemia Panel: No results for input(s): VITAMINB12, FOLATE, FERRITIN, TIBC, IRON, RETICCTPCT in the last 72 hours. Urine analysis: No results found for: COLORURINE, APPEARANCEUR, LABSPEC, PHURINE, GLUCOSEU, HGBUR, BILIRUBINUR, KETONESUR, PROTEINUR, UROBILINOGEN, NITRITE, LEUKOCYTESUR Sepsis Labs: @LABRCNTIP (procalcitonin:4,lacticidven:4)  )No results found for this or any previous visit (from the past 240 hours).       Radiology Studies: DG HIP UNILAT WITH PELVIS 2-3 VIEWS LEFT Result Date: 09/22/2023 CLINICAL DATA:  Left hip fracture, intraoperative examination. EXAM: DG HIP (WITH OR WITHOUT PELVIS) 2-3V LEFT COMPARISON:  None Available. FINDINGS: Four fluoroscopic intraoperative radiographs demonstrate open reduction internal fixation of an intratrochanteric left hip fracture with fracture fragments in near anatomic alignment on final  images. No dislocation. Left hip joint space preserved on this limited examination. Fluoroscopic images: 4 Fluoroscopic dose: 17.47 mGy Fluoroscopic time: 74.3 seconds IMPRESSION: 1. Intraoperative examination during open reduction internal fixation of an intratrochanteric left hip fracture. Electronically Signed   By: Dorethia Molt M.D.   On: 09/22/2023 20:52   DG C-Arm 1-60 Min-No Report Result Date: 09/22/2023 Fluoroscopy was utilized by the requesting physician.  No radiographic interpretation.   DG C-Arm 1-60 Min-No Report Result Date: 09/22/2023 Fluoroscopy was utilized by the requesting physician.  No radiographic interpretation.        Scheduled Meds:  allopurinol   600 mg Oral Daily   enoxaparin (LOVENOX) injection  30 mg Subcutaneous Q24H   feeding supplement  237 mL Oral BID BM   ferrous sulfate  325 mg Oral Q breakfast   folic acid  1 mg Oral Daily   multivitamin with minerals  1 tablet Oral Daily   polyethylene glycol  17 g Oral Daily   rosuvastatin   10 mg Oral Daily   senna  1 tablet Oral BID   thiamine   100 mg Oral Daily  Or   thiamine   100 mg Intravenous Daily   traZODone   50 mg Oral QHS   Continuous Infusions:     LOS: 2 days     Devaughn KATHEE Ban, MD [facility]   If 7PM-7AM, please contact night-coverage [email or web address] Password [email or web address] 09/24/2023, 12:55 PM

## 2023-09-24 NOTE — Progress Notes (Addendum)
 Physical Therapy Treatment Patient Details Name: Ian Ross MRN: [id number] DOB: [birth year] Today's Date: 09/24/2023   History of Present Illness Pt is an 78 y/o male s/p ORIF L hip fx with trochanteric fixation nail secondary to sustaining a mechanical fall at work. PMH including but not limited to anxiety/depression, dementia, HTN, ETOH abuse, and prostate CA.    PT Comments  Pt seen for PT tx with encouragement re: mobility; pt with c/o increased L hip pain & nurse administered meds during session. Pt performed LLE bed level exercises with cuing re: technique, AAROM, cuing for breathing. Pt is able to come to sitting EOB but requires min<>mod assist 2/2 pain, min assist for sit>stand from elevated EOB. Pt tolerates standing ~5 minutes with RW, NWB LLE, min assist to allow nurse to change L hip dressing.  Pt progressed to ambulating around bed with RW but maintains NWB throughout majority of gait, decreased balance when hopping with RLE. Pt would benefit from ongoing PT services to progress mobility as able. Have updated d/c recs as this PT has concerns re: pt d/c home at this time; pt would be unable to get in/out of house as he has stair to negotiate, still cannot tolerate even PWB on LLE. Pt would benefit from STR upon d/c to maximize independence with functional mobility & reduce fall risk prior to return home.   If plan is discharge home, recommend the following: A lot of help with walking and/or transfers;A lot of help with bathing/dressing/bathroom;Assist for transportation;Assistance with cooking/housework;Help with stairs or ramp for entrance   Can travel by private vehicle     No  Equipment Recommendations  Rolling walker (2 wheels);BSC/3in1    Recommendations for Other Services Rehab consult     Precautions / Restrictions Precautions Precautions: Fall;Posterior Hip Restrictions Weight Bearing Restrictions Per Provider Order: Yes LLE Weight Bearing Per Provider Order:  Partial weight bearing LLE Partial Weight Bearing Percentage or Pounds: 50%     Mobility  Bed Mobility Overal bed mobility: Needs Assistance Bed Mobility: Supine to Sit     Supine to sit: Mod assist, HOB elevated, Used rails     General bed mobility comments: assistance to move LLE to EOB, assistance to upright trunk, use of HOB elevated & bed rails    Transfers Overall transfer level: Needs assistance Equipment used: Rolling walker (2 wheels) Transfers: Sit to/from Stand Sit to Stand: Min assist, From elevated surface           General transfer comment: sit>stand from EOB with cuing re: hand placement, elevated EOB    Ambulation/Gait Ambulation/Gait assistance: Min assist, Contact guard assist Gait Distance (Feet): 8 Feet Assistive device: Rolling walker (2 wheels) Gait Pattern/deviations: Step-to pattern, Decreased step length - right, Decreased stride length Gait velocity: decreased     General Gait Details: Pt elects to maintain NWB LLE throughout gait, at end of gait, does place L foot on floor & attempt to pivot to recliner.   Stairs             Wheelchair Mobility     Tilt Bed    Modified Rankin (Stroke Patients Only)       Balance Overall balance assessment: Needs assistance Sitting-balance support: Feet supported Sitting balance-Leahy Scale: Fair Sitting balance - Comments: R lateral lean to offload L hip Postural control: Right lateral lean Standing balance support: Bilateral upper extremity supported, During functional activity, Reliant on assistive device for balance Standing balance-Leahy Scale: Fair  Communication Communication Communication: No apparent difficulties  Cognition Arousal: Alert Behavior During Therapy: WFL for tasks assessed/performed   PT - Cognitive impairments: Memory, Safety/Judgement                       PT - Cognition Comments: Pt unable to recall 3/3  posterior hip precautions (also reports MD said he could move his leg however he wants), does recall PWB but decreased ability to implement this Following commands: Intact      Cueing Cueing Techniques: Verbal cues, Visual cues, Gestural cues  Exercises Total Joint Exercises Heel Slides: AAROM, Supine, Strengthening, Left, 10 reps Hip ABduction/ADduction: AAROM, Supine, Strengthening, 10 reps, Left LLE LAQ x 10 reps seated EOB, AROM    General Comments        Pertinent Vitals/Pain Pain Assessment Pain Assessment: Faces Faces Pain Scale: Hurts whole lot Pain Location: L hip at rest & with movement Pain Descriptors / Indicators: Discomfort, Grimacing, Guarding, Burning Pain Intervention(s): Monitored during session, Repositioned, Utilized relaxation techniques, RN gave pain meds during session    Home Living                          Prior Function            PT Goals (current goals can now be found in the care plan section) Acute Rehab PT Goals Patient Stated Goal: decrease pain and return home, return to PLOF PT Goal Formulation: With patient/family Time For Goal Achievement: 10/07/23 Potential to Achieve Goals: Good Progress towards PT goals: Progressing toward goals    Frequency    BID      PT Plan      Co-evaluation              AM-PAC PT 6 Clicks Mobility   Outcome Measure  Help needed turning from your back to your side while in a flat bed without using bedrails?: A Little Help needed moving from lying on your back to sitting on the side of a flat bed without using bedrails?: A Lot Help needed moving to and from a bed to a chair (including a wheelchair)?: A Little Help needed standing up from a chair using your arms (e.g., wheelchair or bedside chair)?: A Little Help needed to walk in hospital room?: A Lot Help needed climbing 3-5 steps with a railing? : Total 6 Click Score: 14    End of Session Equipment Utilized During Treatment:  Gait belt Activity Tolerance: Patient limited by pain Patient left: in chair;with chair alarm set;with call bell/phone within reach Nurse Communication: Mobility status;Weight bearing status PT Visit Diagnosis: Pain;Muscle weakness (generalized) (M62.81);Other abnormalities of gait and mobility (R26.89);Difficulty in walking, not elsewhere classified (R26.2);Unsteadiness on feet (R26.81) Pain - Right/Left: Left Pain - part of body: Hip;Leg     Time: 8898-8867 PT Time Calculation (min) (ACUTE ONLY): 31 min  Charges:    $Therapeutic Exercise: 8-22 mins $Therapeutic Activity: 8-22 mins PT General Charges $$ ACUTE PT VISIT: 1 Visit                     Richerd Pinal, PT, DPT 09/24/23, 3:09 PM    Richerd CHRISTELLA Pinal 09/24/2023, 11:42 AM

## 2023-09-24 NOTE — Progress Notes (Signed)
*  PRELIMINARY RESULTS* Echocardiogram 2D Echocardiogram has been performed.  Bari BROCKS Yuliya Nova 09/24/2023, 10:33 PM

## 2023-09-24 NOTE — Progress Notes (Signed)
 Subjective: 2 Days Post-Op Procedure(s) (LRB): FIXATION, FRACTURE, INTERTROCHANTERIC, WITH INTRAMEDULLARY ROD (Left) Patient is alert and oriented.  He has been out of bed to chair and is getting back into bed.  He is moving very slowly. Hemoglobin is 8.9.  His dressing has been changed due to some moderate drainage. Hip motion is satisfactory.  Patient reports pain as moderate.  Objective:   VITALS:   Vitals:   09/24/23 0336 09/24/23 0735  BP: (!) 113/52 110/60  Pulse: 79 68  Resp: 18 18  Temp: 98 F (36.7 C) 97.8 F (36.6 C)  SpO2: 95% 94%    Neurologically intact Sensation intact distally Incision: moderate drainage  LABS Recent Labs    09/22/23 0941 09/23/23 0118 09/24/23 0105  HGB 13.3 10.3*  10.4* 8.9*  HCT 39.4 30.9*  30.5* 26.3*  WBC 6.4 7.3  7.4 8.9  PLT 92* 102*  103* 100*    Recent Labs    09/22/23 0941 09/23/23 0118 09/24/23 0105  NA 136 135 135  K 3.8 4.0 4.6  BUN 12 14 17   CREATININE 0.95 1.33* 1.26*  GLUCOSE 121* 161* 125*    Recent Labs    09/22/23 1042  INR 1.1     Assessment/Plan: 2 Days Post-Op Procedure(s) (LRB): FIXATION, FRACTURE, INTERTROCHANTERIC, WITH INTRAMEDULLARY ROD (Left)   Advance diet Up with therapy Discharge to SNF most likely as he seems to be moving quite slowly. Discharge on ASA twice daily 81 mg for 6 weeks Return to clinic 2 weeks after discharge

## 2023-09-24 NOTE — Plan of Care (Signed)

## 2023-09-24 NOTE — NC FL2 (Addendum)
 [facility] [place]  MEDICAID [id number] LEVEL OF CARE FORM     IDENTIFICATION  Patient Name: Ian Ross Birthdate: May 16, 1945 Sex: male Admission Date (Current Location): 09/22/2023  [facility] and [place] Number:  Chiropodist and Address:  [facility], [address], [place], [place] [address]      Provider Number: [id number]  Attending Physician Name and Address:  Kandis Devaughn Sayres, MD  Relative Name and Phone Number:  Spouse: Eusevio Schriver, [phone]    Current Level of Care: SNF Recommended Level of Care: Skilled Nursing Facility Prior Approval Number:    Date Approved/Denied:   PASRR Number:  [id number]  Discharge Plan: SNF    Current Diagnoses: Patient Active Problem List   Diagnosis Date Noted   Acute postoperative anemia due to expected blood loss 09/24/2023   Essential hypertension 09/23/2023   Hip fracture (HCC) 09/22/2023   Gout 09/22/2023   Alcohol abuse 09/22/2023   Painful and cold lower extremity 04/15/2019   Atherosclerosis of native arteries of extremity with rest pain (HCC) 04/14/2019   Prominent popliteal pulse 04/14/2019   Solitary pulmonary nodule 09/08/2015   Atherosclerosis 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 09/08/2015   Personal history of tobacco use, presenting hazards to health 08/06/2015   Routine general medical examination at a health care facility 05/05/2015   Ganglion cyst 04/26/2015   Malignant neoplasm of prostate (HCC) 12/24/2014   Insomnia 12/24/2014   Tobacco abuse 05/20/2014   Medicare annual wellness visit, subsequent 04/18/2012   B12 deficiency 01/03/2012   Depression 12/06/2011   Dementia (HCC) 12/06/2011   Neuropathy (HCC) 12/06/2011    Orientation RESPIRATION BLADDER Height & Weight     Self, Time, Situation, Place  Normal Continent Weight: 84.2 kg Height:  6' 2 (188 cm)  BEHAVIORAL SYMPTOMS/MOOD NEUROLOGICAL BOWEL NUTRITION STATUS      Continent Diet  (Regular diet, thin liquids)  AMBULATORY STATUS COMMUNICATION OF NEEDS Skin   Limited Assist   Surgical wounds                       Personal Care Assistance Level of Assistance  Bathing, Feeding, Dressing Bathing Assistance: Limited assistance Feeding assistance: Independent Dressing Assistance: Limited assistance     Functional Limitations Info             SPECIAL CARE FACTORS FREQUENCY  PT (By licensed PT), OT (By licensed OT)     PT Frequency: 3 times per week OT Frequency: 3 times per week            Contractures Contractures Info: Not present    Additional Factors Info  Code Status, Allergies Code Status Info: Full code Allergies Info: Codeine           Current Medications (09/24/2023):  This is the current hospital active medication list Current Facility-Administered Medications  Medication Dose Route Frequency Provider Last Rate Last Admin   acetaminophen  (TYLENOL ) tablet 325-650 mg  325-650 mg Oral Q6H PRN Cleotilde Barrio, MD       allopurinol  (ZYLOPRIM ) tablet 600 mg  600 mg Oral Daily Cleotilde Barrio, MD   600 mg at 09/24/23 1028   alum & mag hydroxide-simeth (MAALOX/MYLANTA) 200-200-20 MG/5ML suspension 30 mL  30 mL Oral Q4H PRN Cleotilde Barrio, MD       bisacodyl (DULCOLAX) suppository 10 mg  10 mg Rectal Daily PRN Cleotilde Barrio, MD       enoxaparin (LOVENOX) injection 30 mg  30 mg  Subcutaneous Q24H Cleotilde Barrio, MD   30 mg at 09/24/23 0836   feeding supplement (ENSURE PLUS HIGH PROTEIN) liquid 237 mL  237 mL Oral BID BM Wouk, Devaughn Sayres, MD   237 mL at 09/24/23 1307   fentaNYL  (SUBLIMAZE ) injection 25-50 mcg  25-50 mcg Intravenous Q5 min PRN Cleotilde Barrio, MD   50 mcg at 09/22/23 1736   ferrous sulfate tablet 325 mg  325 mg Oral Q breakfast Cleotilde Barrio, MD   325 mg at 09/24/23 9160   folic acid (FOLVITE) tablet 1 mg  1 mg Oral Daily Cleotilde Barrio, MD   1 mg at 09/24/23 1030   hydrALAZINE (APRESOLINE) injection 5 mg  5 mg Intravenous Q4H PRN  Cleotilde Barrio, MD       HYDROcodone -acetaminophen  (NORCO/VICODIN) 5-325 MG per tablet 1-2 tablet  1-2 tablet Oral Q4H PRN Cleotilde Barrio, MD   2 tablet at 09/24/23 0837   magnesium hydroxide (MILK OF MAGNESIA) suspension 30 mL  30 mL Oral Daily PRN Cleotilde Barrio, MD   30 mL at 09/23/23 0854   menthol-cetylpyridinium (CEPACOL) lozenge 3 mg  1 lozenge Oral PRN Cleotilde Barrio, MD       Or   phenol (CHLORASEPTIC) mouth spray 1 spray  1 spray Mouth/Throat PRN Cleotilde Barrio, MD       metoCLOPramide (REGLAN) tablet 5-10 mg  5-10 mg Oral Q8H PRN Cleotilde Barrio, MD       Or   metoCLOPramide (REGLAN) injection 5-10 mg  5-10 mg Intravenous Q8H PRN Cleotilde Barrio, MD       multivitamin with minerals tablet 1 tablet  1 tablet Oral Daily Cleotilde Barrio, MD   1 tablet at 09/24/23 1030   nicotine (NICODERM CQ - dosed in mg/24 hours) patch 14 mg  14 mg Transdermal Daily PRN Cleotilde Barrio, MD       ondansetron  (ZOFRAN ) tablet 4 mg  4 mg Oral Q6H PRN Cleotilde Barrio, MD       Or   ondansetron  (ZOFRAN ) injection 4 mg  4 mg Intravenous Q6H PRN Cleotilde Barrio, MD       polyethylene glycol (MIRALAX / GLYCOLAX) packet 17 g  17 g Oral Daily Kandis Devaughn Sayres, MD   17 g at 09/24/23 1032   rosuvastatin  (CRESTOR ) tablet 10 mg  10 mg Oral Daily Cleotilde Barrio, MD   10 mg at 09/23/23 2112   senna (SENOKOT) tablet 8.6 mg  1 tablet Oral BID Cleotilde Barrio, MD   8.6 mg at 09/24/23 1029   sodium phosphate  (FLEET) enema 1 enema  1 enema Rectal Once PRN Cleotilde Barrio, MD       thiamine  (VITAMIN B1) tablet 100 mg  100 mg Oral Daily Cleotilde Barrio, MD   100 mg at 09/24/23 1029   Or   thiamine  (VITAMIN B1) injection 100 mg  100 mg Intravenous Daily Cleotilde Barrio, MD   100 mg at 09/22/23 1249   traZODone  (DESYREL ) tablet 50 mg  50 mg Oral QHS Cleotilde Barrio, MD   50 mg at 09/22/23 2125     Discharge Medications: Please see discharge summary for a list of discharge medications.  Relevant Imaging Results:  Relevant Lab  Results:   Additional Information SSN: [id number]  Quintella Suzen Jansky, RN

## 2023-09-24 NOTE — Progress Notes (Signed)
 Physical Therapy Treatment Patient Details Name: Ian Ross MRN: [id number] DOB: [birth year] Today's Date: 09/24/2023   History of Present Illness Pt is an 78 y/o male s/p ORIF L hip fx with trochanteric fixation nail secondary to sustaining a mechanical fall at work. PMH including but not limited to anxiety/depression, dementia, HTN, ETOH abuse, and prostate CA.    PT Comments  Pt seen for PT tx with pt agreeable with encouragement reporting need to use restroom. Pt with c/o ongoing pain in L hip. Pt requires min<>mod assist for bed mobility, min assist for sit<>stand from elevated surface with cuing re: hand placement, min assist to ambulate to bathroom & back with RW. Pt maintains NWB LLE throughout half of gait, cuing to weight bear through LLE with pt briefly doing so through L forefoot; pt limiting weight bearing 2/2 pain. Pt with continent void & BM requiring total assist for peri hygiene. Continue to recommend post acute rehab <3 hours therapy/day upon d/c.    If plan is discharge home, recommend the following: A lot of help with walking and/or transfers;A lot of help with bathing/dressing/bathroom;Assist for transportation;Assistance with cooking/housework;Help with stairs or ramp for entrance   Can travel by private vehicle     No  Equipment Recommendations  Rolling walker (2 wheels);BSC/3in1    Recommendations for Other Services Rehab consult     Precautions / Restrictions Precautions Precautions: Fall;Posterior Hip Precaution/Restrictions Comments: PT reviewed PWB 50% and POST HIP PRECS with pt and family members throughout session, with frequent reminders, cueing and demos Restrictions Weight Bearing Restrictions Per Provider Order: Yes LLE Weight Bearing Per Provider Order: Partial weight bearing LLE Partial Weight Bearing Percentage or Pounds: 50%     Mobility  Bed Mobility Overal bed mobility: Needs Assistance Bed Mobility: Supine to Sit, Sit to Supine      Supine to sit: Min assist, HOB elevated, Used rails (assistance to move LLE to EOB, uses bed rails & HOB to upright trunk with less assist than AM session) Sit to supine: Mod assist, HOB elevated, Used rails (assistance to elevate BLE onto bed, pt with decreased ability to pivot trunk to pillow & instead lays back on bed in diagonal)   General bed mobility comments: assistance to move LLE to EOB, assistance to upright trunk, use of HOB elevated & bed rails    Transfers Overall transfer level: Needs assistance Equipment used: Rolling walker (2 wheels) Transfers: Sit to/from Stand Sit to Stand: Min assist           General transfer comment: sit>stand from elevated surface, cuing re: hand placement, slowly improving eccentric lowering during stand>sit, cuing to scoot L foot out in front but poor ability to do so    Ambulation/Gait Ambulation/Gait assistance: Min assist Gait Distance (Feet): 8 Feet (+ 8 ft) Assistive device: Rolling walker (2 wheels) Gait Pattern/deviations: Decreased step length - right, Decreased step length - left, Decreased stride length, Decreased dorsiflexion - left Gait velocity: decreased     General Gait Details: Pt maintains NWB LLE ~50% of the time, PT provides encouragement to weight bear some through LLE with pt placing L forefoot on floor, decreased weight bearing, cuing re: safety. Decreased R foot clearance when turning to sit on EOB.   Stairs             Wheelchair Mobility     Tilt Bed    Modified Rankin (Stroke Patients Only)       Balance Overall balance assessment: Needs assistance Sitting-balance  support: Feet supported, Bilateral upper extremity supported Sitting balance-Leahy Scale: Fair Sitting balance - Comments: supervision static sitting, still mild<>moderate R lateral lean to offload L hip in sitting Postural control: Right lateral lean Standing balance support: Bilateral upper extremity supported, During functional  activity, Reliant on assistive device for balance Standing balance-Leahy Scale: Poor                              Communication Communication Communication: No apparent difficulties  Cognition Arousal: Alert Behavior During Therapy: WFL for tasks assessed/performed   PT - Cognitive impairments: Memory, Safety/Judgement                       PT - Cognition Comments: Pt unable to recall 3/3 posterior hip precautions (also reports MD said he could move his leg however he wants), does recall PWB but decreased ability to implement this Following commands: Intact      Cueing Cueing Techniques: Verbal cues, Gestural cues, Tactile cues  Exercises Total Joint Exercises Heel Slides: AAROM, Supine, Strengthening, Left, 10 reps Hip ABduction/ADduction: AAROM, Supine, Strengthening, 10 reps, Left    General Comments General comments (skin integrity, edema, etc.): Pt with continent void & BM on toilet, requires total assist for peri hygiene.      Pertinent Vitals/Pain Pain Assessment Pain Assessment: Faces Faces Pain Scale: Hurts whole lot Pain Location: L hip Pain Descriptors / Indicators: Discomfort, Grimacing, Guarding, Burning Pain Intervention(s): Monitored during session, Limited activity within patient's tolerance, Repositioned, Utilized relaxation techniques    Home Living                          Prior Function            PT Goals (current goals can now be found in the care plan section) Acute Rehab PT Goals Patient Stated Goal: decrease pain and return home, return to PLOF PT Goal Formulation: With patient/family Time For Goal Achievement: 10/07/23 Potential to Achieve Goals: Good Progress towards PT goals: Progressing toward goals    Frequency    BID      PT Plan      Co-evaluation              AM-PAC PT 6 Clicks Mobility   Outcome Measure  Help needed turning from your back to your side while in a flat bed without  using bedrails?: A Little Help needed moving from lying on your back to sitting on the side of a flat bed without using bedrails?: A Lot Help needed moving to and from a bed to a chair (including a wheelchair)?: A Little Help needed standing up from a chair using your arms (e.g., wheelchair or bedside chair)?: A Little Help needed to walk in hospital room?: A Lot Help needed climbing 3-5 steps with a railing? : Total 6 Click Score: 14    End of Session Equipment Utilized During Treatment: Gait belt Activity Tolerance: Patient limited by pain Patient left: in bed;with call bell/phone within reach;with bed alarm set;with SCD's reapplied;with family/visitor present Nurse Communication:  (pt had BM during session) PT Visit Diagnosis: Pain;Muscle weakness (generalized) (M62.81);Other abnormalities of gait and mobility (R26.89);Difficulty in walking, not elsewhere classified (R26.2);Unsteadiness on feet (R26.81) Pain - Right/Left: Left Pain - part of body: Hip;Leg     Time: 1458-1520 PT Time Calculation (min) (ACUTE ONLY): 22 min  Charges:     $Therapeutic  Activity: 8-22 mins PT General Charges $$ ACUTE PT VISIT: 1 Visit                     Richerd Pinal, PT, DPT 09/24/23, 3:29 PM    Richerd CHRISTELLA Pinal 09/24/2023, 3:27 PM

## 2023-09-25 DIAGNOSIS — F1027 Alcohol dependence with alcohol-induced persisting dementia: Secondary | ICD-10-CM | POA: Diagnosis not present

## 2023-09-25 DIAGNOSIS — E538 Deficiency of other specified B group vitamins: Secondary | ICD-10-CM | POA: Diagnosis not present

## 2023-09-25 DIAGNOSIS — Z8546 Personal history of malignant neoplasm of prostate: Secondary | ICD-10-CM | POA: Diagnosis not present

## 2023-09-25 DIAGNOSIS — I10 Essential (primary) hypertension: Secondary | ICD-10-CM | POA: Diagnosis not present

## 2023-09-25 DIAGNOSIS — Z7401 Bed confinement status: Secondary | ICD-10-CM | POA: Diagnosis not present

## 2023-09-25 DIAGNOSIS — Z720 Tobacco use: Secondary | ICD-10-CM | POA: Diagnosis not present

## 2023-09-25 DIAGNOSIS — S72142D Displaced intertrochanteric fracture of left femur, subsequent encounter for closed fracture with routine healing: Secondary | ICD-10-CM | POA: Diagnosis not present

## 2023-09-25 DIAGNOSIS — D62 Acute posthemorrhagic anemia: Secondary | ICD-10-CM | POA: Diagnosis not present

## 2023-09-25 DIAGNOSIS — F32A Depression, unspecified: Secondary | ICD-10-CM | POA: Diagnosis not present

## 2023-09-25 DIAGNOSIS — S72002D Fracture of unspecified part of neck of left femur, subsequent encounter for closed fracture with routine healing: Secondary | ICD-10-CM | POA: Diagnosis not present

## 2023-09-25 DIAGNOSIS — G629 Polyneuropathy, unspecified: Secondary | ICD-10-CM | POA: Diagnosis not present

## 2023-09-25 DIAGNOSIS — S72009A Fracture of unspecified part of neck of unspecified femur, initial encounter for closed fracture: Secondary | ICD-10-CM | POA: Diagnosis not present

## 2023-09-25 DIAGNOSIS — S72352A Displaced comminuted fracture of shaft of left femur, initial encounter for closed fracture: Secondary | ICD-10-CM | POA: Diagnosis not present

## 2023-09-25 DIAGNOSIS — E785 Hyperlipidemia, unspecified: Secondary | ICD-10-CM | POA: Diagnosis not present

## 2023-09-25 DIAGNOSIS — Z9181 History of falling: Secondary | ICD-10-CM | POA: Diagnosis not present

## 2023-09-25 DIAGNOSIS — M6281 Muscle weakness (generalized): Secondary | ICD-10-CM | POA: Diagnosis not present

## 2023-09-25 DIAGNOSIS — M109 Gout, unspecified: Secondary | ICD-10-CM | POA: Diagnosis not present

## 2023-09-25 DIAGNOSIS — R2689 Other abnormalities of gait and mobility: Secondary | ICD-10-CM | POA: Diagnosis not present

## 2023-09-25 DIAGNOSIS — I70223 Atherosclerosis of native arteries of extremities with rest pain, bilateral legs: Secondary | ICD-10-CM | POA: Diagnosis not present

## 2023-09-25 DIAGNOSIS — F0393 Unspecified dementia, unspecified severity, with mood disturbance: Secondary | ICD-10-CM | POA: Diagnosis not present

## 2023-09-25 DIAGNOSIS — S72002A Fracture of unspecified part of neck of left femur, initial encounter for closed fracture: Secondary | ICD-10-CM | POA: Diagnosis not present

## 2023-09-25 DIAGNOSIS — Z471 Aftercare following joint replacement surgery: Secondary | ICD-10-CM | POA: Diagnosis not present

## 2023-09-25 LAB — ECHOCARDIOGRAM COMPLETE
AR max vel: 2.26 cm2
AV Area VTI: 2.34 cm2
AV Area mean vel: 2.36 cm2
AV Mean grad: 4 mmHg
AV Peak grad: 8.1 mmHg
Ao pk vel: 1.42 m/s
Area-P 1/2: 6.96 cm2
Height: 74 in
MV VTI: 2.92 cm2
S' Lateral: 3.1 cm
Weight: 2970.04 [oz_av]

## 2023-09-25 LAB — CBC
HCT: 24.8 % — ABNORMAL LOW (ref 39.0–52.0)
Hemoglobin: 8.4 g/dL — ABNORMAL LOW (ref 13.0–17.0)
MCH: 37.2 pg — ABNORMAL HIGH (ref 26.0–34.0)
MCHC: 33.9 g/dL (ref 30.0–36.0)
MCV: 109.7 fL — ABNORMAL HIGH (ref 80.0–100.0)
Platelets: 99 10*3/uL — ABNORMAL LOW (ref 150–400)
RBC: 2.26 MIL/uL — ABNORMAL LOW (ref 4.22–5.81)
RDW: 13.8 % (ref 11.5–15.5)
WBC: 7.4 10*3/uL (ref 4.0–10.5)
nRBC: 0 % (ref 0.0–0.2)

## 2023-09-25 LAB — BASIC METABOLIC PANEL WITH GFR
Anion gap: 5 (ref 5–15)
BUN: 15 mg/dL (ref 8–23)
CO2: 28 mmol/L (ref 22–32)
Calcium: 8.8 mg/dL — ABNORMAL LOW (ref 8.9–10.3)
Chloride: 105 mmol/L (ref 98–111)
Creatinine, Ser: 1.03 mg/dL (ref 0.61–1.24)
GFR, Estimated: >60 mL/min (ref >60)
Glucose, Bld: 107 mg/dL — ABNORMAL HIGH (ref 70–99)
Potassium: 4.3 mmol/L (ref 3.5–5.1)
Sodium: 138 mmol/L (ref 135–145)

## 2023-09-25 LAB — HCV INTERPRETATION

## 2023-09-25 LAB — HCV AB W REFLEX TO QUANT PCR: HCV Ab: NONREACTIVE

## 2023-09-25 MED ORDER — FERROUS SULFATE 325 (65 FE) MG PO TABS: 325.0000 mg | ORAL_TABLET | ORAL | Status: AC

## 2023-09-25 MED ORDER — POLYETHYLENE GLYCOL 3350 17 G PO PACK: 17.0000 g | PACK | Freq: Every day | ORAL | Status: AC

## 2023-09-25 MED ORDER — NICOTINE 14 MG/24HR TD PT24: 14.0000 mg | MEDICATED_PATCH | Freq: Every day | TRANSDERMAL | Status: AC | PRN

## 2023-09-25 MED ORDER — OXYCODONE HCL 5 MG PO TABS: 5.0000 mg | ORAL_TABLET | Freq: Three times a day (TID) | ORAL | 0 refills | Status: DC | PRN

## 2023-09-25 MED ORDER — ASPIRIN 81 MG PO TBEC: 81.0000 mg | DELAYED_RELEASE_TABLET | Freq: Two times a day (BID) | ORAL | Status: AC

## 2023-09-25 NOTE — Discharge Summary (Signed)
 Ian Ross FMW:[id number] DOB: 08-14-1945 DOA: 09/22/2023  PCP: Marikay Eva POUR, PA  Admit date: 09/22/2023 Discharge date: 09/25/2023  Time spent: 35 minutes  Recommendations for Outpatient Follow-up:  Pcp f/u Orthopedics (Dr. Cleotilde) f/u 2 weeks  F/u results of TTE (completed but results pending at time of discharge)    Discharge Diagnoses:  Principal Problem:   Hip fracture (HCC) Active Problems:   Depression   Dementia (HCC)   Tobacco abuse   Malignant neoplasm of prostate (HCC)   Gout   Alcohol abuse   Essential hypertension   Acute postoperative anemia due to expected blood loss   Discharge Condition: stable  Diet recommendation: heart healthy  Filed Weights   09/24/23 0500 09/25/23 0454  Weight: 84.2 kg 84.8 kg    History of present illness:  From admission h and p Ian Ross is a 78 y.o. male with medical history significant of anxiety/depression, dementia, HTN, and prostate CA who presented on 6/21 with a fall.  He reports that he was in his usual state of health and was fixing a road grater when the bolt slipped and he lost his footing.  He fell backwards, landing on his left hip.  Pain is suboptimally controlled at this time.  No other injuries other than mild L elbow abrasion.  He drinks daily 2-6 beers.  He also smokes.   Hospital Course:   # Comminuted displaced left intertrochanteric hip fracture POD#3 from operative repair (intramedullary rod) by Dr. Cleotilde. Some serous/bloody drainage but this appears controlled - pt/ot advising snf, patient is reluctantly agreeable,  - pain control, bowel regimen (stooled yesterday) - asa 81 bid for 6 wks at d/c for dvt ppx, per dr. Cleotilde - f/u ortho 2 weeks   # Acute blood loss anemia 2/2 fracture and surger, hgb has seems to have stabilized in the 8s - oral iron started  # Pubic ramus fracture Management is non-operative   # Asymptomatic v-tach - f/u TTE results   # AKI Mild, likely from  NPO status prior to surgery. Resolved   # Heavy drinking 2-4 drinks a day, denies history withdrawal, no s/s withdrawal here   # Thrombocytopenia Mild, stable. Smear unremarkable. Hiv/hcv neg.   # Neuropathy - continue home pregabalin   # Hypertension BP wnl, doesn't appear currently taking meds at home   # History prostate cancer S/p surgery, radiation. PSA check here is low   # Tobacco abuse - patch    Procedures: ORIF hip fracture   Consultations: orthopedics  Discharge Exam: Vitals:   09/25/23 0711 09/25/23 0721  BP: (!) 118/56 (!) 119/50  Pulse: 80   Resp: 16   Temp:  98.4 F (36.9 C)  SpO2: 94%     General exam: Appears calm and comfortable  Respiratory system: Clear to auscultation. Respiratory effort normal. Cardiovascular system: S1 & S2 heard, RRR.   Gastrointestinal system: Abdomen is nondistended, soft and nontender. No organomegaly or masses felt. Normal bowel sounds heard. Central nervous system: Alert and oriented. No focal neurological deficits. Extremities: dressing left hip, distal sensation intact Skin: No rashes, lesions or ulcers Psychiatry: Judgement and insight appear normal. Mood & affect appropriate.   Discharge Instructions   Discharge Instructions     Diet - low sodium heart healthy   Complete by: As directed    Increase activity slowly   Complete by: As directed    Leave dressing on - Keep it clean, dry, and intact until clinic visit  Complete by: As directed       Allergies as of 09/25/2023       Reactions   Codeine Nausea Only, Anxiety, Other (See Comments)        Medication List     PAUSE taking these medications    amLODipine  5 MG tablet Wait to take this until your doctor or other care provider tells you to start again. Commonly known as: NORVASC  Take 1 tablet (5 mg total) by mouth daily.   aspirin 81 MG chewable tablet Wait to take this until your doctor or other care provider tells you to start  again. Chew 81 mg by mouth daily. You also have another medication with the same name that you may need to continue taking.       TAKE these medications    allopurinol  300 MG tablet Commonly known as: ZYLOPRIM  Take 600 mg by mouth daily.   aspirin EC 81 MG tablet Take 1 tablet (81 mg total) by mouth 2 (two) times daily. What changed: Another medication with the same name was paused. Ask your nurse or doctor if you should take this medication.   cyanocobalamin  500 MCG tablet Commonly known as: VITAMIN B12 Take 500 mcg by mouth daily.   ferrous sulfate 325 (65 FE) MG tablet Take 1 tablet (325 mg total) by mouth every other day.   nicotine 14 mg/24hr patch Commonly known as: NICODERM CQ - dosed in mg/24 hours Place 1 patch (14 mg total) onto the skin daily as needed (nicotine dependence).   oxyCODONE  5 MG immediate release tablet Commonly known as: Roxicodone  Take 1 tablet (5 mg total) by mouth every 8 (eight) hours as needed.   polyethylene glycol 17 g packet Commonly known as: MIRALAX / GLYCOLAX Take 17 g by mouth daily. Start taking on: September 26, 2023   pregabalin 100 MG capsule Commonly known as: LYRICA Take 100 mg by mouth 2 (two) times daily.   rosuvastatin  10 MG tablet Commonly known as: CRESTOR  Take 10 mg by mouth daily.   traZODone  50 MG tablet Commonly known as: DESYREL  Take 50 mg by mouth at bedtime.               Discharge Care Instructions  (From admission, onward)           Start     Ordered   09/25/23 0000  Leave dressing on - Keep it clean, dry, and intact until clinic visit        09/25/23 1223           Allergies  Allergen Reactions   Codeine Nausea Only, Anxiety and Other (See Comments)    Contact information for follow-up providers     Cleotilde Barrio, MD Follow up in 2 week(s).   Specialty: Orthopedic Surgery Contact information: [address] [place] [address] [phone]         Marikay Eva POUR, [place] Follow up.   Specialty: Physician Assistant Contact information: [phone] [facility] [address] [facility] [place] [address] [phone]              Contact information for after-discharge care     Destination     [facility] .   Service: Skilled Nursing Contact information: [address] [place]  [address] [phone]                      The results of significant diagnostics from this hospitalization (including imaging, microbiology, ancillary  and laboratory) are listed below for reference.    Significant Diagnostic Studies: DG HIP UNILAT WITH PELVIS 2-3 VIEWS LEFT Result Date: 09/22/2023 CLINICAL DATA:  Left hip fracture, intraoperative examination. EXAM: DG HIP (WITH OR WITHOUT PELVIS) 2-3V LEFT COMPARISON:  None Available. FINDINGS: Four fluoroscopic intraoperative radiographs demonstrate open reduction internal fixation of an intratrochanteric left hip fracture with fracture fragments in near anatomic alignment on final images. No dislocation. Left hip joint space preserved on this limited examination. Fluoroscopic images: 4 Fluoroscopic dose: 17.47 mGy Fluoroscopic time: 74.3 seconds IMPRESSION: 1. Intraoperative examination during open reduction internal fixation of an intratrochanteric left hip fracture. Electronically Signed   By: Dorethia Molt M.D.   On: 09/22/2023 20:52   DG C-Arm 1-60 Min-No Report Result Date: 09/22/2023 Fluoroscopy was utilized by the requesting physician.  No radiographic interpretation.   DG C-Arm 1-60 Min-No Report Result Date: 09/22/2023 Fluoroscopy was utilized by the requesting physician.  No radiographic interpretation.   DG Chest 1 View Result Date: 09/22/2023 CLINICAL DATA:  Status post fall with left femoral fracture EXAM: CHEST  1 VIEW COMPARISON:  CT chest dated 07/23/2023 FINDINGS: Normal lung volumes. No focal consolidations. No pleural effusion or pneumothorax. The heart size and  mediastinal contours are within normal limits. No acute osseous abnormality. IMPRESSION: No acute disease. Electronically Signed   By: Limin  Xu M.D.   On: 09/22/2023 09:36   DG Hip Unilat W or Wo Pelvis 2-3 Views Left Result Date: 09/22/2023 CLINICAL DATA:  Status post fall with left leg deformity EXAM: DG HIP (WITH OR WITHOUT PELVIS) 3V LEFT COMPARISON:  None Available. FINDINGS: Comminuted left intertrochanteric fracture with varus angulation. Anatomic alignment of the left femoroacetabular joint. Possible minimally displaced fracture of the inferior left pubic ramus. Radiopaque prostate fiducials project over the midline inferior pelvis. IMPRESSION: 1. Comminuted left intertrochanteric fracture with varus angulation. 2. Possible minimally displaced fracture of the inferior left pubic ramus. Electronically Signed   By: Limin  Xu M.D.   On: 09/22/2023 09:35    Microbiology: No results found for this or any previous visit (from the past 240 hours).   Labs: Basic Metabolic Panel: Recent Labs  Lab 09/22/23 0941 09/23/23 0118 09/24/23 0105 09/25/23 0405  NA 136 135 135 138  K 3.8 4.0 4.6 4.3  CL 105 104 104 105  CO2 23 24 27 28   GLUCOSE 121* 161* 125* 107*  BUN 12 14 17 15   CREATININE 0.95 1.33* 1.26* 1.03  CALCIUM  8.6* 8.1* 8.7* 8.8*  MG 1.9  --   --   --    Liver Function Tests: No results for input(s): AST, ALT, ALKPHOS, BILITOT, PROT, ALBUMIN in the last 168 hours. No results for input(s): LIPASE, AMYLASE in the last 168 hours. No results for input(s): AMMONIA in the last 168 hours. CBC: Recent Labs  Lab 09/22/23 0941 09/23/23 0118 09/24/23 0105 09/25/23 0405  WBC 6.4 7.3  7.4 8.9 7.4  NEUTROABS 5.1 6.6  --   --   HGB 13.3 10.3*  10.4* 8.9* 8.4*  HCT 39.4 30.9*  30.5* 26.3* 24.8*  MCV 107.7* 108.8*  106.6* 108.2* 109.7*  PLT 92* 102*  103* 100* 99*   Cardiac Enzymes: No results for input(s): CKTOTAL, CKMB, CKMBINDEX, TROPONINI in the last  168 hours. BNP: BNP (last 3 results) No results for input(s): BNP in the last 8760 hours.  ProBNP (last 3 results) No results for input(s): PROBNP in the last 8760 hours.  CBG: Recent Labs  Lab 09/22/23  1331  GLUCAP 107*       Signed:  Devaughn KATHEE Ban MD.  [facility] 09/25/2023, 12:26 PM

## 2023-09-25 NOTE — TOC Progression Note (Signed)
 Transition of Care [facility]) - Progression Note    Patient Details  Name: QUINTUS PREMO MRN: [id number] Date of Birth: 1945/05/14  [facility] CM/SW Contact  Quintella Suzen Jansky, RN Phone Number: 09/25/2023, 7:44 AM  Clinical Narrative:     Contacted [facility], attempting to initiate auth for [facility]. No answer, left message requesting call back. Provided contact number.  Expected Discharge Plan: Skilled Nursing Facility Barriers to Discharge: Continued Medical Work up  Expected Discharge Plan and Services     Post Acute Care Choice: Skilled Nursing Facility Living arrangements for the past 2 months: Single Family Home                                       Social Determinants of Health (SDOH) Interventions SDOH Screenings   Food Insecurity: No Food Insecurity (09/22/2023)  Housing: Unknown (09/22/2023)  Transportation Needs: No Transportation Needs (09/22/2023)  Utilities: Not At Risk (09/22/2023)  Financial Resource Strain: Low Risk  (05/28/2023)   Received from [facility]  Social Connections: Unknown (09/22/2023)  Tobacco Use: High Risk (09/22/2023)    Readmission Risk Interventions     No data to display

## 2023-09-25 NOTE — TOC Progression Note (Signed)
 Transition of Care [facility]) - Progression Note    Patient Details  Name: [name] MRN: [id number] Date of Birth: 03-31-1946  [facility] CM/SW Contact  Quintella Suzen Jansky, RN Phone Number: 09/25/2023, 9:33 AM  Clinical Narrative:     Received call back from [place] at [employer]. Initiated auth for [facility] and also for transport with [facility]. Notified her patient is medically ready for discharge.   Expected Discharge Plan: Skilled Nursing Facility Barriers to Discharge: Continued Medical Work up  Expected Discharge Plan and Services     Post Acute Care Choice: Skilled Nursing Facility Living arrangements for the past 2 months: Single Family Home                                       Social Determinants of Health (SDOH) Interventions SDOH Screenings   Food Insecurity: No Food Insecurity (09/22/2023)  Housing: Unknown (09/22/2023)  Transportation Needs: No Transportation Needs (09/22/2023)  Utilities: Not At Risk (09/22/2023)  Financial Resource Strain: Low Risk  (05/28/2023)   [facility]  Social Connections: Unknown (09/22/2023)  Tobacco Use: High Risk (09/22/2023)    Readmission Risk Interventions     No data to display

## 2023-09-25 NOTE — Progress Notes (Signed)
 Physical Therapy Treatment Patient Details Name: Ian Ross MRN: [id number] DOB: [birth year] Today's Date: 09/25/2023   History of Present Illness Pt is an 78 y/o male s/p ORIF L hip fx with trochanteric fixation nail secondary to sustaining a mechanical fall at work. PMH including but not limited to anxiety/depression, dementia, HTN, ETOH abuse, and prostate CA.    PT Comments  Today's tx involved a continuation of supine therex and ambulation, with education of precautions. Pt continues to be severely limited by pain this date. PT educated on medicating before therapy session to be able to increase participation. Pt continues to require multiple verbal and tactile cues to initiate and complete mobility, as pt heavily guards. STS transfers with mod a this date with raised bed height and use of RW, increased time to complete transition. Ambulation distance continues to be limited by pain, pt using a hopping step to pattern despite education on PWB precautions. Pt will continue to benefit from skilled PT interventions to address deficits and meet therapy goals. PT to follow acutely.     If plan is discharge home, recommend the following: A lot of help with walking and/or transfers;A lot of help with bathing/dressing/bathroom;Assist for transportation;Assistance with cooking/housework;Help with stairs or ramp for entrance   Can travel by private vehicle     No  Equipment Recommendations  BSC/3in1    Recommendations for Other Services       Precautions / Restrictions Precautions Precautions: Fall Recall of Precautions/Restrictions: Intact Precaution/Restrictions Comments: Reviewed PWB 50%  with pt throughout session, with frequent reminders, cueing and demos Restrictions Weight Bearing Restrictions Per Provider Order: Yes LLE Weight Bearing Per Provider Order: Partial weight bearing LLE Partial Weight Bearing Percentage or Pounds: 50%     Mobility  Bed Mobility Overal bed  mobility: Needs Assistance Bed Mobility: Supine to Sit     Supine to sit: Min assist, HOB elevated, Used rails     General bed mobility comments: multiple verbal and tactiles cues needed to start movement, encouragement needed to continue movement d/t pain    Transfers Overall transfer level: Needs assistance Equipment used: Rolling walker (2 wheels) Transfers: Sit to/from Stand, Bed to chair/wheelchair/BSC Sit to Stand: Mod assist   Step pivot transfers: Mod assist       General transfer comment: raised bed height, multiple cues for hand and feet placement, increased time to complete transfer, pt educated on asking for pain medication prior for therapy as pt involvement continues to be limited by pain    Ambulation/Gait Ambulation/Gait assistance: Min assist Gait Distance (Feet): 10 Feet Assistive device: Rolling walker (2 wheels) Gait Pattern/deviations: Decreased step length - right, Decreased step length - left, Decreased stride length, Decreased dorsiflexion - left       General Gait Details: Pt continues to exhibit hopping step to pattern despite education on PWB precaution education, pt severely guarded throughout ambulation, 9/10 pain   Stairs             Wheelchair Mobility     Tilt Bed    Modified Rankin (Stroke Patients Only)       Balance Overall balance assessment: Needs assistance Sitting-balance support: Feet supported, Bilateral upper extremity supported Sitting balance-Leahy Scale: Fair Sitting balance - Comments: CGA sitting EOB d/t pt right lateral lean, vebal and tactile cues given Postural control: Right lateral lean Standing balance support: Bilateral upper extremity supported, During functional activity, Reliant on assistive device for balance Standing balance-Leahy Scale: Poor Standing balance comment: using RW, right  lateral lean while standing, pt involvement limited by pain                            Communication  Communication Communication: No apparent difficulties  Cognition Arousal: Alert Behavior During Therapy: WFL for tasks assessed/performed   PT - Cognitive impairments: Safety/Judgement                       PT - Cognition Comments: able to recall PWB precautions Following commands: Intact      Cueing Cueing Techniques: Verbal cues, Gestural cues, Tactile cues  Exercises General Exercises - Lower Extremity Ankle Circles/Pumps: AROM, Both, 10 reps Straight Leg Raises: AROM, Both, 10 reps, Other (comment) (min a for raising L LE)    General Comments        Pertinent Vitals/Pain Pain Assessment Pain Assessment: 0-10 Pain Score: 9  Pain Location: L hip Pain Descriptors / Indicators: Discomfort, Grimacing, Guarding, Burning Pain Intervention(s): Monitored during session, RN gave pain meds during session    Home Living                          Prior Function            PT Goals (current goals can now be found in the care plan section) Acute Rehab PT Goals Patient Stated Goal: decrease pain and return home, return to PLOF PT Goal Formulation: With patient/family Time For Goal Achievement: 10/07/23 Potential to Achieve Goals: Good Progress towards PT goals: Progressing toward goals    Frequency    BID      PT Plan      Co-evaluation              AM-PAC PT 6 Clicks Mobility   Outcome Measure  Help needed turning from your back to your side while in a flat bed without using bedrails?: A Little Help needed moving from lying on your back to sitting on the side of a flat bed without using bedrails?: A Lot Help needed moving to and from a bed to a chair (including a wheelchair)?: A Lot Help needed standing up from a chair using your arms (e.g., wheelchair or bedside chair)?: A Little Help needed to walk in hospital room?: A Lot Help needed climbing 3-5 steps with a railing? : Total 6 Click Score: 13    End of Session Equipment Utilized  During Treatment: Gait belt Activity Tolerance: Patient limited by pain Patient left: in chair;with call bell/phone within reach;with chair alarm set;with nursing/sitter in room Nurse Communication: Mobility status;Patient requests pain meds PT Visit Diagnosis: Pain;Muscle weakness (generalized) (M62.81);Other abnormalities of gait and mobility (R26.89);Difficulty in walking, not elsewhere classified (R26.2);Unsteadiness on feet (R26.81) Pain - Right/Left: Left Pain - part of body: Hip;Leg     Time: 8895-8866 PT Time Calculation (min) (ACUTE ONLY): 29 min  Charges:                               Meiling Hendriks Romero-Perozo, SPT  09/25/2023, 12:50 PM

## 2023-09-25 NOTE — Progress Notes (Signed)
 Report given to [employer], [place] (LPN). Discharge paper and belongings  handed to patient. Awaiting EMS.

## 2023-09-25 NOTE — Clinical Note (Incomplete)
 Report given to [employer] to

## 2023-09-25 NOTE — TOC Progression Note (Signed)
 Transition of Care [facility]) - Progression Note    Patient Details  Name: Ian Ross MRN: [id number] Date of Birth: November 20, 1945  [facility] CM/SW Contact  Quintella Suzen Jansky, RN Phone Number: 09/25/2023, 11:57 AM  Clinical Narrative:     Received call from Dorthea at [facility], patient has auth approval for [facility], [phone] and also for [facility] approval # [id number]. Facility is able to accept today. Notified bedside nurse and MD.   Expected Discharge Plan: Skilled Nursing Facility Barriers to Discharge: Continued Medical Work up  Expected Discharge Plan and Services     Post Acute Care Choice: Skilled Nursing Facility Living arrangements for the past 2 months: Single Family Home                                       Social Determinants of Health (SDOH) Interventions SDOH Screenings   Food Insecurity: No Food Insecurity (09/22/2023)  Housing: Unknown (09/22/2023)  Transportation Needs: No Transportation Needs (09/22/2023)  Utilities: Not At Risk (09/22/2023)  Financial Resource Strain: Low Risk  (05/28/2023)   Received from [facility]  Social Connections: Unknown (09/22/2023)  Tobacco Use: High Risk (09/22/2023)    Readmission Risk Interventions     No data to display

## 2023-09-25 NOTE — Plan of Care (Signed)

## 2023-09-25 NOTE — TOC Transition Note (Signed)
 Transition of Care [facility]) - Discharge Note   Patient Details  Name: Ian Ross MRN: [id number] Date of Birth: 03-28-1946  Transition of Care [facility]) CM/SW Contact:  Quintella Suzen Jansky, RN Phone Number: 09/25/2023, 1:07 PM   Clinical Narrative:     Patient to discharge today to [facility]. Patient received auth approval. Patient going to room 113, nurse to call report [phone]. TOC spoke with Octaviano at [facility] patient on the schedule for pick up. Notified MD and bedside nurse notified. Contacted son, Glendia, notified him patient to discharge to [facility] today.  Final next level of care: Skilled Nursing Facility Barriers to Discharge: Barriers Resolved   Patient Goals and CMS Choice            Discharge Placement              Patient chooses bed at: [facility] Patient to be transferred to facility by: [facility] Name of family member notified: Scott Patient and family notified of of transfer: 09/25/23  Discharge Plan and Services Additional resources added to the After Visit Summary for       Post Acute Care Choice: Skilled Nursing Facility            DME Agency: NA       HH Arranged: NA          Social Drivers of Health (SDOH) Interventions SDOH Screenings   Food Insecurity: No Food Insecurity (09/22/2023)  Housing: Unknown (09/22/2023)  Transportation Needs: No Transportation Needs (09/22/2023)  Utilities: Not At Risk (09/22/2023)  Financial Resource Strain: Low Risk  (05/28/2023)   Received from [facility]  Social Connections: Unknown (09/22/2023)  Tobacco Use: High Risk (09/22/2023)     Readmission Risk Interventions     No data to display

## 2023-09-25 NOTE — Progress Notes (Signed)
 PROGRESS NOTE    Ian Ross  FMW:[id number] DOB: 1946-04-03 DOA: 09/22/2023 PCP: Marikay Eva POUR, PA  Outpatient Specialists: vascular    Brief Narrative:   From admission h and p  Ian Ross is a 78 y.o. male with medical history significant of anxiety/depression, dementia, HTN, and prostate CA who presented on 6/21 with a fall.  He reports that he was in his usual state of health and was fixing a road grater when the bolt slipped and he lost his footing.  He fell backwards, landing on his left hip.  Pain is suboptimally controlled at this time.  No other injuries other than mild L elbow abrasion.  He drinks daily 2-6 beers.  He also smokes.       Assessment & Plan:   Principal Problem:   Hip fracture (HCC) Active Problems:   Depression   Dementia (HCC)   Tobacco abuse   Malignant neoplasm of prostate (HCC)   Gout   Alcohol abuse   Essential hypertension   Acute postoperative anemia due to expected blood loss  # Comminuted displaced left intertrochanteric hip fracture POD#3 from operative repair (intramedullary rod) by Dr. Cleotilde. Some serous/bloody drainage but this appears controlled - pt/ot advising snf, patient is reluctantly agreeable, TOC consulted - pain control, bowel regimen (stooled yesterday) - asa 81 bid for 6 wks at d/c for dvt ppx, per dr. cleotilde  # Acute blood loss anemia 2/2 fracture and surger, hgb has seems to have stabilized in the 8s - trend  # Pubic ramus fracture Management is non-operative  # Asymptomatic v-tach - f/u TTE  # AKI Mild, likely from NPO status prior to surgery. Resolved  # Heavy drinking 2-4 drinks a day, denies history withdrawal, no s/s withdrawal here - monitor  # Thrombocytopenia Mild, stable. Smear unremarkable. Hiv/hcv neg.  # Neuropathy - continue home pregabalin  # Hypertension BP wnl, doesn't appear currently taking meds at home - monitor  # History prostate cancer S/p surgery, radiation.  PSA check here is low  # Tobacco abuse - patch   DVT prophylaxis: lovenox Code Status: full Family Communication: son Glendia updated telephonically 6/23  Level of care: Med-Surg Status is: Inpatient Remains inpatient appropriate because: severity of illness    Consultants:  orthopedics  Procedures: Orif left hip  Antimicrobials:  Surgical ppx    Subjective: Reports mild hip pain, stable.   Objective: Vitals:   09/25/23 0444 09/25/23 0454 09/25/23 0711 09/25/23 0721  BP: (!) 99/56  (!) 118/56 (!) 119/50  Pulse: 80  80   Resp: 18  16   Temp: 98.7 F (37.1 C)   98.4 F (36.9 C)  TempSrc:    Oral  SpO2: 95%  94%   Weight:  84.8 kg    Height:        Intake/Output Summary (Last 24 hours) at 09/25/2023 1010 Last data filed at 09/25/2023 0135 Gross per 24 hour  Intake 960 ml  Output 1000 ml  Net -40 ml   Filed Weights   09/24/23 0500 09/25/23 0454  Weight: 84.2 kg 84.8 kg    Examination:  General exam: Appears calm and comfortable  Respiratory system: Clear to auscultation. Respiratory effort normal. Cardiovascular system: S1 & S2 heard, RRR.   Gastrointestinal system: Abdomen is nondistended, soft and nontender. No organomegaly or masses felt. Normal bowel sounds heard. Central nervous system: Alert and oriented. No focal neurological deficits. Extremities: dressing left hip, distal sensation intact Skin: No rashes, lesions or  ulcers Psychiatry: Judgement and insight appear normal. Mood & affect appropriate.     Data Reviewed: I have personally reviewed following labs and imaging studies  CBC: Recent Labs  Lab 09/22/23 0941 09/23/23 0118 09/24/23 0105 09/25/23 0405  WBC 6.4 7.3  7.4 8.9 7.4  NEUTROABS 5.1 6.6  --   --   HGB 13.3 10.3*  10.4* 8.9* 8.4*  HCT 39.4 30.9*  30.5* 26.3* 24.8*  MCV 107.7* 108.8*  106.6* 108.2* 109.7*  PLT 92* 102*  103* 100* 99*   Basic Metabolic Panel: Recent Labs  Lab 09/22/23 0941 09/23/23 0118  09/24/23 0105 09/25/23 0405  NA 136 135 135 138  K 3.8 4.0 4.6 4.3  CL 105 104 104 105  CO2 23 24 27 28   GLUCOSE 121* 161* 125* 107*  BUN 12 14 17 15   CREATININE 0.95 1.33* 1.26* 1.03  CALCIUM  8.6* 8.1* 8.7* 8.8*  MG 1.9  --   --   --    GFR: Estimated Creatinine Clearance: 66.5 mL/min (by C-G formula based on SCr of 1.03 mg/dL). Liver Function Tests: No results for input(s): AST, ALT, ALKPHOS, BILITOT, PROT, ALBUMIN in the last 168 hours. No results for input(s): LIPASE, AMYLASE in the last 168 hours. No results for input(s): AMMONIA in the last 168 hours. Coagulation Profile: Recent Labs  Lab 09/22/23 1042  INR 1.1   Cardiac Enzymes: No results for input(s): CKTOTAL, CKMB, CKMBINDEX, TROPONINI in the last 168 hours. BNP (last 3 results) No results for input(s): PROBNP in the last 8760 hours. HbA1C: No results for input(s): HGBA1C in the last 72 hours. CBG: Recent Labs  Lab 09/22/23 1331  GLUCAP 107*   Lipid Profile: No results for input(s): CHOL, HDL, LDLCALC, TRIG, CHOLHDL, LDLDIRECT in the last 72 hours. Thyroid  Function Tests: No results for input(s): TSH, T4TOTAL, FREET4, T3FREE, THYROIDAB in the last 72 hours. Anemia Panel: No results for input(s): VITAMINB12, FOLATE, FERRITIN, TIBC, IRON, RETICCTPCT in the last 72 hours. Urine analysis: No results found for: COLORURINE, APPEARANCEUR, LABSPEC, PHURINE, GLUCOSEU, HGBUR, BILIRUBINUR, KETONESUR, PROTEINUR, UROBILINOGEN, NITRITE, LEUKOCYTESUR Sepsis Labs: @LABRCNTIP (procalcitonin:4,lacticidven:4)  )No results found for this or any previous visit (from the past 240 hours).       Radiology Studies: No results found.       Scheduled Meds:  allopurinol   600 mg Oral Daily   enoxaparin (LOVENOX) injection  30 mg Subcutaneous Q24H   feeding supplement  237 mL Oral BID BM   ferrous sulfate  325 mg Oral Q breakfast    folic acid  1 mg Oral Daily   multivitamin with minerals  1 tablet Oral Daily   polyethylene glycol  17 g Oral Daily   rosuvastatin   10 mg Oral Daily   senna  1 tablet Oral BID   thiamine   100 mg Oral Daily   Or   thiamine   100 mg Intravenous Daily   traZODone   50 mg Oral QHS   Continuous Infusions:     LOS: 3 days     Devaughn KATHEE Ban, MD [facility]   If 7PM-7AM, please contact night-coverage [email or web address] Password [email or web address] 09/25/2023, 10:10 AM

## 2023-09-26 ENCOUNTER — Encounter: Payer: Self-pay | Admitting: Student

## 2023-09-26 ENCOUNTER — Non-Acute Institutional Stay (SKILLED_NURSING_FACILITY): Payer: Self-pay | Admitting: Student

## 2023-09-26 DIAGNOSIS — Z720 Tobacco use: Secondary | ICD-10-CM | POA: Diagnosis not present

## 2023-09-26 DIAGNOSIS — E538 Deficiency of other specified B group vitamins: Secondary | ICD-10-CM

## 2023-09-26 DIAGNOSIS — S72002D Fracture of unspecified part of neck of left femur, subsequent encounter for closed fracture with routine healing: Secondary | ICD-10-CM | POA: Diagnosis not present

## 2023-09-26 DIAGNOSIS — F32A Depression, unspecified: Secondary | ICD-10-CM | POA: Diagnosis not present

## 2023-09-26 DIAGNOSIS — I10 Essential (primary) hypertension: Secondary | ICD-10-CM

## 2023-09-26 DIAGNOSIS — F1010 Alcohol abuse, uncomplicated: Secondary | ICD-10-CM

## 2023-09-26 DIAGNOSIS — I70223 Atherosclerosis of native arteries of extremities with rest pain, bilateral legs: Secondary | ICD-10-CM | POA: Diagnosis not present

## 2023-09-26 DIAGNOSIS — D62 Acute posthemorrhagic anemia: Secondary | ICD-10-CM

## 2023-09-26 DIAGNOSIS — F1027 Alcohol dependence with alcohol-induced persisting dementia: Secondary | ICD-10-CM

## 2023-09-26 DIAGNOSIS — G629 Polyneuropathy, unspecified: Secondary | ICD-10-CM | POA: Diagnosis not present

## 2023-09-26 MED ORDER — PREGABALIN 100 MG PO CAPS: 100.0000 mg | ORAL_CAPSULE | Freq: Two times a day (BID) | ORAL | 0 refills | Status: DC

## 2023-09-26 MED ORDER — OXYCODONE HCL 5 MG PO TABS
5.0000 mg | ORAL_TABLET | Freq: Three times a day (TID) | ORAL | 0 refills | Status: DC | PRN
Start: 2023-09-26 — End: 2023-10-25

## 2023-09-26 NOTE — Progress Notes (Unsigned)
 Provider:  Dr. Richerd Brigham Location:  Other [facility].  Nursing Home Room Number: [facility] 113A Place of Service:  SNF (31)  PCP: Marikay Eva POUR, PA Patient Care Team: Marikay Eva POUR, [place] as PCP - General (Physician Assistant)  Extended Emergency Contact Information Primary Emergency Contact: Hachey,Phyllis Address: [address]          [place] [address] [place] Home Phone: [phone] Relation: Spouse Secondary Emergency Contact: Havard,Scott Mobile Phone: [phone] Relation: Son  Code Status: Full Code.  Goals of Care: Advanced Directive information    09/22/2023    8:58 AM  Advanced Directives  Does Patient Have a Medical Advance Directive? Yes  Type of Estate agent of [place];Living will      No chief complaint on file.   HPI: Patient is a 78 y.o. male seen today for admission to [facility].   Past Medical History:  Diagnosis Date   Anxiety    Arthritis    Chicken pox    Dementia (HCC)    Depression    Gout    Hypertension    Personal history of tobacco use, presenting hazards to health 08/06/2015   Prostate cancer [facility])    Testicular cyst    Dr. Ike   Past Surgical History:  Procedure Laterality Date   CATARACT EXTRACTION W/PHACO Right 04/21/2020   Procedure: CATARACT EXTRACTION PHACO AND INTRAOCULAR LENS PLACEMENT (IOC) RIGHT 5.74 01:01.7 9.3%;  Surgeon: Mittie Gaskin, MD;  Location: [facility];  Service: Ophthalmology;  Laterality: Right;   CATARACT EXTRACTION W/PHACO Left 05/05/2020   Procedure: CATARACT EXTRACTION PHACO AND INTRAOCULAR LENS PLACEMENT (IOC) LEFT 5.05 01:01.5 802%;  Surgeon: Mittie Gaskin, MD;  Location: [facility];  Service: Ophthalmology;  Laterality: Left;   GANGLION CYST EXCISION Right 10/08/2015   Procedure: REMOVAL GANGLION CYST FOOT/2 right foot;  Surgeon: Krystal Rosella, DPM;  Location: [facility] ORS;  Service: Podiatry;   Laterality: Right;   INTRAMEDULLARY (IM) NAIL INTERTROCHANTERIC Left 09/22/2023   Procedure: FIXATION, FRACTURE, INTERTROCHANTERIC, WITH INTRAMEDULLARY ROD;  Surgeon: Cleotilde Barrio, MD;  Location: [facility] ORS;  Service: Orthopedics;  Laterality: Left;   KNEE ARTHROSCOPY Right    Dr. Claudene, [facility]   LOWER EXTREMITY ANGIOGRAPHY Left 04/18/2019   Procedure: LOWER EXTREMITY ANGIOGRAPHY;  Surgeon: Jama Cordella MATSU, MD;  Location: [facility] INVASIVE CV LAB;  Service: Cardiovascular;  Laterality: Left;   NASAL SINUS SURGERY     Dr. Lum Gaskins, [facility]    reports that he has quit smoking. His smoking use included cigarettes. He has a 30 pack-year smoking history. His smokeless tobacco use includes chew. He reports current alcohol use of about 2.0 standard drinks of alcohol per week. He reports that he does not use drugs. Social History   Socioeconomic History   Marital status: Married    Spouse name: Not on file   Number of children: Not on file   Years of education: Not on file   Highest education level: Not on file  Occupational History   Not on file  Tobacco Use   Smoking status: Former    Current packs/day: 0.50    Average packs/day: 0.5 packs/day for 60.0 years (30.0 ttl pk-yrs)    Types: Cigarettes   Smokeless tobacco: Current    Types: Chew   Tobacco comments:    chews tobacco. started smoking age 24  Vaping Use   Vaping status: Never Used  Substance and Sexual Activity   Alcohol use: Yes  Alcohol/week: 2.0 standard drinks of alcohol    Types: 2 Cans of beer per week    Comment: daily   Drug use: No   Sexual activity: Never  Other Topics Concern   Not on file  Social History Narrative   Lives in [place], married, has 2 children (boys), 4 grandchildren.      Work - retired Holiday representative   Social Drivers of Corporate investment banker Strain: Low Risk  (05/28/2023)   Received from [employer] [facility]   Overall Financial Resource Strain (CARDIA)    Difficulty of  Paying Living Expenses: Not hard at all  Food Insecurity: No Food Insecurity (09/22/2023)   Hunger Vital Sign    Worried About Running Out of Food in the Last Year: Never true    Ran Out of Food in the Last Year: Never true  Transportation Needs: No Transportation Needs (09/22/2023)   PRAPARE - Administrator, Civil Service (Medical): No    Lack of Transportation (Non-Medical): No  Physical Activity: Not on file  Stress: Not on file  Social Connections: Unknown (09/22/2023)   Social Connection and Isolation Panel    Frequency of Communication with Friends and Family: Twice a week    Frequency of Social Gatherings with Friends and Family: Once a week    Attends Religious Services: Patient declined    Database administrator or Organizations: Patient declined    Attends Banker Meetings: Patient declined    Marital Status: Married  Catering manager Violence: Not At Risk (09/22/2023)   Humiliation, Afraid, Rape, and Kick questionnaire    Fear of Current or Ex-Partner: No    Emotionally Abused: No    Physically Abused: No    Sexually Abused: No    Functional Status Survey:    Family History  Problem Relation Age of Onset   Cancer Mother        breast   Heart disease Mother    Cancer Father        prostate   Heart disease Father    Breast cancer Neg Hx     Health Maintenance  Topic Date Due   Zoster Vaccines- Shingrix (1 of 2) Never done   COVID-19 Vaccine (3 - Moderna risk series) 07/26/2019   INFLUENZA VACCINE  11/02/2023   DTaP/Tdap/Td (3 - Td or Tdap) 04/03/2024   Medicare Annual Wellness (AWV)  05/27/2024   Lung Cancer Screening  07/22/2024   Pneumococcal Vaccine: 50+ Years  Completed   Hepatitis B Vaccines  Aged Out   HPV VACCINES  Aged Out   Meningococcal B Vaccine  Aged Out   Colonoscopy  Discontinued   Hepatitis C Screening  Discontinued    Allergies  Allergen Reactions   Codeine Nausea Only, Anxiety and Other (See Comments)     Outpatient Encounter Medications as of 09/26/2023  Medication Sig   allopurinol  (ZYLOPRIM ) 300 MG tablet Take 600 mg by mouth daily.   aspirin EC 81 MG tablet Take 1 tablet (81 mg total) by mouth 2 (two) times daily.   ferrous sulfate 325 (65 FE) MG tablet Take 1 tablet (325 mg total) by mouth every other day.   nicotine (NICODERM CQ - DOSED IN MG/24 HOURS) 14 mg/24hr patch Place 1 patch (14 mg total) onto the skin daily as needed (nicotine dependence).   oxyCODONE  (ROXICODONE ) 5 MG immediate release tablet Take 1 tablet (5 mg total) by mouth every 8 (eight) hours as needed.  polyethylene glycol (MIRALAX / GLYCOLAX) 17 g packet Take 17 g by mouth daily.   pregabalin (LYRICA) 100 MG capsule Take 100 mg by mouth 2 (two) times daily.   rosuvastatin  (CRESTOR ) 10 MG tablet Take 10 mg by mouth daily.   traZODone  (DESYREL ) 50 MG tablet Take 50 mg by mouth at bedtime.   vitamin B-12 (CYANOCOBALAMIN ) 500 MCG tablet Take 500 mcg by mouth daily.   [DISCONTINUED] cyanocobalamin  (VITAMIN B12) 500 MCG tablet Take 500 mcg by mouth daily.   [Paused] amLODipine  (NORVASC ) 5 MG tablet Take 1 tablet (5 mg total) by mouth daily. (Patient not taking: Reported on 09/26/2023)   aspirin 81 MG chewable tablet Chew 81 mg by mouth daily. (Patient not taking: Reported on 09/26/2023)   No facility-administered encounter medications on file as of 09/26/2023.    Review of Systems  Vitals:   09/26/23 0857  BP: 136/64  Pulse: 100  Resp: 20  Temp: 98.2 F (36.8 C)  SpO2: 94%  Weight: 184 lb (83.5 kg)  Height: 6' 2 (1.88 m)   Body mass index is 23.62 kg/m. Physical Exam  Labs reviewed: Basic Metabolic Panel: Recent Labs    09/22/23 0941 09/23/23 0118 09/24/23 0105 09/25/23 0405  NA 136 135 135 138  K 3.8 4.0 4.6 4.3  CL 105 104 104 105  CO2 23 24 27 28   GLUCOSE 121* 161* 125* 107*  BUN 12 14 17 15   CREATININE 0.95 1.33* 1.26* 1.03  CALCIUM  8.6* 8.1* 8.7* 8.8*  MG 1.9  --   --   --    Liver  Function Tests: No results for input(s): AST, ALT, ALKPHOS, BILITOT, PROT, ALBUMIN in the last 8760 hours. No results for input(s): LIPASE, AMYLASE in the last 8760 hours. No results for input(s): AMMONIA in the last 8760 hours. CBC: Recent Labs    09/22/23 0941 09/23/23 0118 09/24/23 0105 09/25/23 0405  WBC 6.4 7.3  7.4 8.9 7.4  NEUTROABS 5.1 6.6  --   --   HGB 13.3 10.3*  10.4* 8.9* 8.4*  HCT 39.4 30.9*  30.5* 26.3* 24.8*  MCV 107.7* 108.8*  106.6* 108.2* 109.7*  PLT 92* 102*  103* 100* 99*   Cardiac Enzymes: No results for input(s): CKTOTAL, CKMB, CKMBINDEX, TROPONINI in the last 8760 hours. BNP: Invalid input(s): POCBNP No results found for: HGBA1C Lab Results  Component Value Date   TSH 1.30 12/06/2011   Lab Results  Component Value Date   VITAMINB12 163 (L) 12/06/2011   No results found for: FOLATE Lab Results  Component Value Date   IRON 118 12/06/2011    Imaging and Procedures obtained prior to SNF admission: ECHOCARDIOGRAM COMPLETE Result Date: 09/25/2023    ECHOCARDIOGRAM REPORT   Patient Name:   LESLY JOSLYN Date of Exam: 09/24/2023 Medical Rec #:  [id number]        Height:       74.0 in Accession #:    [id number]       Weight:       185.6 lb Date of Birth:  16-Oct-1945        BSA:          2.106 m Patient Age:    78 years         BP:           146/91 mmHg Patient Gender: M                HR:  105 bpm. Exam Location:  [facility] Procedure: 2D Echo, Cardiac Doppler and Color Doppler (Both Spectral and Color            Flow Doppler were utilized during procedure). Indications:     Ventricular Tachycardia I47.2  History:         Patient has no prior history of Echocardiogram examinations.                  Risk Factors:Hypertension.  Sonographer:     Bari Roar Referring Phys:  [id number] DEVAUGHN SAYRES TNLX Diagnosing Phys: Lonni Hanson MD IMPRESSIONS  1. Left ventricular ejection fraction, by estimation, is 65 to 70%. The left  ventricle has normal function. Left ventricular endocardial border not optimally defined to evaluate regional wall motion. There is mild left ventricular hypertrophy. Left ventricular diastolic parameters are consistent with Grade I diastolic dysfunction (impaired relaxation).  2. Right ventricular systolic function is normal. The right ventricular size is normal. There is moderately elevated pulmonary artery systolic pressure.  3. The mitral valve was not well visualized. No evidence of mitral valve regurgitation. No evidence of mitral stenosis.  4. The aortic valve was not well visualized. Aortic valve regurgitation is not visualized. No aortic stenosis is present.  5. The inferior vena cava is dilated in size with <50% respiratory variability, suggesting right atrial pressure of 15 mmHg. FINDINGS  Left Ventricle: Left ventricular ejection fraction, by estimation, is 65 to 70%. The left ventricle has normal function. Left ventricular endocardial border not optimally defined to evaluate regional wall motion. The left ventricular internal cavity size was normal in size. There is mild left ventricular hypertrophy. Left ventricular diastolic parameters are consistent with Grade I diastolic dysfunction (impaired relaxation). Right Ventricle: The right ventricular size is normal. No increase in right ventricular wall thickness. Right ventricular systolic function is normal. There is moderately elevated pulmonary artery systolic pressure. The tricuspid regurgitant velocity is 2.79 m/s, and with an assumed right atrial pressure of 15 mmHg, the estimated right ventricular systolic pressure is 46.1 mmHg. Left Atrium: Left atrial size was normal in size. Right Atrium: Right atrial size was normal in size. Pericardium: There is no evidence of pericardial effusion. Mitral Valve: The mitral valve was not well visualized. No evidence of mitral valve regurgitation. No evidence of mitral valve stenosis. MV peak gradient, 6.7 mmHg.  The mean mitral valve gradient is 3.0 mmHg. Tricuspid Valve: The tricuspid valve is grossly normal. Tricuspid valve regurgitation is trivial. Aortic Valve: The aortic valve was not well visualized. Aortic valve regurgitation is not visualized. No aortic stenosis is present. Aortic valve mean gradient measures 4.0 mmHg. Aortic valve peak gradient measures 8.1 mmHg. Aortic valve area, by VTI measures 2.34 cm. Pulmonic Valve: The pulmonic valve was not well visualized. Pulmonic valve regurgitation is not visualized. No evidence of pulmonic stenosis. Aorta: The aortic root and ascending aorta are structurally normal, with no evidence of dilitation. Pulmonary Artery: The pulmonary artery is not well seen. Venous: The inferior vena cava is dilated in size with less than 50% respiratory variability, suggesting right atrial pressure of 15 mmHg. IAS/Shunts: No atrial level shunt detected by color flow Doppler.  LEFT VENTRICLE PLAX 2D LVIDd:         4.80 cm   Diastology LVIDs:         3.10 cm   LV e' medial:    8.70 cm/s LV PW:         1.00 cm   LV E/e' medial:  10.0 LV IVS:        1.20 cm   LV e' lateral:   8.86 cm/s LVOT diam:     1.90 cm   LV E/e' lateral: 9.8 LV SV:         55 LV SV Index:   26 LVOT Area:     2.84 cm  RIGHT VENTRICLE RV Basal diam:  3.00 cm RV Mid diam:    2.60 cm RV S prime:     19.60 cm/s TAPSE (M-mode): 2.2 cm LEFT ATRIUM             Index        RIGHT ATRIUM           Index LA diam:        3.20 cm 1.52 cm/m   RA Area:     18.00 cm LA Vol (A2C):   42.3 ml 20.09 ml/m  RA Volume:   46.80 ml  22.23 ml/m LA Vol (A4C):   40.8 ml 19.38 ml/m LA Biplane Vol: 41.4 ml 19.66 ml/m  AORTIC VALVE                    PULMONIC VALVE AV Area (Vmax):    2.26 cm     PV Vmax:        1.36 m/s AV Area (Vmean):   2.36 cm     PV Peak grad:   7.4 mmHg AV Area (VTI):     2.34 cm     RVOT Peak grad: 4 mmHg AV Vmax:           142.00 cm/s AV Vmean:          95.300 cm/s AV VTI:            0.235 m AV Peak Grad:      8.1  mmHg AV Mean Grad:      4.0 mmHg LVOT Vmax:         113.00 cm/s LVOT Vmean:        79.200 cm/s LVOT VTI:          0.194 m LVOT/AV VTI ratio: 0.83  AORTA Ao Root diam: 3.50 cm Ao Asc diam:  3.60 cm MITRAL VALVE                TRICUSPID VALVE MV Area (PHT): 6.96 cm     TR Peak grad:   31.1 mmHg MV Area VTI:   2.92 cm     TR Vmax:        279.00 cm/s MV Peak grad:  6.7 mmHg MV Mean grad:  3.0 mmHg     SHUNTS MV Vmax:       1.29 m/s     Systemic VTI:  0.19 m MV Vmean:      73.1 cm/s    Systemic Diam: 1.90 cm MV Decel Time: 109 msec MV E velocity: 87.00 cm/s MV A velocity: 114.00 cm/s MV E/A ratio:  0.76 MV A Prime:    20.0 cm/s Lonni Hanson MD Electronically signed by Lonni Hanson MD Signature Date/Time: 09/25/2023/1:58:50 PM    Final     Assessment/Plan There are no diagnoses linked to this encounter.   Family/ staff Communication:   Labs/tests ordered:

## 2023-09-27 ENCOUNTER — Encounter: Payer: Self-pay | Admitting: Student

## 2023-09-27 DIAGNOSIS — M1A09X1 Idiopathic chronic gout, multiple sites, with tophus (tophi): Secondary | ICD-10-CM | POA: Insufficient documentation

## 2023-09-27 DIAGNOSIS — M1990 Unspecified osteoarthritis, unspecified site: Secondary | ICD-10-CM | POA: Insufficient documentation

## 2023-09-27 DIAGNOSIS — R209 Unspecified disturbances of skin sensation: Secondary | ICD-10-CM | POA: Insufficient documentation

## 2023-09-27 LAB — CBC AND DIFFERENTIAL
HCT: 27 — AB (ref 41–53)
Hemoglobin: 8.7 — AB (ref 13.5–17.5)
Neutrophils Absolute: 3217
Platelets: 128 K/uL — AB (ref 150–400)
WBC: 5.3

## 2023-09-27 LAB — CBC: RBC: 2.39 — AB (ref 3.87–5.11)

## 2023-09-27 LAB — COMPREHENSIVE METABOLIC PANEL WITH GFR
Calcium: 8.6 — AB (ref 8.7–10.7)
eGFR: 79

## 2023-09-27 LAB — BASIC METABOLIC PANEL WITH GFR
BUN: 12 (ref 4–21)
CO2: 31 — AB (ref 13–22)
Chloride: 102 (ref 99–108)
Creatinine: 1 (ref 0.6–1.3)
Glucose: 96
Potassium: 4 meq/L (ref 3.5–5.1)
Sodium: 139 (ref 137–147)

## 2023-10-08 ENCOUNTER — Other Ambulatory Visit: Payer: Self-pay | Admitting: Adult Health

## 2023-10-08 DIAGNOSIS — G629 Polyneuropathy, unspecified: Secondary | ICD-10-CM

## 2023-10-08 MED ORDER — PREGABALIN 100 MG PO CAPS: 100.0000 mg | ORAL_CAPSULE | Freq: Two times a day (BID) | ORAL | 0 refills | Status: DC

## 2023-10-10 ENCOUNTER — Other Ambulatory Visit: Payer: Self-pay | Admitting: Adult Health

## 2023-10-10 DIAGNOSIS — G629 Polyneuropathy, unspecified: Secondary | ICD-10-CM

## 2023-10-10 MED ORDER — PREGABALIN 100 MG PO CAPS: 100.0000 mg | ORAL_CAPSULE | Freq: Two times a day (BID) | ORAL | 0 refills | Status: AC

## 2023-10-17 DIAGNOSIS — S72352A Displaced comminuted fracture of shaft of left femur, initial encounter for closed fracture: Secondary | ICD-10-CM | POA: Diagnosis not present

## 2023-10-17 DIAGNOSIS — Z471 Aftercare following joint replacement surgery: Secondary | ICD-10-CM | POA: Diagnosis not present

## 2023-10-25 ENCOUNTER — Encounter: Payer: Self-pay | Admitting: Nurse Practitioner

## 2023-10-25 ENCOUNTER — Non-Acute Institutional Stay (SKILLED_NURSING_FACILITY): Payer: Self-pay | Admitting: Nurse Practitioner

## 2023-10-25 DIAGNOSIS — F32A Depression, unspecified: Secondary | ICD-10-CM

## 2023-10-25 DIAGNOSIS — I10 Essential (primary) hypertension: Secondary | ICD-10-CM | POA: Diagnosis not present

## 2023-10-25 DIAGNOSIS — S72002D Fracture of unspecified part of neck of left femur, subsequent encounter for closed fracture with routine healing: Secondary | ICD-10-CM | POA: Diagnosis not present

## 2023-10-25 DIAGNOSIS — D62 Acute posthemorrhagic anemia: Secondary | ICD-10-CM | POA: Diagnosis not present

## 2023-10-25 DIAGNOSIS — F1027 Alcohol dependence with alcohol-induced persisting dementia: Secondary | ICD-10-CM | POA: Diagnosis not present

## 2023-10-25 DIAGNOSIS — G629 Polyneuropathy, unspecified: Secondary | ICD-10-CM | POA: Diagnosis not present

## 2023-10-25 NOTE — Progress Notes (Signed)
 Location:  Other [facility].  Nursing Home Room Number: [facility] 113A Place of Service:  SNF [phone]) Harlene An, NP  PCP: Marikay Eva POUR, [place]  Patient Care Team: Marikay Eva POUR, [place] as PCP - General (Physician Assistant)  Extended Emergency Contact Information Primary Emergency Contact: Baugh,Phyllis Address: [address]          [place] [address] [place] Home Phone: [phone] Relation: Spouse Secondary Emergency Contact: Landsberg,Scott Mobile Phone: [phone] Relation: Son  Goals of care: Advanced Directive information    09/22/2023    8:58 AM  Advanced Directives  Does Patient Have a Medical Advance Directive? Yes  Type of Estate agent of [place];Living will     Chief Complaint  Patient presents with   Discharge Note    Discharge.     HPI:  Pt is a 78 y.o. male seen today for Discharge.  Pt with hx of nxiety/depression, dementia, HTN, and prostate CA  [facility] after left hip fracture s/p IM rod placement on 6/21 Also noted to have pubic ramus fracture managed non-operative.  He has followed up with orthopedics.  Incision healing well No significant pain- using tylenol  PRN He is to continue on ASA 81 mg BID for 6 weeks.   He was noted to have acute blood loss anemia and has been on iron.    Past Medical History:  Diagnosis Date   Anxiety    Arthritis    Chicken pox    Dementia (HCC)    Depression    Gout    Hypertension    Personal history of tobacco use, presenting hazards to health 08/06/2015   Prostate cancer [facility])    Testicular cyst    Dr. Ike   Past Surgical History:  Procedure Laterality Date   CATARACT EXTRACTION W/PHACO Right 04/21/2020   Procedure: CATARACT EXTRACTION PHACO AND INTRAOCULAR LENS PLACEMENT (IOC) RIGHT 5.74 01:01.7 9.3%;  Surgeon: Mittie Gaskin, MD;  Location: [facility];  Service: Ophthalmology;  Laterality:  Right;   CATARACT EXTRACTION W/PHACO Left 05/05/2020   Procedure: CATARACT EXTRACTION PHACO AND INTRAOCULAR LENS PLACEMENT (IOC) LEFT 5.05 01:01.5 802%;  Surgeon: Mittie Gaskin, MD;  Location: [facility];  Service: Ophthalmology;  Laterality: Left;   GANGLION CYST EXCISION Right 10/08/2015   Procedure: REMOVAL GANGLION CYST FOOT/2 right foot;  Surgeon: Krystal Rosella, DPM;  Location: [facility] ORS;  Service: Podiatry;  Laterality: Right;   INTRAMEDULLARY (IM) NAIL INTERTROCHANTERIC Left 09/22/2023   Procedure: FIXATION, FRACTURE, INTERTROCHANTERIC, WITH INTRAMEDULLARY ROD;  Surgeon: Cleotilde Barrio, MD;  Location: [facility] ORS;  Service: Orthopedics;  Laterality: Left;   KNEE ARTHROSCOPY Right    Dr. Claudene, [facility]   LOWER EXTREMITY ANGIOGRAPHY Left 04/18/2019   Procedure: LOWER EXTREMITY ANGIOGRAPHY;  Surgeon: Jama Cordella MATSU, MD;  Location: [facility] INVASIVE CV LAB;  Service: Cardiovascular;  Laterality: Left;   NASAL SINUS SURGERY     Dr. Lum Gaskins, [facility]    Allergies  Allergen Reactions   Codeine Nausea Only, Anxiety and Other (See Comments)    Outpatient Encounter Medications as of 10/25/2023  Medication Sig   allopurinol  (ZYLOPRIM ) 300 MG tablet Take 600 mg by mouth daily.   aspirin  EC 81 MG tablet Take 1 tablet (81 mg total) by mouth 2 (two) times daily.   ferrous sulfate  325 (65 FE) MG tablet Take 1 tablet (325 mg total) by mouth every other day.   nicotine  (NICODERM CQ  - DOSED  IN MG/24 HOURS) 14 mg/24hr patch Place 1 patch (14 mg total) onto the skin daily as needed (nicotine  dependence).   oxyCODONE  (ROXICODONE ) 5 MG immediate release tablet Take 1 tablet (5 mg total) by mouth every 8 (eight) hours as needed. (Patient taking differently: Take 5 mg by mouth every 12 (twelve) hours as needed.)   polyethylene glycol (MIRALAX  / GLYCOLAX ) 17 g packet Take 17 g by mouth daily.   pregabalin  (LYRICA ) 100 MG capsule Take 1 capsule (100 mg total) by mouth 2 (two) times daily.   rosuvastatin   (CRESTOR ) 10 MG tablet Take 10 mg by mouth daily.   vitamin B-12 (CYANOCOBALAMIN ) 500 MCG tablet Take 500 mcg by mouth daily.   [Paused] amLODipine  (NORVASC ) 5 MG tablet Take 1 tablet (5 mg total) by mouth daily. (Patient not taking: Reported on 10/25/2023)   aspirin  81 MG chewable tablet Chew 81 mg by mouth daily. (Patient not taking: Reported on 10/25/2023)   traZODone  (DESYREL ) 50 MG tablet Take 50 mg by mouth at bedtime. (Patient not taking: Reported on 10/25/2023)   No facility-administered encounter medications on file as of 10/25/2023.    Review of Systems  Constitutional:  Negative for activity change, appetite change, fatigue and unexpected weight change.  HENT:  Negative for congestion and hearing loss.   Eyes: Negative.   Respiratory:  Negative for cough and shortness of breath.   Cardiovascular:  Negative for chest pain, palpitations and leg swelling.  Gastrointestinal:  Negative for abdominal pain, constipation and diarrhea.  Genitourinary:  Negative for difficulty urinating and dysuria.  Musculoskeletal:  Negative for arthralgias and myalgias.  Skin:  Negative for color change and wound.  Neurological:  Negative for dizziness and weakness.  Psychiatric/Behavioral:  Negative for agitation, behavioral problems and confusion.      Immunization History  Administered Date(s) Administered   Influenza Split 01/16/2012   Influenza Whole 01/13/2010   Influenza,inj,Quad PF,6+ Mos 04/09/2014, 01/14/2015   Influenza-Unspecified 02/12/2013, 04/09/2014, 01/14/2015   Moderna Sars-Covid-2 Vaccination 05/31/2019, 06/28/2019   Pneumococcal Conjugate-13 05/15/2013   Pneumococcal Polysaccharide-23 12/06/2010   Tdap 12/06/2011, 04/03/2014   Pertinent  Health Maintenance Due  Topic Date Due   INFLUENZA VACCINE  11/02/2023   Colonoscopy  Discontinued      09/13/2018   11:20 AM 09/14/2018    7:42 AM 04/18/2019    7:25 AM 04/21/2020    8:28 AM 05/05/2020    9:54 AM  Fall Risk  (RETIRED)  Patient Fall Risk Level Low fall risk  Low fall risk  High fall risk  Low fall risk  Low fall risk      Data saved with a previous flowsheet row definition   Functional Status Survey:    Vitals:   10/25/23 1139  BP: 133/70  Pulse: 72  Resp: 18  Temp: (!) 97.2 F (36.2 C)  SpO2: 97%  Weight: 175 lb (79.4 kg)  Height: 6' 2 (1.88 m)   Body mass index is 22.47 kg/m. Physical Exam Constitutional:      General: He is not in acute distress.    Appearance: He is well-developed. He is not diaphoretic.  HENT:     Head: Normocephalic and atraumatic.     Right Ear: External ear normal.     Left Ear: External ear normal.     Mouth/Throat:     Pharynx: No oropharyngeal exudate.  Eyes:     Conjunctiva/sclera: Conjunctivae normal.     Pupils: Pupils are equal, round, and reactive to light.  Cardiovascular:  Rate and Rhythm: Normal rate and regular rhythm.     Heart sounds: Normal heart sounds.  Pulmonary:     Effort: Pulmonary effort is normal.     Breath sounds: Normal breath sounds.  Abdominal:     General: Bowel sounds are normal.     Palpations: Abdomen is soft.  Musculoskeletal:        General: Tenderness (to left hip) present.     Cervical back: Normal range of motion and neck supple.     Right lower leg: No edema.     Left lower leg: No edema.  Skin:    General: Skin is warm and dry.  Neurological:     Mental Status: He is alert and oriented to person, place, and time.     Labs reviewed: Recent Labs    09/22/23 0941 09/23/23 0118 09/24/23 0105 09/25/23 0405 09/27/23 0000  NA 136 135 135 138 139  K 3.8 4.0 4.6 4.3 4.0  CL 105 104 104 105 102  CO2 23 24 27 28  31*  GLUCOSE 121* 161* 125* 107*  --   BUN 12 14 17 15 12   CREATININE 0.95 1.33* 1.26* 1.03 1.0  CALCIUM  8.6* 8.1* 8.7* 8.8* 8.6*  MG 1.9  --   --   --   --    No results for input(s): AST, ALT, ALKPHOS, BILITOT, PROT, ALBUMIN in the last 8760 hours. Recent Labs    09/22/23 0941  09/23/23 0118 09/24/23 0105 09/25/23 0405 09/27/23 0000  WBC 6.4 7.3  7.4 8.9 7.4 5.3  NEUTROABS 5.1 6.6  --   --  3,217.00  HGB 13.3 10.3*  10.4* 8.9* 8.4* 8.7*  HCT 39.4 30.9*  30.5* 26.3* 24.8* 27*  MCV 107.7* 108.8*  106.6* 108.2* 109.7*  --   PLT 92* 102*  103* 100* 99* 128*   Lab Results  Component Value Date   TSH 1.30 12/06/2011   No results found for: HGBA1C Lab Results  Component Value Date   CHOL 182 05/05/2015   HDL 58.10 05/05/2015   LDLCALC 78 05/15/2013   LDLDIRECT 91.0 05/05/2015   TRIG 224.0 (H) 05/05/2015   CHOLHDL 3 05/05/2015    Significant Diagnostic Results in last 30 days:  No results found.  Assessment/Plan 1. Closed fracture of left hip with routine healing, subsequent encounter Pain well controlled, doing well with PT/OT will continue outpatient -continues on ASA 81 mg BID for 6 weeks total   2. Neuropathy (HCC) Stable, continues on home lyrica   3. Mild dementia associated with alcoholism, unspecified whether behavioral, psychotic, or mood disturbance or anxiety (HCC) (Primary) Continue supportive care   4. Depression, unspecified depression type Stable, not currently taking medication   5. Essential hypertension Controlled, not currently on medication  6. Acute postoperative anemia due to expected blood loss Continues on iron, will need CBC completed by PCP on follow up.    Glendia Olshefski K. Caro BODILY [facility] [phone]

## 2023-11-09 ENCOUNTER — Telehealth: Payer: Self-pay

## 2023-11-09 NOTE — Telephone Encounter (Signed)
 Copied from CRM [id number]. Topic: Clinical - Home Health Verbal Orders >> Nov 09, 2023  9:27 AM Miquel SAILOR wrote: Caller/Agency: [facility] from [facility]  Callback Number: [phone] Qnm:[facility]-It shows for both but collin needs call back if it is only 1 or both need to be done. Scheduled for 08/11 OT Frequency:  Any new concerns about the patient? No

## 2023-11-09 NOTE — Telephone Encounter (Signed)
 This call should've never been taken and routed our office, as this is a resident in a skilled nursing facility and all communication is handled directly through the facility for continuity of care. Call was returned to [facility] and I provided them with the number to [facility] (where patient seen) [phone].

## 2024-02-25 DIAGNOSIS — E782 Mixed hyperlipidemia: Secondary | ICD-10-CM | POA: Diagnosis not present

## 2024-02-25 DIAGNOSIS — I10 Essential (primary) hypertension: Secondary | ICD-10-CM | POA: Diagnosis not present

## 2024-02-25 DIAGNOSIS — Z131 Encounter for screening for diabetes mellitus: Secondary | ICD-10-CM | POA: Diagnosis not present

## 2024-03-03 DIAGNOSIS — E559 Vitamin D deficiency, unspecified: Secondary | ICD-10-CM | POA: Diagnosis not present

## 2024-03-03 DIAGNOSIS — R911 Solitary pulmonary nodule: Secondary | ICD-10-CM | POA: Diagnosis not present

## 2024-03-03 DIAGNOSIS — S72002D Fracture of unspecified part of neck of left femur, subsequent encounter for closed fracture with routine healing: Secondary | ICD-10-CM | POA: Diagnosis not present

## 2024-03-03 DIAGNOSIS — F325 Major depressive disorder, single episode, in full remission: Secondary | ICD-10-CM | POA: Diagnosis not present

## 2024-03-03 DIAGNOSIS — Z720 Tobacco use: Secondary | ICD-10-CM | POA: Diagnosis not present

## 2024-03-03 DIAGNOSIS — M1A9XX0 Chronic gout, unspecified, without tophus (tophi): Secondary | ICD-10-CM | POA: Diagnosis not present

## 2024-03-03 DIAGNOSIS — E538 Deficiency of other specified B group vitamins: Secondary | ICD-10-CM | POA: Diagnosis not present

## 2024-03-03 DIAGNOSIS — R799 Abnormal finding of blood chemistry, unspecified: Secondary | ICD-10-CM | POA: Diagnosis not present

## 2024-03-03 DIAGNOSIS — E782 Mixed hyperlipidemia: Secondary | ICD-10-CM | POA: Diagnosis not present

## 2024-03-03 DIAGNOSIS — G629 Polyneuropathy, unspecified: Secondary | ICD-10-CM | POA: Diagnosis not present

## 2024-03-03 DIAGNOSIS — C61 Malignant neoplasm of prostate: Secondary | ICD-10-CM | POA: Diagnosis not present

## 2024-03-03 DIAGNOSIS — I10 Essential (primary) hypertension: Secondary | ICD-10-CM | POA: Diagnosis not present
